# Patient Record
Sex: Female | Born: 1940 | Race: White | Hispanic: Yes | Marital: Married | Smoking: Never smoker
Health system: Southern US, Community
[De-identification: ages and names within clinical notes are randomized; demographics above are authoritative.]

## PROBLEM LIST (undated history)

## (undated) DIAGNOSIS — C8590 Non-Hodgkin lymphoma, unspecified, unspecified site: Secondary | ICD-10-CM

## (undated) DIAGNOSIS — I272 Pulmonary hypertension, unspecified: Secondary | ICD-10-CM

## (undated) DIAGNOSIS — N2581 Secondary hyperparathyroidism of renal origin: Secondary | ICD-10-CM

## (undated) DIAGNOSIS — N186 End stage renal disease: Secondary | ICD-10-CM

## (undated) DIAGNOSIS — I10 Essential (primary) hypertension: Secondary | ICD-10-CM

## (undated) DIAGNOSIS — C679 Malignant neoplasm of bladder, unspecified: Secondary | ICD-10-CM

## (undated) DIAGNOSIS — IMO0001 Reserved for inherently not codable concepts without codable children: Secondary | ICD-10-CM

## (undated) DIAGNOSIS — H547 Unspecified visual loss: Secondary | ICD-10-CM

## (undated) DIAGNOSIS — K769 Liver disease, unspecified: Secondary | ICD-10-CM

## (undated) DIAGNOSIS — IMO0002 Reserved for concepts with insufficient information to code with codable children: Secondary | ICD-10-CM

## (undated) DIAGNOSIS — Z9289 Personal history of other medical treatment: Secondary | ICD-10-CM

## (undated) DIAGNOSIS — Z992 Dependence on renal dialysis: Secondary | ICD-10-CM

## (undated) DIAGNOSIS — K5900 Constipation, unspecified: Secondary | ICD-10-CM

## (undated) DIAGNOSIS — I4891 Unspecified atrial fibrillation: Secondary | ICD-10-CM

## (undated) HISTORY — PX: COLONOSCOPY: SHX174

## (undated) HISTORY — DX: Unspecified atrial fibrillation: I48.91

## (undated) HISTORY — DX: Unspecified visual loss: H54.7

## (undated) HISTORY — DX: Non-Hodgkin lymphoma, unspecified, unspecified site: C85.90

## (undated) HISTORY — PX: AV FISTULA PLACEMENT: SHX1204

## (undated) HISTORY — PX: INSERTION OF DIALYSIS CATHETER: SHX1324

## (undated) HISTORY — PX: FINGER SURGERY: SHX640

## (undated) HISTORY — DX: Pulmonary hypertension, unspecified: I27.20

---

## 2003-02-12 DIAGNOSIS — H547 Unspecified visual loss: Secondary | ICD-10-CM

## 2003-02-12 HISTORY — DX: Unspecified visual loss: H54.7

## 2005-02-11 DIAGNOSIS — C8590 Non-Hodgkin lymphoma, unspecified, unspecified site: Secondary | ICD-10-CM

## 2005-02-11 HISTORY — DX: Non-Hodgkin lymphoma, unspecified, unspecified site: C85.90

## 2005-06-07 ENCOUNTER — Encounter: Payer: Self-pay | Admitting: Cardiology

## 2005-06-07 ENCOUNTER — Inpatient Hospital Stay (HOSPITAL_COMMUNITY): Admission: EM | Admit: 2005-06-07 | Discharge: 2005-06-12 | Payer: Self-pay | Admitting: Family Medicine

## 2005-06-07 ENCOUNTER — Ambulatory Visit: Payer: Self-pay | Admitting: Cardiology

## 2005-06-11 ENCOUNTER — Encounter (INDEPENDENT_AMBULATORY_CARE_PROVIDER_SITE_OTHER): Payer: Self-pay | Admitting: *Deleted

## 2005-07-11 ENCOUNTER — Inpatient Hospital Stay (HOSPITAL_COMMUNITY): Admission: EM | Admit: 2005-07-11 | Discharge: 2005-07-14 | Payer: Self-pay | Admitting: Emergency Medicine

## 2005-07-26 ENCOUNTER — Encounter: Payer: Self-pay | Admitting: Vascular Surgery

## 2005-07-26 ENCOUNTER — Inpatient Hospital Stay (HOSPITAL_COMMUNITY): Admission: EM | Admit: 2005-07-26 | Discharge: 2005-08-06 | Payer: Self-pay | Admitting: Internal Medicine

## 2005-07-30 ENCOUNTER — Encounter: Payer: Self-pay | Admitting: Vascular Surgery

## 2005-10-26 ENCOUNTER — Emergency Department (HOSPITAL_COMMUNITY): Admission: EM | Admit: 2005-10-26 | Discharge: 2005-10-26 | Payer: Self-pay | Admitting: Emergency Medicine

## 2005-10-29 ENCOUNTER — Emergency Department (HOSPITAL_COMMUNITY): Admission: EM | Admit: 2005-10-29 | Discharge: 2005-10-29 | Payer: Self-pay | Admitting: Emergency Medicine

## 2005-11-18 ENCOUNTER — Ambulatory Visit (HOSPITAL_COMMUNITY): Admission: RE | Admit: 2005-11-18 | Discharge: 2005-11-18 | Payer: Self-pay | Admitting: Vascular Surgery

## 2005-12-23 ENCOUNTER — Emergency Department (HOSPITAL_COMMUNITY): Admission: EM | Admit: 2005-12-23 | Discharge: 2005-12-23 | Payer: Self-pay | Admitting: Emergency Medicine

## 2006-01-10 ENCOUNTER — Ambulatory Visit (HOSPITAL_COMMUNITY): Admission: RE | Admit: 2006-01-10 | Discharge: 2006-01-10 | Payer: Self-pay | Admitting: Urology

## 2006-01-10 ENCOUNTER — Encounter (INDEPENDENT_AMBULATORY_CARE_PROVIDER_SITE_OTHER): Payer: Self-pay | Admitting: *Deleted

## 2006-01-10 DIAGNOSIS — C679 Malignant neoplasm of bladder, unspecified: Secondary | ICD-10-CM

## 2006-01-10 HISTORY — DX: Malignant neoplasm of bladder, unspecified: C67.9

## 2006-01-25 ENCOUNTER — Ambulatory Visit: Payer: Self-pay | Admitting: Oncology

## 2006-03-20 ENCOUNTER — Ambulatory Visit: Payer: Self-pay | Admitting: Oncology

## 2008-03-04 ENCOUNTER — Other Ambulatory Visit: Admission: RE | Admit: 2008-03-04 | Discharge: 2008-03-04 | Payer: Self-pay | Admitting: Gynecology

## 2008-03-04 ENCOUNTER — Ambulatory Visit: Payer: Self-pay | Admitting: Gynecology

## 2008-03-04 ENCOUNTER — Encounter: Payer: Self-pay | Admitting: Gynecology

## 2008-03-14 ENCOUNTER — Ambulatory Visit: Payer: Self-pay | Admitting: Gynecology

## 2008-04-12 ENCOUNTER — Ambulatory Visit: Payer: Self-pay | Admitting: Oncology

## 2008-04-18 LAB — CBC WITH DIFFERENTIAL/PLATELET
BASO%: 0.4 % (ref 0.0–2.0)
LYMPH%: 31.8 % (ref 14.0–49.7)
MCHC: 33.8 g/dL (ref 31.5–36.0)
MCV: 98.6 fL (ref 79.5–101.0)
MONO%: 9.5 % (ref 0.0–14.0)
Platelets: 259 10*3/uL (ref 145–400)
RBC: 3.9 10*6/uL (ref 3.70–5.45)
WBC: 5.2 10*3/uL (ref 3.9–10.3)

## 2008-04-19 ENCOUNTER — Ambulatory Visit: Admission: RE | Admit: 2008-04-19 | Discharge: 2008-06-23 | Payer: Self-pay | Admitting: Radiation Oncology

## 2008-05-24 LAB — CBC WITH DIFFERENTIAL/PLATELET
Basophils Absolute: 0 10*3/uL (ref 0.0–0.1)
Eosinophils Absolute: 0.1 10*3/uL (ref 0.0–0.5)
HCT: 28.8 % — ABNORMAL LOW (ref 34.8–46.6)
HGB: 9.9 g/dL — ABNORMAL LOW (ref 11.6–15.9)
MCV: 99.5 fL (ref 79.5–101.0)
MONO%: 10.4 % (ref 0.0–14.0)
NEUT#: 2.2 10*3/uL (ref 1.5–6.5)
NEUT%: 60.1 % (ref 38.4–76.8)
Platelets: 198 10*3/uL (ref 145–400)
RDW: 15.3 % — ABNORMAL HIGH (ref 11.2–14.5)

## 2008-06-01 LAB — CBC WITH DIFFERENTIAL/PLATELET
Basophils Absolute: 0 10*3/uL (ref 0.0–0.1)
Eosinophils Absolute: 0.1 10*3/uL (ref 0.0–0.5)
LYMPH%: 19.2 % (ref 14.0–49.7)
MCH: 33.7 pg (ref 25.1–34.0)
MCV: 99.2 fL (ref 79.5–101.0)
MONO%: 10.3 % (ref 0.0–14.0)
NEUT#: 2.1 10*3/uL (ref 1.5–6.5)
Platelets: 177 10*3/uL (ref 145–400)
RBC: 3.56 10*6/uL — ABNORMAL LOW (ref 3.70–5.45)

## 2008-06-08 ENCOUNTER — Ambulatory Visit: Payer: Self-pay | Admitting: Oncology

## 2008-06-29 LAB — CBC WITH DIFFERENTIAL/PLATELET
BASO%: 0.9 % (ref 0.0–2.0)
EOS%: 4.8 % (ref 0.0–7.0)
HCT: 38.3 % (ref 34.8–46.6)
MCH: 32.8 pg (ref 25.1–34.0)
MCHC: 33.7 g/dL (ref 31.5–36.0)
MONO#: 0.4 10*3/uL (ref 0.1–0.9)
NEUT%: 64.5 % (ref 38.4–76.8)
RBC: 3.93 10*6/uL (ref 3.70–5.45)
RDW: 15.3 % — ABNORMAL HIGH (ref 11.2–14.5)
WBC: 3.4 10*3/uL — ABNORMAL LOW (ref 3.9–10.3)
lymph#: 0.6 10*3/uL — ABNORMAL LOW (ref 0.9–3.3)
nRBC: 0 % (ref 0–0)

## 2008-06-29 LAB — COMPREHENSIVE METABOLIC PANEL
ALT: 12 U/L (ref 0–35)
AST: 19 U/L (ref 0–37)
Alkaline Phosphatase: 108 U/L (ref 39–117)
Creatinine, Ser: 5.57 mg/dL — ABNORMAL HIGH (ref 0.40–1.20)
Sodium: 140 mEq/L (ref 135–145)
Total Bilirubin: 0.5 mg/dL (ref 0.3–1.2)
Total Protein: 7.2 g/dL (ref 6.0–8.3)

## 2008-06-29 LAB — LACTATE DEHYDROGENASE: LDH: 238 U/L (ref 94–250)

## 2008-10-31 ENCOUNTER — Ambulatory Visit: Payer: Self-pay | Admitting: Oncology

## 2008-11-02 LAB — CBC WITH DIFFERENTIAL/PLATELET
BASO%: 0.3 % (ref 0.0–2.0)
EOS%: 3.3 % (ref 0.0–7.0)
LYMPH%: 17.5 % (ref 14.0–49.7)
MCH: 34.2 pg — ABNORMAL HIGH (ref 25.1–34.0)
MCHC: 35.1 g/dL (ref 31.5–36.0)
MONO#: 0.4 10*3/uL (ref 0.1–0.9)
Platelets: 204 10*3/uL (ref 145–400)
RBC: 3.59 10*6/uL — ABNORMAL LOW (ref 3.70–5.45)
WBC: 4.2 10*3/uL (ref 3.9–10.3)

## 2009-04-27 ENCOUNTER — Ambulatory Visit: Payer: Self-pay | Admitting: Oncology

## 2009-05-01 LAB — CBC WITH DIFFERENTIAL/PLATELET
Eosinophils Absolute: 0.2 10*3/uL (ref 0.0–0.5)
LYMPH%: 21.3 % (ref 14.0–49.7)
MONO#: 0.4 10*3/uL (ref 0.1–0.9)
NEUT#: 2.8 10*3/uL (ref 1.5–6.5)
Platelets: 220 10*3/uL (ref 145–400)
RBC: 3.18 10*6/uL — ABNORMAL LOW (ref 3.70–5.45)
WBC: 4.3 10*3/uL (ref 3.9–10.3)
lymph#: 0.9 10*3/uL (ref 0.9–3.3)

## 2009-05-01 LAB — VITAMIN D 25 HYDROXY (VIT D DEFICIENCY, FRACTURES): Vit D, 25-Hydroxy: 28 ng/mL — ABNORMAL LOW (ref 30–89)

## 2009-10-30 ENCOUNTER — Ambulatory Visit: Payer: Self-pay | Admitting: Oncology

## 2009-11-01 ENCOUNTER — Encounter: Admission: RE | Admit: 2009-11-01 | Discharge: 2009-11-01 | Payer: Self-pay | Admitting: Oncology

## 2009-11-01 LAB — CBC WITH DIFFERENTIAL/PLATELET
BASO%: 0.6 % (ref 0.0–2.0)
LYMPH%: 21.3 % (ref 14.0–49.7)
MCHC: 33.6 g/dL (ref 31.5–36.0)
MCV: 101.3 fL — ABNORMAL HIGH (ref 79.5–101.0)
MONO%: 8.9 % (ref 0.0–14.0)
Platelets: 158 10*3/uL (ref 145–400)
RBC: 3.55 10*6/uL — ABNORMAL LOW (ref 3.70–5.45)

## 2009-11-02 LAB — COMPREHENSIVE METABOLIC PANEL
ALT: 14 U/L (ref 0–35)
Alkaline Phosphatase: 77 U/L (ref 39–117)
Glucose, Bld: 122 mg/dL — ABNORMAL HIGH (ref 70–99)
Sodium: 140 mEq/L (ref 135–145)
Total Bilirubin: 0.8 mg/dL (ref 0.3–1.2)
Total Protein: 6.7 g/dL (ref 6.0–8.3)

## 2009-11-02 LAB — FOLATE RBC: RBC Folate: 858 ng/mL — ABNORMAL HIGH (ref 180–600)

## 2009-12-01 ENCOUNTER — Emergency Department (HOSPITAL_COMMUNITY): Admission: EM | Admit: 2009-12-01 | Discharge: 2009-12-02 | Payer: Self-pay | Admitting: Emergency Medicine

## 2009-12-01 ENCOUNTER — Emergency Department (HOSPITAL_COMMUNITY): Admission: EM | Admit: 2009-12-01 | Discharge: 2009-12-01 | Payer: Self-pay | Admitting: Emergency Medicine

## 2010-02-23 ENCOUNTER — Ambulatory Visit: Payer: Self-pay | Admitting: Oncology

## 2010-03-05 ENCOUNTER — Ambulatory Visit (HOSPITAL_COMMUNITY)
Admission: RE | Admit: 2010-03-05 | Discharge: 2010-03-05 | Payer: Self-pay | Source: Home / Self Care | Attending: Oncology | Admitting: Oncology

## 2010-03-05 LAB — CBC WITH DIFFERENTIAL/PLATELET
Basophils Absolute: 0.1 10*3/uL (ref 0.0–0.1)
Eosinophils Absolute: 0.4 10*3/uL (ref 0.0–0.5)
HGB: 11.3 g/dL — ABNORMAL LOW (ref 11.6–15.9)
LYMPH%: 20.4 % (ref 14.0–49.7)
MCV: 102.1 fL — ABNORMAL HIGH (ref 79.5–101.0)
MONO%: 8.8 % (ref 0.0–14.0)
NEUT#: 2.9 10*3/uL (ref 1.5–6.5)
Platelets: 161 10*3/uL (ref 145–400)
RBC: 3.19 10*6/uL — ABNORMAL LOW (ref 3.70–5.45)

## 2010-06-29 NOTE — Discharge Summary (Signed)
[name], [name] NO.:  [id number]   MEDICAL RECORD NO.:  [id number]          PATIENT TYPE:  INP   LOCATION:  4734                         FACILITY:  [facility]   PHYSICIAN:  Lonia Blood, M.D.      DATE OF BIRTH:  06/03/1940   DATE OF ADMISSION:  06/06/2005  DATE OF DISCHARGE:  06/12/2005                                 DISCHARGE SUMMARY   PRIMARY CARE PHYSICIAN:  Dr. Feliciana Rossetti on [facility].   DISCHARGE DIAGNOSES:  1.  Hypertensive urgency.  2.  Acute on chronic renal failure.  3.  Diabetes type 2.  4.  Anemia of possible chronic disease, status post packed red cell      transfusion.  5.  Left pleural effusion, status post thoracentesis.  6.  Intra-abdominal lymphadenopathy with mild hydronephrosis.  7.  Hypothyroidism.   DISCHARGE MEDICATIONS:  1.  Aspirin 81 mg daily.  2.  Lasix 40 mg daily.  3.  Labetalol 200 mg p.o. b.i.d.  4.  Norvasc 10 mg daily.  5.  Clonidine 0.2 mg b.i.d.  6.  Lantus insulin 10 units q.h.s.  7.  Dulcolax 10 mg twice a day.  8.  Glyburide 5 mg daily.  9.  Synthroid 25 mcg daily.   DISPOSITION:  The patient is eager to go home.  She still has workup  pending.  Done thoracentesis looking for cytology due to her intra-abdominal  lymphadenopathy that lymphoma is one of the differentials.  I have discussed  extensively of true intra-peritoneal with the patient and her family  members.  I will call Dr. Quintella Reichert, her primary care doctor, and make an  arrangement so that once her cytology results are back in the next couple of  days we will evaluate it.  If any malignant cells are seen, the patient will  be called from home and further workup will proceed. If not, chances are her  lymphadenopathy is probably reactive and she will need a follow up in three  to four months to get another CT scan and see.  She is also getting a renal  ultrasound today to follow up on previous studies that showed mild left  hydronephrosis.  If the  hydronephrosis has worsened, we will have the  patient see a urologist for further workup.   PROCEDURE:  CT abdomen and pelvis performed on June 07, 2005 that showed  the following:  Bilateral pleural effusions and small pericardial effusion,  retroperitoneal and portal caval adenopathy with a question of lymphoma,  malignancy or infection, mild left hydronephrosis and perinephric edema  without stone which could be secondary to pyelonephritis or partial  obstruction of the left ureter, small amount of free fluid and anasarca.  Water density cyst in the left groin, probably a lymphocele or fluid-filled  hernia sac.  No significant adenopathy or bowel obstruction.  Abdominal  ultrasound performed on June 07, 2005 showed no acute abdominal findings,  no evidence of gallbladder disease and the tail of pancreas is obscured by  overlying gas.  A 2-D echocardiogram on June 07, 2005  showed an EF of 60-  65% and no significant valvular abnormalities.  Chest x-ray on Jun 11, 2005,  after thoracentesis, showed no pneumothorax.  Ultrasound-guided  thoracentesis yielded 740 mL of clear, yellow fluid looking like transudate  with protein of less than 3 gm. LDH of 118, glucose 114 and no bacteria  seen.   CONSULTATIONS:  None.   BRIEF HISTORY:  Please refer to dictated history and physical by Dr. Hannah Beat.  It showed, however, Ms. Tredway is a 70 year old Hispanic female.  She goes in between [place] and [place].  She has a longstanding history of  hypertension and has been diabetic for about 17 years.  She came in to the  ED on the day of admission due to some vague abdominal pain and discomfort.  [facility] prior to being referred here.  The  patient has been taking some medications, especially Micardis from [place].  In the ED, she was found to have a creatinine of 3.2.  Her blood pressure  was elevated in to 200 systolic.  She received IV labetalol at that time but  with   no relief.  The patient was subsequently admitted there for further  management of hypertensive urgency.   HOSPITAL COURSE:  Problem 1:  HYPERTENSIVE URGENCY:  The patient was  initiated on labetalol in the beginning.  Her blood pressure remained  elevated.  Serial addition medications included Lasix, Norvasc and Clonidine  was performed before finally her blood pressure was brought under control.  She is currently on multiple medications as indicated above and her blood  pressure is controlled as of today.  She will need to have further  adjustments of her medicines as an outpatient.   Problem 2:  DIABETES:  The patient's blood sugars were also extremely  elevated in the 200s and 300s while here.  She was initiated on subcutaneous  Lantus which seemed to have helped so far.  Her sugar is now around 100 on  Lantus and the Glyburide.  Metformin was stopped which was something she was  taking prior to arrival.   Problem 3:  RENAL INSUFFICIENCY:  The patient's creatinine was 2.8 on  admission and rose to around 3 and stayed between 3 and 3.3.  This may well  be her baseline from chronic diabetic and hypertensive nephropathy.  I have  consulted nephrology, Dr. Terrial Rhodes. After doing an ultrasound, we  did see the mild left hydronephrosis but that may not be responsible for  this finding.  We will repeat an ultrasound prior to the patient's departure  to see if her hydronephrosis has worsened and then we may get urology  involved.  Otherwise, she will need outpatient follow-up.  Her urine output  is great but the patient may well require close follow-up from a renal  prospective.   Problem 4:  ANEMIA:  The patient's hemoglobin was low, as low as 7.4 on  admission.  She was guaiac negative.  It may have been renal related from  chronic kidney disease.  She received two units of packed red blood cells  and her hemoglobin has stayed close to 12.  She will need to have that followed up  also as an outpatient.   Problem 5:  HYPOTHYROIDISM:  Part of the patient's workup included checking  her TSH.  Her TSH was elevated at 9.166.  She also had workup that showed  iron TIBC of 195, saturations 19.  The ferritin,  however, was 617 showing  that it is not iron deficient.  Subsequent test including free T4 came back  as low at 0.87.  Her T3 was normal at 108.8.  The patient seemed to have had  some form of hypothyroidism which may have contributed to her anemia as  well.  I have started her on 25 mcg of Synthroid and she will need to have  outpatient follow up.   Problem 6:  INTRA-ABDOMINAL LYMPHADENOPATHY:  This is a big issue as  indicated.  Based on the patient's CT scan, the lymphadenopathy could be a  lymphoma or just reactive.  The nodes are not easily reachable for biopsy;  however, since she had pleural effusion, we have tapped her pleural fluids.  So far, everything looks like a transudate.  We are, however, waiting for  cytology.  Once we get the report, if there is any malignant cells, then the  patient will require further aggressive workup.  If not, the patient will  need follow-up CT scan in three to four months to see if the lymphadenopathy  is still.  The patient is asymptomatic and feeling great.   Problem 7:  HYPOKALEMIA:  The patient has had recurrent hypokalemia which we  have treated prior to discharge.   DISCHARGE LABORATORY DATA:  White count 5.5, hemoglobin 11.8, platelets  266,000.  Sodium 143, potassium 3.5, chloride 106, CO2 28, BUN 46,  creatinine 3.3, glucose 106, calcium 8.5.      Lonia Blood, M.D.  Electronically Signed     LG/MEDQ  D:  06/12/2005  T:  06/12/2005  Job:  [id number]

## 2010-06-29 NOTE — Discharge Summary (Signed)
[name], [name]                  ACCOUNT NO.:  [id number]   MEDICAL RECORD NO.:  [id number]          PATIENT TYPE:  INP   LOCATION:  3305                         FACILITY:  [facility]   PHYSICIAN:  Kela Millin, M.D.DATE OF BIRTH:  11/06/1940   DATE OF ADMISSION:  07/11/2005  DATE OF DISCHARGE:  07/14/2005                                 DISCHARGE SUMMARY   DISCHARGE DIAGNOSES:  1.  Malignant hypertension.  2.  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  3.  Anemia - secondary to number one.  4.  Diabetes mellitus.  5.  Retroperitoneal adenopathy - follow up CT scan of abdomen and pelvis in      three months recommended - upon followup with primary care physician -      Dr. Feliciana Jordan.  6.  Hypothyroidism.   STUDIES:  1.  CT scan of chest - edema in the upper abdomen and retroperitoneum, ?      pancreatitis.  Stable, enlarged, retroperitoneal lymph nodes.  A      slightly decreased, small to moderate, left effusion and resolved,      small, right effusion.  2.  CT scan of the neck - no evidence of acute abnormality.  3.  24-hour urine - a 24-hour protein 6.8 gm and creatinine clearance of 0      ml per minute.   CONSULTATIONS:  Nephrology, Dr. Arlean Hopping.   HISTORY:  The patient is a 70 year old [name] female, who was brought to  the ER after she saw a physician assistant at the general medical clinic on  [employer].  She had her blood pressure checked there and it was noted  to be 238/94 and was referred to the ER.  It was noted that patient has been  admitted to [facility] on the [facility] several weeks  ago for hypertensive emergency.  The patient is non-English speaking, and  information was obtained through a telephone interpreter.  The patient's  daughter-in-law was present and assisted with some of the history.  The  patient reported that she felt dizzy on the morning of admission  but  reported that she felt better in the ER.  It was stated that she might have  stopped her medicines a few days prior, but she usually took her medicines  regularly.  She denied headaches, shortness of breath, and no chest pain.   The physical exam on admission per Dr. Lendell Caprice revealed a blood pressure of  187/90 initially, 224/88, a pulse of 54, respiratory rate of 20, and the  pertinent findings on the neck exam was supple and there was 1.5 cm  submandibular lymph node bilaterally noted, and also on the extremities +2  to 3 pitting edema bilaterally.  The rest of her physical exam was reported  to be within normal limits.   On the laboratory data, her hemoglobin 10.3, hematocrit 29.2, and chest x-  ray showed a mild, stable, diffuse, peribronchial thickening, left basilar  atelectasis/scarring/infiltrate, blunting of the  left costophrenic angle, no  cardiomegaly and no edema.  Her urinalysis showed a specific gravity of  1.010, glucose of 100, total protein greater than 300, 0 to 2 WBCs, 7 to 10  RBCs, and hyaline casts noted.  Her sodium was 139, potassium 4.8, chloride  112, BUN 48, glucose 100, pH 7.0, bicarb 22, creatinine 4.3.  Her white cell  count was 6.6.   (   ASSESSMENT AND PLAN BY PROBLEM:  ) HOSPITAL COURSE  1.  Malignant hypertension - upon admission, the patient was placed on a      nitroglycerin drip and was started on her oral medications.  The doses      of her oral medications were adjusted and then she was subsequently      weaned off the nitroglycerin drip.  The patient's Clonidine was      increased to 0.3 mg p.o. b.i.d., and she was maintained on the Atenolol      100 mg daily, and she was also continued on the Lasix 40 mg a day.      Initially, Hydralazine was added to better control her blood pressure,      but upon further review of her old chart from previous hospitalization,      it was noted that patient had previously been discharged on Norvasc.       The patient was thus put back on Norvasc instead.  She remained      asymptomatic throughout her hospital stay and will continue Clonidine,      Lasix, Norvasc, and Atenolol upon discharge.  The patient had been on      Micardis when she was admitted, but because of her worsening renal      failure the Micardis was discontinued.  She has been instructed not to      take any more Micardis upon discharge.  2.  Stage IV chronic kidney disease - upon admission, Micardis was      discontinued as stated above, a 24-hour urine was also done and the      results as stated above - a total protein of 6.8 gm and a creatinine      clearance of 9 ml per minute.  Nephrology was consulted and saw the      patient.  It was noted that she had had an ultrasound done during her      last hospitalization, which showed no hydronephrosis.  Dr. Arlean Hopping      followed the patient while she was in the hospital and explained the      need to prepare her for dialysis - need for AV graft,  and the patient      stated that she did not want dialysis when she gets to the point that      she needs it.  She refused the surgery for AV graft, and she is to      follow up with Dr. Eliott Nine upon discharge.  The patient's BUN and      creatinine today prior to discharge are 63 and 4.5.  3.  Retroperitoneal adenopathy - this had been diagnosed during the      patient's previous hospitalization of May 2nd and the recommendation was      for her to have a followup CT of abdomen and pelvis in three months.      The patient had a CT of the chest this hospital stay and it also showed  the retroperitoneal adenopathy, which was stable, and the patient is to      follow up with Dr. Quintella Reichert, whom family states that she followed up with      the last time she was discharged from the hospital - the patient is to      have a followup CT scan of the abdomen and pelvis to reevaluate this     adenopathy in three months.  There was a question  of submandibular      adenopathy on Dr. Pincus Sanes admission physical exam, and she had a CT      scan of the neck done to further evaluate this, and the CT scan showed      no abnormalities.  4.  Diabetes mellitus - the patient's Accu-Cheks were monitored and she was      maintained on Glyburide during her hospital stay.  5.  Hypothyroidism - the patient had a thyroid function test rechecked upon      admission and this showed a TSH of 10.14.  Her T4 was 6.1 and the T3      uptake 38.8.  The patient is to continue her Synthroid upon discharge      and follow up with her primary care physician.  6.  Peripheral edema - was noted that patient had edema on exam upon      admission.  This resolved with her Lasix.  It also was noted that      patient had had a two-D echo done during her May 2nd hospitalization and      it showed a normal ejection fraction with no valvular abnormalities.   DISCHARGE MEDICATIONS:  1.  Clonidine 0.3 mg p.o. b.i.d.  2.  Lasix 40 mg p.o. daily.  3.  The patient is to continue Micardis.  4.  Norvasc 10 mg p.o. daily.  5.  Atenolol 100 mg p.o. daily.  6.  MiraLax 17 gm daily, hold for diarrhea.  7.  The patient is to continue her levothyroxine.   FOLLOWUP CARE:  1.  Dr. Eliott Nine at [facility], [phone].  2.  Dr. Feliciana Jordan in one week.   DISCHARGE DIET:  Two-gram sodium, modified carbohydrates.   DISCHARGE CONDITION:  Improved/stable.      Kela Millin, M.D.  Electronically Signed     ACV/MEDQ  D:  07/14/2005  T:  07/14/2005  Job:  [id number]   cc:   Lacretia Leigh. Quintella Reichert, M.D.  [address]  [place] [address]   [facility] Salvia. Eliott Nine, M.D.  Fax: [phone]

## 2010-06-29 NOTE — Discharge Summary (Signed)
[name], [name]               ACCOUNT NO.:  [id number]   MEDICAL RECORD NO.:  [id number]          PATIENT TYPE:  INP   LOCATION:  5525                         FACILITY:  [facility]   PHYSICIAN:  Wilber Bihari. Caryn Section, M.D.   DATE OF BIRTH:  06-25-1940   DATE OF ADMISSION:  07/26/2005  DATE OF DISCHARGE:  08/06/2005                                 DISCHARGE SUMMARY   DISCHARGE DIAGNOSES:  1.  Hypertension.  2.  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  The patient had low-      grade temperatures during her hospital stay.  Urine cultures and blood      cultures were obtained, all of which remained negative.  The patient was      placed on empiric antibiotics initially; however, they were later      discontinued secondary to the negative culture results.  3.  Diabetes mellitus.  4.  Anemia.  5.  Secondary hyperparathyroidism.  6.  Hypothyroidism.   PROCEDURES:  1.  July 26, 2005, right intrajugular dialysis catheter placement by Dr.      Waverly Ferrari.  2.  July 30, 2005, upper extremity vein mapping.  Impression:  Left cephalic      vein is easily compressible, without thickened walls.  3.  August 02, 2005, left upper extremity arteriovenous fistula placement by      Dr. Liliane Bade.   CONSULTATIONS:  Cardiovascular and thoracic surgery.   HISTORY OF PRESENT ILLNESS:  This is a 70 year old Hispanic female who is  near end-stage renal disease with a serum creatinine of 4.9 secondary to  hypertension.  She was recently hospitalized from May 31 to July 14, 2005,  due to an elevated blood pressure of 238/94.  We recommended hemodialysis at  that time; however she refused.  She presented to the [facility] office today for a hospital followup with a serum creatinine of  4.9 and potassium 6.6.  She refused hospitalization yesterday.  She stated,  with the help with an interpreter, that she wanted to speak with the rest of  her family.  She was given a  prescription for Kayexalate 50 g p.o. x 1 and  arranged for intrajugular catheter placement to occur on July 26, 2005.  She  presented to the office today with an interpreter to get further direction  from [place].  She has agreed to go to the hospital today  with an elevated blood pressure of 240/110.  Since her discharge on July 14, 2005, she has not been taking blood pressure medications secondary to  nausea.  She denies any shortness of breath or cough at this time but does  have fatigue and nausea without vomiting.   ADMISSION LABORATORY DATA:  Sodium 141, potassium 3.9, chloride 108, CO2 22,  glucose 120, BUN 52, creatinine 4.8, albumin 2.3, calcium 8.4, and  phosphorus 7.4.  WBC 6.9, hemoglobin 12.7, hematocrit 36.5, platelets 315.  PT 14.2, INR 1.1, PTT 27.  Hep B surface antigen negative.   DIAGNOSTIC RADIOLOGICAL EXAMINATIONS:  1.  July 26, 2005, one-view chest  x-ray.  Impression:  Good position of the      new right dialysis catheter with the tips in the right atrium and      superior vena cava, just above the right atrium.  There is no      pneumothorax.  There are noted bilateral pleural effusions which have      developed since the previous study.  There is cardiomegaly and mild      edema.  2.  August 01, 2005, two-view chest x-ray.  Impression:  Improved aeration.      Persistent bilateral effusions with left basilar opacity remaining.  3.  August 05, 2005, two-view chest x-ray.  Stable bilateral pleural      effusions, left side greater than right, stable left lower lobe      atelectasis versus consolidation.   HOSPITAL COURSE:  1.  Hypertension.  The patient was admitted with a significantly elevated      blood pressure.  Antihypertensive medications were immediately      administered, and ultrafiltration with hemodialysis on the day of      admission as well, both of which produced some mild improvement.  During      the patient's hospitalization,  antihypertensive medications were      increased, and ultrafiltration continued with hemodialysis treatment.      At the time of discharge, the patient's systolic blood pressure was      fair, ranging from 130 to 180.  The blood pressure will need to be      followed closely at the outpatient hemodialysis center, and medication      and/or ultrafiltration adjustments made as needed.  2.  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  At the time of      admission, a Diatek catheter was placed, with hemodialysis immediately      afterward.  Hemodialysis was performed throughout the patient's hospital      stay, and the patient seemed to tolerate the treatment fairly well, with      a gradual increase in blood flow rate, treatment time, and      ultrafiltration.  At the time of discharge, the patient's treatment time      had increased to 4 hours, blood flow rate of 400, and an ultrafiltration      average of 2-3 L.  A chest x-ray was performed prior to discharge, which      revealed bilateral pleural effusions.  The patient's estimated dry      weight will need to be challenged on an outpatient basis and followed      closely.  3.  Diabetes mellitus.  The patient's oral medication regimen was continued      per preadmission, and she was also placed on sliding-scale insulin with      Accu-Cheks a.c. and h.s.  During her hospital stay, blood glucoses were      fairly well controlled, ranging in the low to mid 100s.  4.  Anemia.  Aranesp therapy was initiated at the time of admission, as well      as iron therapy.  Patient remained without active signs of bleeding, and      at the time of discharge, the patient's hemoglobin was 9.0 and      hematocrit 26.5.  5.  Secondary hyperparathyroidism.  During the patient's hospital stay, her      phosphorus level was elevated.  Phosphate binder therapy was initiated,     which  the patient tolerated well and without difficulty.  The patient       required no vitamin D therapy at this time.  At the time of discharge,      the patient's calcium was 8.1 and phosphorus 4.6.  6.  Hypothyroidism.  The patient's levothyroxine therapy was continued per      her preadmission regimen, which she tolerated well and without      difficulty.   DISCHARGE MEDICATIONS:  1.  Levothyroxine 100 mcg, 1 p.o. daily.  2.  Glyburide 5 mg 1 p.o. daily.  3.  Nephro-Vite 1 p.o. daily.  4.  Tums Ultra 1 pill t.i.d. with meals.  5.  Amlodipine 10 mg, 1 p.o. nightly.  6.  Labetalol 400 mg p.o. b.i.d.; hold mornings of hemodialysis, on Mondays,      Wednesdays, and Fridays.   DISCHARGE INSTRUCTIONS:  Activity as tolerated.  Patient is to resume a  renal diabetic diet with a 1200-mL fluid restriction.  The patient is to  sponge bathe only, with no showers or swimming at this time.  She must keep  her catheter site dry.  The patient is to arrive at the [facility] tomorrow morning at 11:00 for hemodialysis.  Her outpatient  hemodialysis schedule will be Mondays, Wednesdays, and Fridays, second  shift.  She is instructed to bring a blanket, pillow, and someone who speaks  English with her to interpret.   HEMODIALYSIS MEDICATIONS:  Venofer 50 mg IV weekly with hemodialysis.  Epogen 29,500 units IV every treatment with hemodialysis.   HEMODIALYSIS INSTRUCTIONS:  Treatment time is 4 hours.  Patient will be  placed on an OPTI180, and a dialysate type of 2 K, 2.5 calcium.  Estimated  dry weight of 48.0 kg; however, the patient will need to be ultra filtrated  for 3-4 L and then rounding Asher Babilonia to evaluate and establish an estimated  dry weight.  Blood flow rate 400.  Dialysate flow rate 800.  Patient is to  be placed on standard heparin with 148 linear sodium.  Dialysis is to take  place via a right IJ dialysis catheter.  We will be unable to use the AV  fistula for approximately 8-12 weeks.  However, it does need to be assessed  with each  treatment for thrill and bruit.  Accu-Cheks are to be performed  with each treatment and a hemoglobin A1c with monthly lab.  Please follow up  on final blood culture results that were drawn on August 05, 2005.  Please  also note that the patient does not speak English and that these discharge  instructions have been reviewed with the patient with the help of an  interpreter.      Tracey P. Sherrod, NP    ______________________________  Wilber Bihari. Caryn Section, M.D.    TPS/MEDQ  D:  08/06/2005  T:  08/06/2005  Job:  [id number]   cc:   [facility]   [facility]   Di Kindle. Edilia Bo, M.D.  [address]  [place]  [place] [address]   P. Liliane Bade, M.D.  [address]  [place]  [place] [address]

## 2010-06-29 NOTE — Op Note (Signed)
[name], [name]               ACCOUNT NO.:  [id number]   MEDICAL RECORD NO.:  [id number]          PATIENT TYPE:  AMB   LOCATION:  DAY                          FACILITY:  [facility]   PHYSICIAN:  Excell Seltzer. Annabell Howells, M.D.    DATE OF BIRTH:  Dec 02, 1940   DATE OF PROCEDURE:  01/10/2006  DATE OF DISCHARGE:                               OPERATIVE REPORT   PROCEDURE:  Cystoscopy, bilateral retrograde pyelograms with  interpretation and TURBT of a large bladder neck mass.   PREOPERATIVE DIAGNOSIS:  Bladder neck mass with hematuria.   POSTOPERATIVE DIAGNOSIS:  Bladder neck mass with hematuria.   SURGEON:  Dr. Bjorn Pippin.   ANESTHESIA:  General.   SPECIMEN:  Tumor chips.   DRAINS:  22-French Foley catheter.   COMPLICATIONS:  None.   INDICATIONS:  Ms. Fritcher is a 70 year old Hispanic female who was sent  in consultation for gross hematuria.  She was found to have a donut-  shaped mass at the bladder neck suspicious for neoplasm and comes in  today for biopsy.  She has renal failure so contrast was not used at CT,  so retrograde pyelograms will be performed as well to complete her  evaluation.   FINDINGS/PROCEDURE:  The patient was given IV Cipro.  She was taken to  the operating room where she was fitted with PAS hose.  She was given a  general anesthetic and placed in lithotomy position. Her perineum and  genitalia were prepped with Betadine solution and she was draped in the  usual sterile fashion.  Bimanual examination demonstrated a mobile mass  in the area of the bladder neck consistent with findings on office cysto  and the exam.   Cysto was performed using a 22-French scope and 12 and 70 degrees  lenses. The examination revealed a normal mid and distal urethra but at  the proximal urethra there was a submucosal mass effect with abnormal  appearing overlying mucosa that extended from approximately the 7  o'clock position around anteriorly to the 5 o'clock position. The only  other area that appeared to be spared was at 6 o'clock. The ureteral  orifices were unremarkable and uninvolved. The bladder wall had minimal  trabeculation without other tumors noted.   Retrograde pyelography was performed bilaterally with a 5-French [place]  open-ended catheter and contrast.   The right retrograde pyelogram demonstrated a normal ureter and  intrarenal collecting system.   The left retrograde pyelogram demonstrated a normal ureter and  intrarenal collecting system.   The 28-French continuous flow resectoscope sheath was inserted and this  was fitted with an Wandra Scot handle and 12 degree loop and resection of  the mass was then performed. I resected the lesion around its full  extent which was greater than 5 cm x 2 cm and tried to remove the bulky  intrusion into the bladder. I was not able to resect down to normal  tissue. 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   After the resection had been completed, the chips were removed and  hemostasis was  achieved with fulguration.  Final inspection revealed  intact ureteral orifices, no retained chips or active bleeding.  The  scope was removed and a 22-French Foley catheter was inserted.  The  balloon was filled with 10 mL sterile fluid.  The catheter was irrigated  until clear and then placed to straight drainage. The patient was taken  down the lithotomy position, her anesthetic was reversed, she was moved  to the recovery room in stable condition and there were no  complications.      Excell Seltzer. Annabell Howells, M.D.  Electronically Signed     JJW/MEDQ  D:  01/10/2006  T:  01/11/2006  Job:  [id number]   cc:   Wilber Bihari. Caryn Section, M.D.  Fax: [phone]

## 2010-06-29 NOTE — Op Note (Signed)
[name], [name]               ACCOUNT NO.:  [id number]   MEDICAL RECORD NO.:  [id number]          PATIENT TYPE:  INP   LOCATION:  5525                         FACILITY:  [facility]   PHYSICIAN:  Balinda Quails, M.D.    DATE OF BIRTH:  Jul 11, 1940   DATE OF PROCEDURE:  08/02/2005  DATE OF DISCHARGE:                                 OPERATIVE REPORT   SURGEON:  Denman George, MD.   ASSISTANT:  Nurse.   ANESTHETIC:  Local with MAC.   PREOPERATIVE DIAGNOSIS:  Chronic renal insufficiency.   POSTOPERATIVE DIAGNOSIS:  Chronic renal insufficiency.   PROCEDURE:  Left brachiocephalic arteriovenous fistula.   OPERATIVE PROCEDURE:  The patient was brought to the operating room in  stable condition.  Placed in supine position.  The left arm prepped and  draped in a sterile fashion.  The skin and subcutaneous tissue instilled  with 1% Xylocaine with epinephrine.  A transverse skin incision made through  the left antecubital fossa.  Dissection was carried down through  subcutaneous tissue.  The left cephalic vein identified.  This was  mobilized. 4 mm in size.  The vein ligated with 2-0 silk distally and  divided.  Dilated with heparin saline solution and controlled with a bulldog  clamp.  The brachial artery then freed and encircled with a Vesseloop.  The  patient administered 2000 units of heparin intravenously.  The brachial  artery controlled proximally and distally with a bulldog clamp.  A  longitudinal arteriotomy made.  The vein divided at appropriate length,  anastomosed end-to-side to the brachial artery using running 7-0 Prolene  suture.  Clamps are then removed.  Excellent flow present.  Adequate  hemostasis obtained.  Sponge and instrument counts correct.   The subcutaneous tissue closed with running 3-0 Vicryl suture.  Skin closed  with 4-0 Monocryl.  Steri-Strips applied.  The patient tolerated the  procedure well.  Transferred to the recovery room in stable  condition.      Balinda Quails, M.D.  Electronically Signed     PGH/MEDQ  D:  08/02/2005  T:  08/02/2005  Job:  [id number]

## 2010-06-29 NOTE — Op Note (Signed)
[name], [name]                  ACCOUNT NO.:  [id number]   MEDICAL RECORD NO.:  [id number]          PATIENT TYPE:  INP   LOCATION:  1829                         FACILITY:  [facility]   PHYSICIAN:  Di Kindle. Edilia Bo, M.D.DATE OF BIRTH:  11/02/1940   DATE OF PROCEDURE:  07/26/2005  DATE OF DISCHARGE:                                 OPERATIVE REPORT   PREOPERATIVE DIAGNOSIS:  Chronic renal failure.   POSTOPERATIVE DIAGNOSIS:  Chronic renal failure.   PROCEDURE:  Ultrasound-guided placement of a right IJ Diatek catheter.   SURGEON:  Di Kindle. Edilia Bo, M.D.   ANESTHESIA:  Local with sedation technique.  The patient was taken to the  operating room and sedated by anesthesia.  The ultrasound scanner was used  to identify both internal jugular veins, both of which appeared to be patent  and were marked.  The neck and upper chest were then prepped and draped in  the usual sterile fashion.  After the skin was infiltrated with 1%  lidocaine, the right IJ was cannulated and a guidewire introduced into the  superior vena cava under fluoroscopic control.  The tract over the wire was  dilated and then a dilator and peel-away sheath were passed over the wire  and the wire and dilator removed.  The catheter was passed through the peel-  away sheath and positioned in the right atrium.  The exit site for the  catheter was selected and the skin anesthetized between the two areas.  The  catheter was then brought through the tunnel, cut to the appropriate length  and the distal ports were attached.  Both ports withdrew easily.  We then  flushed with heparinized saline and filled with concentrated heparin.  The  catheter was secured at its exit site with a 3-0 nylon suture.  The IJ  cannulation site was closed with 4-0 subcuticular stitch.  A sterile  dressing was applied.  The patient tolerated the procedure well and was  transferred to the recovery room in satisfactory condition.  All needle  and  sponge counts were correct.      Di Kindle. Edilia Bo, M.D.  Electronically Signed     CSD/MEDQ  D:  07/26/2005  T:  07/26/2005  Job:  [id number]

## 2010-06-29 NOTE — H&P (Signed)
[name], [name]                  ACCOUNT NO.:  [id number]   MEDICAL RECORD NO.:  [id number]          PATIENT TYPE:  INP   LOCATION:  3305                         FACILITY:  [facility]   PHYSICIAN:  Corinna L. Lendell Caprice, MDDATE OF BIRTH:  06/20/1940   DATE OF ADMISSION:  07/11/2005  DATE OF DISCHARGE:                                HISTORY & PHYSICAL   CHIEF COMPLAINT:  High blood pressure, dizzy.   HISTORY OF PRESENT ILLNESS:  [name] is a 70 year old [place] female who  was brought to the emergency room after having seen a P.A. at the [facility] on [address].  She had had a blood pressure there of  238/94 and was referred to the emergency room.  She was admitted to the  [facility] hospitalists several weeks ago for hypertensive urgency.  She  speaks no [place], and I have obtained this information via the interpreter  telephone.  She is here with her daughter-in-law who assists with some of  the history.  She reports that she felt dizzy this morning.  She feels  better now.  She reports that she may have stepped her medications a few  days ago, but reports that she takes her medications usually quite  regularly.  She has no headache, no shortness of breath, no chest pain.   PAST MEDICAL HISTORY:  1.  Hypertension.  2.  Chronic renal failure during her last hospitalization her.  Her      creatinine was 3.3.  3.  Type 2 diabetes.  4.  Elevated TSH during her last hospitalization, for which Synthroid was      started.  5.  Chronic constipation.  6.  Anemia of chronic disease with transfusion requirement during the last      hospitalization.  7.  History of left pleural effusion and thoracentesis during her last      hospitalization.  Cytology was negative for malignancy.  It was a      transudate.  8.  History of abnormal CAT scan showing lymphadenopathy   MEDICATIONS:  1.  Lasix 40 mg a day.  2.  Clonidine 0.2 mg p.o. b.i.d.  3.  Micardis 40 mg a day.  4.   Atenolol 100 mg a day.  5.  Glyburide 5 mg a day.  6.  Synthroid has been increased to 100 mcg a day.   SOCIAL HISTORY:  Apparently she spends part of her time in [place], part of  the time in [place] with family members.  There is no drinking, smoking  or drug history.  She does not work.   FAMILY HISTORY:  Her father had heart problems.   PAST SURGICAL HISTORY:  None.   REVIEW OF SYSTEMS:  As above; otherwise, negative.   PHYSICAL EXAMINATION:  VITAL SIGNS:  Her temperature is 96.5, blood pressure  224/88, pulse 54, respiratory rate 18, oxygen saturation 100% on room air.  GENERAL:  The patient appears older than her stated age.  She is in no acute  distress.  HEENT:  Normocephalic, atraumatic.  Pupils equal, round, reactive  to light.  Her fundi are difficult to visualize, but no obvious pathology.  Sclerae  nonicteric.  She has moist mucous membranes.  Oropharynx is without erythema  or exudate.  NECK:  Supple.  She has about a 1.5 to 2 cm submandibular lymph node  bilaterally.  LUNGS:  Clear to auscultation bilaterally without wheezes, rhonchi or rales.  CARDIOVASCULAR:  Regular rate and rhythm without murmurs, gallops or rubs.  ABDOMEN:  Normal bowel sounds, soft, nontender, nondistended.  GU/RECTAL:  Deferred.  EXTREMITIES:  She has 2 to 3+ pitting edema bilaterally.  Pulses are intact.  SKIN:  No rash.  PSYCHIATRIC:  The patient is calm and cooperative.  NEUROLOGIC:  Alert and oriented.  Cranial nerves and sensory motor exam are  grossly intact.  LYMPHATICS:  As above.  She had no palpable axillary or groin lymph nodes.  BREAST EXAM:  No lumps or dimpling of the skin.  RECTAL:  She has heme negative stool during her last hospitalization and  reportedly unremarkable rectal exam.   LABORATORY DATA:  CBC is significant for hemoglobin of 10.3, hematocrit of  29.2.  On Jun 12, 2005, her hemoglobin was 11.8.  Sodium 139, potassium 4.8,  chloride 112, bicarbonate 22, glucose  of 100, BUN 48, creatinine 4.3.  On  Jun 12, 2005, her hemoglobin A1c was 4.7.  Liver function tests significant  for an albumin of 1.8 and a total protein of 4.3.  BNP on June 07, 2005 was  338.  Her TSH was 9.116.  Her free T4 was minimally diminished at 0.87.  T3  total was normal at 108.  Iron was 48.  TIBC was 195.  Percent saturation  was 19.  Ferritin 617.  UA shows specific gravity 1.010.  Ketones negative,  bilirubin negative, large blood, greater than 300 protein, negative nitrite,  negative leukocyte esterase, 0-2 white cells, 7-10 red cells.  EKG shows  sinus bradycardia with a rate of about 45, age indeterminate septal  infarction.  Chest x-ray shows mild stable diffuse peribronchial thickening,  left basilar atelectasis/scarring/infiltrate, blunting at the left  costophrenic angle, no cardiomegaly or edema.   ASSESSMENT/PLAN:  1.  Hypertensive urgency:  The patient initially was started on a Nipride      drip by the ED physician.  I will switch this to a nitroglycerin drip      due to her risk of cyanide toxicity, particularly with her renal      failure.  I will also increase her clonidine.  I will start hydralazine,      continue atenolol.  She will be admitted to the step-down unit.  2.  Acute-on-chronic renal insufficiency:  I suspect this may be related to      her high blood pressure and also Micardis.  She had an ultrasound of the      abdomen on Jun 12, 2005 which showed no hydronephrosis.  She had      apparently had a CT of the abdomen and pelvis without contrast prior      that showed mild hydro on the left, echogenic kidneys.  She, according      to the discharge summary, had been seen by Dr. Arrie Aran.  I do not see      a consult note in the computer.  I will ask for the old chart.  There is      no urgent indication for hemodialysis.  I will hold the Micardis and  monitor.  I will also check a 24-hour urine for creatinine. 3.  Lymphadenopathy on abdominal  CAT scan last month:  Her CAT scan showed      retroperitoneal and portacaval adenopathy, question lymphoma, malignancy      or infection.  I do palpate some submandibular lymph nodes, and I will      check a CT of the neck and chest without contrast.  She may need biopsy      of any superficial pathologic lymph nodes.  If there is no concern, the      plan previously was to repeat the CAT scan of the abdomen and pelvis in      3 months, which would be a good plan.  4.  Diabetes.  Continue outpatient medications and monitor blood glucose.  5.  Peripheral edema:  She had an echocardiogram during her last      hospitalization which showed a normal ejection fraction and no      significant valvular abnormalities.  She does have greater than 300      protein on her UA, and I will check a 24-hour urine for protein to      further evaluate.  I will continue her Lasix.  She did have a low      albumin during her last hospitalization, but no cholesterol drawn.      Certainly, nephrotic syndrome would be in the differential.  6.  Anemia of chronic disease:  This will be monitored.  7.  Abnormal TSH during her last hospitalization.  She had a fairly      unremarkable T4 and free T3 which makes me concerned that this may have      been euthyroid sick syndrome.  I will decrease her Synthroid to 25 mcg.      Apparently, somebody had increased it to 100 as an outpatient.  I will      repeat the TSH and free T4, free T3.   Total time on the day of admission is 90 minutes.      Corinna L. Lendell Caprice, MD  Electronically Signed     CLS/MEDQ  D:  07/11/2005  T:  07/11/2005  Job:  [id number]   cc:   Lacretia Leigh. Quintella Reichert, M.D.  [address]  [place]  [place] [address]

## 2010-06-29 NOTE — Consult Note (Signed)
NAMECHARLYE, [name]:  [id number]   MEDICAL RECORD NO.:  [id number]          PATIENT TYPE:  INP   LOCATION:  3305                         FACILITY:  [facility]   PHYSICIAN:  Maree Krabbe, M.D.DATE OF BIRTH:  [birth year]   DATE OF CONSULTATION:  07/12/2005  DATE OF DISCHARGE:                                   CONSULTATION   IMPRESSION:  1.  Mild acute on chronic renal failure, likely due to blood pressure      control with empiric autoregulation versus progression of underlying      disease versus, and less likely, overdiuresis.  2.  Chronic kidney disease.  Creatinine 3.3 in April 2007, stage III-IV due      to hypertension and/or diabetes.  3.  Proteinuria, greater than 300 on urinalysis.  Has not been quantitated.  4.  Questionable hydronephrosis by computerized tomograph in April 2007,      which was not present on the subsequent ultrasound on May 2.  5.  Hypertension of 15 years' duration, with hypertensive urgency.  Blood      pressure stable on current regimen, which includes hydralazine,      clonidine, beta blocker, IV nitroglycerin and Lasix.  The ARB has been      held (Micardis).  6.  History of intra-abdominal lymphadenopathy.  Workup in progress.  7.  Diabetes mellitus of over 15 years' duration.  8.  Anemia, due to chronic kidney disease most likely.  9.  Hypothyroidism.  10. Hypoalbuminemia, probably related to nephrotic syndrome.   RECOMMENDATIONS:  1.  Agree with quantitating proteinuria.  2.  Agree with current blood pressure management.  If compliance is an      issue, however, clonidine may be problematic.  3.  Would hold ACE inhibitor and the ARB therapy for now.  Will consider      after proteinuria is quantitated, and creatinine is stabilized.  4.  Blood pressure goal is to be systolic 125-130 or less and diastolic 70-      75 or less.  5.  Check iron stores and start Aranesp weekly subcu.  6.  Would not pursue hydronephrosis  further at this time.  Last ultrasound      showed no evidence of hydronephrosis.  7.  Will need followup with nephrologist after discharge.  8.  Will check hepatitis B and C serologies and ANA.   HISTORY:  The patient is a 70 year old Hispanic female admitted here for the  first time in April with renal insufficiency, creatinine 3.3, and  uncontrolled hypertension.  She has stated that she has had diabetes and  hypertension for 17 years.  She does not speak [place].  She was discharged  on May 2 with good blood pressure control, labetalol 200 mg b.i.d., Lasix 40  mg daily, Norvasc 10 mg daily, clonidine 0.2 mg b.i.d. and some other  medications.  She was admitted yesterday on May 31 for uncontrolled  hypertensive urgency, systolic over 200 and diastolic around 90, and her  current medication list includes Lasix, Micardis, atenolol and clonidine in  addition to other medications.  During her April admission, she had some  problems with anasarca and pleural effusions and intra-abdominal  lymphadenopathy, which is being evaluated.  She also had mild left  hydronephrosis on a CT scan, but the follow-up ultrasound showed no  hydronephrosis.   Renal workup has included a renal ultrasound which showed remarkably  echogenic kidneys bilaterally, with renal size of 10.6 on the right and 10.8  on the left.  Other studies include a urinalysis with greater than 300  protein and 7-10 red cells per high-powered field.  The patient was admitted  with elevated BUN and creatinine from her baseline, and we were asked to see  her in this regard.  There is a question of whether she had been seen by Dr.  Arrie Aran on the last admission, but it appears that she was not.  There  was no consult in the paper chart or in the computer.  Currently, the  patient has no complaints.  Blood pressure has been controlled with IV  nitroglycerin and is currently 140/60.  The patient is alert and oriented  and denies any  recent problems with shortness of breath, swelling,  difficulty voiding or hematuria.   PAST MEDICAL HISTORY:  Hypertension and diabetes of 17 years' duration.  Chronic renal insufficiency as above.  She has not been on insulin therapy  for her diabetes that I can tell.  She denies any history of previous  surgeries.   MEDICATIONS ON ADMISSION:  1.  Glyburide.  2.  Lasix 40 mg a day.  3.  Levothyroxine.  4.  Micardis 40 mg daily.  5.  Atenolol 100 mg daily.  6.  Clonidine 0.2 mg b.i.d.   CURRENT MEDICATIONS:  1.  Tenormin 100 mg daily.  2.  Catapres 0.3 mg b.i.d.  3.  Lasix 40 mg daily by mouth.  4.  Glyburide.  5.  Hydralazine 10 mg q.i.d.  6.  Synthroid.  7.  P.r.n. hydralazine IV.  8.  IV nitroglycerin.   SOCIAL HISTORY:  The patient does not smoke or drink.  She moves back and  forth between here and [place].  She spends about half her time in the [place].  She has a daughter and a son who are in [place].  Her husband  lives in [place].   FAMILY HISTORY:  Mother alive at age 21 and healthy.  Father died at age 89  of unknown cause.   REVIEW OF SYSTEMS:  Unremarkable.   PHYSICAL EXAMINATION:  VITAL SIGNS:  Blood pressure 140/80.  Vital signs  otherwise stable.  GENERAL APPEARANCE:  The patient is a healthy-appearing older female in no  distress.  SKIN:  Without rash.  HEENT:  PERRLA.  EOMI.  Throat is clear.  NECK:  Supple without flat neck veins.  CHEST:  Clear throughout.  CARDIAC:  Regular rate and rhythm without murmur, rub or gallop.  ABDOMEN:  Soft and nontender without masses or ascites.  EXTREMITIES:  No peripheral edema.  Good pulses in the feet.  NEUROLOGIC:  Grossly nonfocal motor exam.  No asterixis.   LABORATORY:  Hemoglobin 8.6.  Sodium 138, potassium 4.7, chloride 109, CO2  22, BUN 56, creatinine 4.2.      Maree Krabbe, M.D.  Electronically Signed    RDS/MEDQ  D:  07/12/2005  T:  07/13/2005  Job:  [id number]

## 2010-06-29 NOTE — H&P (Signed)
NAME:  REA, MA                      ACCOUNT NO.:  [id number]   MEDICAL RECORD NO.:  [id number]          PATIENT TYPE:  INP   LOCATION:  1823                         FACILITY:  [facility]   PHYSICIAN:  Hettie Holstein, D.O.    DATE OF BIRTH:  05-13-1940   DATE OF ADMISSION:  06/07/2005  DATE OF DISCHARGE:                                HISTORY & PHYSICAL   HISTORY OF PRESENTING ILLNESS:  [name] is a pleasant 70 year old Hispanic  female who resides between [place] and the [place], who has a history  of hypertension and she states diabetes x17 years, for which she states she  has been taking pills for this for quite some time, though this is not  included in her bag of medications.  In any event, she has had recent lab  work at the offices of Dr. Feliciana Jordan, at which time her hemoglobin A1c  was 4.9.  In any event, in the emergency department she had complaints of  vague abdominal pain and discomfort.  She was referred here from Urgent Care  because of abnormal labs revealing a creatinine of 3.2.  She brings a bag of  predominantly medications which she had prescribed in [place].  In any event,  she was evaluated by Physician Assistant [name] and discovered to have  elevated blood pressure and hypertensive urgency; she was given labetalol  and clonidine without response on the first dose and she is going to be  admitted for further control.  A rectal examination was performed in the  emergency department by the physician's assistant because of low hemoglobin  of 8.8 and this was Hemoccult-negative.   PAST MEDICAL HISTORY:  [name] is a limited historian.  Her son is here to  provide with assistance.  She denies any previous history of surgeries.  She  does report having hypertension and diabetes for 17 years and states that  she takes a pill for this; they do not have this with her at this time.  Last hemoglobin A1c was 4.9 on recent lab work done by Dr. Quintella Jordan.   MEDICATIONS:  1.  Micardis 40 mg daily while she was in [place], though recently was given      samples for Micardis HCT 80/12.5 mg that she is to take daily.  2.  Dicyclomine 20 mg p.o. q.6 h. p.r.n. recently prescribed.  3.  Aciphex half a tablet every day.  4.  Furosemide on a p.r.n. basis.   ALLERGIES:  She has no known drug allergies.   SOCIAL HISTORY:  She lives back and forth between here and [place].  Her son  is reachable at [phone].  Her husband resides in [place].  She does not  drink or smoke.   FAMILY HISTORY:  Mother is alive at age 46 and healthy.  Father died at age  78 of unknown causes, as per family.   REVIEW OF SYSTEMS:  She denies any coffee-grounds emesis, melena or dysuria.  She does have intermittent swelling of her lower extremities.  Otherwise,  further review of  systems is unremarkable.   PHYSICAL EXAMINATION:  VITAL SIGNS:  On physical examination in the  department, her systolic was noticed to be quite elevated over 200 despite  IV labetalol x1 dose; we anticipate repeating the dose here shortly.  She  was afebrile.  GENERAL:  The patient appears in no acute distress.  CARDIOVASCULAR:  Exam reveals a normal S1 and S2 and there is a mid-systolic  murmur along the right sternal border without radiation to the carotids.  LUNGS:  Clear bilaterally.  ABDOMEN:  Minimally tender.  EXTREMITIES:  Bipedal edema.   LABORATORY DATA:  Her hemoglobin is 8.8, BUN 37, creatinine 2.8, glucose  106.  Urinalysis did reveal greater than 300 mg/dl of protein.  Lipase was  21.  Albumin was 1.8.   ASSESSMENT:  1.  Renal failure.  2.  Hypertensive urgency.  3.  History of diabetes.  4.  Anemia, suspect of chronic disease.  5.  Left pleural effusion on x-ray.  6.  Low albumin.   PLAN:  At this time, we are going to admit [name] to a telemetry floor and  administer IV labetalol to manage her hypertensive urgency and initiate q.12  h. dosing and hold her nephrotoxic agents including  Micardis HCT.  We will  follow her clinical course and initiate diuretics.  We will check spot  urines for the possibility of prerenal status, place a Foley and follow her  I&O's closely.      Hettie Holstein, D.O.  Electronically Signed     ESS/MEDQ  D:  06/07/2005  T:  06/07/2005  Job:  [id number]   cc:   [name], M.D.  [address]  [place]  [place] [address]

## 2010-06-29 NOTE — H&P (Signed)
NAMERAMONICA, GRIGG NO.:  [id number]   MEDICAL RECORD NO.:  [id number]          PATIENT TYPE:  EMS   LOCATION:  MAJO                         FACILITY:  [facility]   PHYSICIAN:  Garnetta Buddy, M.D.   DATE OF BIRTH:  12/21/1940   DATE OF ADMISSION:  07/26/2005  DATE OF DISCHARGE:                                HISTORY & PHYSICAL   HISTORY OF PRESENT ILLNESS:  Mrs. Toenjes is a 70 year old [place] woman who  is near end-stage renal disease with a serum creatinine of 4.9 due to  hypertension.  She was recently hospitalized from Jul 11, 2005, to July 14, 2005, due to a blood pressure of 238/94.  We recommended hemodialysis at  that time but she refused.   She presented yesterday to our office for a hospital follow-up with a serum  creatinine of 4.9 and potassium of 6.6.  She refused hospitalization  yesterday.  She stated with the help of an interpreter that she wanted to  speak with the rest of her family.  I gave her a prescription of Kayexalate  50 g p.o. x1 and arranged for IJ catheter placement to occur on July 26, 2005.  She presents today with the interpreter to get further direction from  [place].  She presents today with the interpreter to get further direction from  [place].  She has finally agreed to go to the hospital today given her blood  pressure of 240/110.  Since discharged on July 14, 2005, taking blood  pressure medications have been difficult due to nausea.  Today she denies  shortness of breath or cough but still has fatigue and nausea without  vomiting.   ALLERGIES:  NO KNOWN DRUG ALLERGIES.   MEDICATIONS:  1.  Levothyroxine 100 mcg p.o. daily.  2.  Hydralazine 10 mg p.o. q.i.d.  3.  Furosemide 80 mg p.o. b.i.d.  4.  Clonidine 0.3 mg p.o. b.i.d.  5.  Atenolol 100 mg p.o. daily.  6.  Glyburide 5 mg p.o. q.a.m.   PAST MEDICAL HISTORY:  1.  Hypertension.  2.  Chronic kidney disease, creatinine 3.3 in April of 2007, stage III to IV      due to  hypertension or diabetes per Dr. Arlean Hopping.  3.  Questionable hydronephrosis on CT in April 2007, but no hydronephrosis      seen on ultrasound on Jun 12, 2005.  4.  Hypertension x15 years.  5.  History of intra-abdominal lymphadenopathy on CT scan during May 2007,      hospitalization. Recommended follow-up in three months by primary care      physician Lacretia Leigh. Quintella Reichert, M.D.  6.  Hypothyroidism.  7.  A 24-hour urine revealed 24-hour protein of 6.8 g and creatinine      clearance of 0 mL per minute during Jul 11, 2005, July 14, 2005,      hospitalization.   FAMILY HISTORY:  Positive for heart disease and diabetes, negative for  kidney disease.   SOCIAL HISTORY:  [place] woman who speaks no [place].  Lives with family  here  in [place], [place].  Continues to visit family in [place] on  a regular basis.  She is married and her husband is here with her.  She  denies tobacco use or alcohol or illicit drug use.   REVIEW OF SYSTEMS:  See HPI.  Positive for anorexia, fatigue, vision changes  and peripheral edema.  Negative for headache, chest pain, palpitations,  claudication, vomiting, diarrhea, hematochezia, dysuria, hematuria, joint  pain, rash, pruritus or seizure.   LABORATORY DATA TODAY:  None.   PHYSICAL EXAMINATION:  GENERAL APPEARANCE:  Elderly [place] frail woman in  no acute distress.  VITAL SIGNS:  Blood pressure 240/110, heart rate 64, respirations 18, weight  133 pounds.  HEENT:  Eyes:  Blurring of optic margins.  Pupils are equal, round, reactive  to light and accommodation.  Sclerae are anicteric.  NECK:  No lymphadenopathy, bruits or JVD.  LUNGS:  Crackles but no wheezes or rales.  CARDIOVASCULAR:  Regular rate and rhythm without clicks, gallops or rubs.  ABDOMEN:  Positive bowel sounds, soft, slightly distended.  EXTREMITIES:  There is +3 pitting bilateral lower extremities.  BACK:  There is +1 pitting edema at sacrum.  NEUROLOGIC:  Negative for asterixis.   Cranial nerves are intact without  focal deficit.   ASSESSMENT/PLAN:  1.  Hypertensive urgency.  Has taken Norvasc 10 mg with sips of water in our      office today but will also give Labetalol 200 mg in the emergency      department, then b.i.d.  2.  Uremia.  CVTS to place Perma-Cath this afternoon.  Will check a renal      now in the emergency department.  If potassium is still too high, will      need temporary groin catheter by Dr. Caryn Section.  He is aware.  Patient states      she took Kayexalate 50 g p.o. x1 last night and has several bowel      movements since.  3.  Diabetes mellitus type 2.  Will give her glyburide 5 mg when eating.      Her hemoglobin A1c in June 08, 2005, was 4.7.  4.  Hypothyroidism.  5.  Continue levothyroxine 100 mcg p.o. daily.  6.  Anemia.  Check CBC when she gets to the emergency department.  Give      Aranesp 100 mcg subcu each week.  Iron source stable since checked on      July 13, 2005.  7.  Renal osteodystrophy.  Phosphorus high.  Will give Tums with each meal.      Intact PTH normal on July 13, 2005.  No need for vitamin D at this point.  8.  Disposition:  [facility] office is aware of her need for outpatient      hemodialysis placement.  __________ called today with slot.  Home with      family after discharge.      Hillery Hunter, PA      Garnetta Buddy, M.D.  Electronically Signed    MJG/MEDQ  D:  07/26/2005  T:  07/26/2005  Job:  [id number]   cc:   Lacretia Leigh. Quintella Reichert, M.D.  [address]  [place]  [place] [address]   [facility]

## 2011-05-01 ENCOUNTER — Emergency Department (INDEPENDENT_AMBULATORY_CARE_PROVIDER_SITE_OTHER)
Admission: EM | Admit: 2011-05-01 | Discharge: 2011-05-01 | Disposition: A | Discharge status: Home / Self Care | Payer: Medicare Other | Source: Home / Self Care | Attending: Family Medicine | Admitting: Family Medicine

## 2011-05-01 ENCOUNTER — Encounter (HOSPITAL_COMMUNITY): Payer: Self-pay

## 2011-05-01 DIAGNOSIS — R42 Dizziness and giddiness: Secondary | ICD-10-CM

## 2011-05-01 DIAGNOSIS — R739 Hyperglycemia, unspecified: Secondary | ICD-10-CM

## 2011-05-01 DIAGNOSIS — R7309 Other abnormal glucose: Secondary | ICD-10-CM

## 2011-05-01 LAB — POCT I-STAT, CHEM 8
Creatinine, Ser: 4.7 mg/dL — ABNORMAL HIGH (ref 0.50–1.10)
Glucose, Bld: 302 mg/dL — ABNORMAL HIGH (ref 70–99)
Hemoglobin: 11.9 g/dL — ABNORMAL LOW (ref 12.0–15.0)
Potassium: 4.6 mEq/L (ref 3.5–5.1)

## 2011-05-01 LAB — CBC
HCT: 32.1 % — ABNORMAL LOW (ref 36.0–46.0)
Hemoglobin: 11.1 g/dL — ABNORMAL LOW (ref 12.0–15.0)
MCH: 33.7 pg (ref 26.0–34.0)
MCV: 97.6 fL (ref 78.0–100.0)
RBC: 3.29 MIL/uL — ABNORMAL LOW (ref 3.87–5.11)

## 2011-05-01 LAB — DIFFERENTIAL
Eosinophils Absolute: 0.2 10*3/uL (ref 0.0–0.7)
Lymphs Abs: 1.2 10*3/uL (ref 0.7–4.0)
Monocytes Relative: 8 % (ref 3–12)
Neutrophils Relative %: 56 % (ref 43–77)

## 2011-05-01 NOTE — Discharge Instructions (Signed)
Su azucar esta alta. Evite comer alimentos dulces.  Debe tomar su dosis regular de glyburide esta noche y repetir Neomia Dear dosis extra en la manana despues de desayunar. Debe decir en su centro de hemodyalisis que le chequeen la azucar y que esta teniendo [place]. Debe ir con su medico primario para seguimiento de la diabetes. Debe ir al departamento de emergencia si continua con mareos o si empeoran sus sintomas.

## 2011-05-01 NOTE — ED Notes (Addendum)
Per pt, spouse; has been having HA, dizzy for couple of days ; dialysis pt, denies n/v/d; denies palpitations, chills, fever

## 2011-05-03 NOTE — ED Provider Notes (Signed)
History     CSN: [id number]  Arrival date & time 05/01/11  7829   First MD Initiated Contact with Patient 05/01/11 1835      Chief Complaint  Patient presents with  . Dizziness    (Consider location/radiation/quality/duration/timing/severity/associated sxs/prior treatment) HPI Comments: 71 y/o female with multiple comorbidities including DM, ESRD on hemodialysis. Here with family c/o dizziness. States her dizziness is worse after HD treatments. Has been feeling like this for about 1 week but worse in last 2 days. She has been partially blind for long time and denies worsening or changes in her vision. Last HD yesterday. Takes 5mg  glyburide for diabetes once a day reports her doctor started her on insuline about 3 months ago but she is not currently using insulin. Denies headache, chest pain, shortness or breath, dysuria, fever or chills. No nausea, vomiting or diarrhea. No abdominal pain.    Past Medical History  Diagnosis Date  . Cancer   . Renal disorder     Past Surgical History  Procedure Date  . Bladder cancer   . Renal failure     History reviewed. No pertinent family history.  History  Substance Use Topics  . Smoking status: Not on file  . Smokeless tobacco: Not on file  . Alcohol Use:     OB History    Grav Para Term Preterm Abortions TAB SAB Ect Mult Living                  Review of Systems  Constitutional: Negative for fever, chills and diaphoresis.  HENT: Negative for congestion, rhinorrhea and mouth sores.   Respiratory: Negative for cough and shortness of breath.   Cardiovascular: Negative for chest pain, palpitations and leg swelling.  Gastrointestinal: Negative for nausea, vomiting, abdominal pain and diarrhea.  Genitourinary: Negative for dysuria and flank pain.  Musculoskeletal: Negative for joint swelling, arthralgias and gait problem.  Skin: Negative for rash.  Neurological: Positive for dizziness. Negative for tremors, seizures, syncope,  speech difficulty, weakness, numbness and headaches.    Allergies  Review of patient's allergies indicates no known allergies.  Home Medications   Current Outpatient Rx  Name Route Sig Dispense Refill  . AMLODIPINE BESYLATE 10 MG PO TABS Oral Take 10 mg by mouth daily.    Marland Kitchen LABETALOL HCL 100 MG PO TABS Oral Take 100 mg by mouth 2 (two) times daily.    Marland Kitchen SEVELAMER CARBONATE 800 MG PO TABS Oral Take 800 mg by mouth 3 (three) times daily with meals.      BP 206/41  Pulse 56  Temp(Src) 98.5 F (36.9 C) (Oral)  Resp 18  SpO2 96%  Physical Exam  Nursing note and vitals reviewed. Constitutional: She is oriented to person, place, and time. She appears well-developed and well-nourished. No distress.  HENT:  Head: Normocephalic and atraumatic.  Right Ear: External ear normal.  Left Ear: External ear normal.  Mouth/Throat: Oropharynx is clear and moist.       Upper and lower dentures. Gums are clear and moist.  Eyes: Conjunctivae and EOM are normal. Pupils are equal, round, and reactive to light. No scleral icterus.  Neck: Normal range of motion. Neck supple.  Cardiovascular: Normal rate, regular rhythm and normal heart sounds.   Pulmonary/Chest: Effort normal and breath sounds normal. No respiratory distress. She has no wheezes. She has no rales. She exhibits no tenderness.  Abdominal: Soft. She exhibits no distension. There is no tenderness.  Musculoskeletal: She exhibits no edema.  Left arm AV shunt patent with normal thrill. No erythema or the skin around.   Lymphadenopathy:    She has no cervical adenopathy.  Neurological: She is alert and oriented to person, place, and time. She has normal strength and normal reflexes. No cranial nerve deficit or sensory deficit. She displays a negative Romberg sign.  Skin: No rash noted.    ED Course  Procedures (including critical care time)  Labs Reviewed  CBC - Abnormal; Notable for the following:    WBC 3.9 (*)    RBC 3.29 (*)      Hemoglobin 11.1 (*)    HCT 32.1 (*)    Platelets 139 (*)    All other components within normal limits  POCT I-STAT, CHEM 8 - Abnormal; Notable for the following:    BUN 48 (*)    Creatinine, Ser 4.70 (*)    Glucose, Bld 302 (*)    Hemoglobin 11.9 (*)    HCT 35.0 (*)    All other components within normal limits  DIFFERENTIAL  LAB REPORT - SCANNED   No results found.   1. Dizziness   2. Hyperglycemia       MDM  71 y/o diabetic female with ESRD on HD complaining of dizziness. Impress non toxic, no signs of infection. Hyperglycemic here 300s. Likely dizziness related to uncontrolled diabetes worsened by volume depletion during HD. Unsure about who is following her diabetes. Decided to transfer patient to the emergency department. Patient family declined transfer. I reccommended to take her glyburide tonight as usual and take an extra pill in am and asked to have her sugar rechecked tomorrow during HD and make a follow up apointment with her kidney doctor to talk about her symptoms and diabetes treatment.         Sharin Grave, MD 05/03/11 1415

## 2011-05-05 ENCOUNTER — Encounter (HOSPITAL_COMMUNITY): Payer: Self-pay | Admitting: *Deleted

## 2011-05-05 ENCOUNTER — Emergency Department (HOSPITAL_COMMUNITY): Payer: Medicare Other

## 2011-05-05 ENCOUNTER — Inpatient Hospital Stay (HOSPITAL_COMMUNITY)
Admission: EM | Admit: 2011-05-05 | Discharge: 2011-05-06 | DRG: 149 | Disposition: A | Discharge status: Home / Self Care | Payer: Medicare Other | Source: Ambulatory Visit | Attending: Internal Medicine | Admitting: Internal Medicine

## 2011-05-05 DIAGNOSIS — M949 Disorder of cartilage, unspecified: Secondary | ICD-10-CM | POA: Diagnosis present

## 2011-05-05 DIAGNOSIS — I10 Essential (primary) hypertension: Secondary | ICD-10-CM

## 2011-05-05 DIAGNOSIS — Z8551 Personal history of malignant neoplasm of bladder: Secondary | ICD-10-CM

## 2011-05-05 DIAGNOSIS — R42 Dizziness and giddiness: Principal | ICD-10-CM | POA: Diagnosis present

## 2011-05-05 DIAGNOSIS — E119 Type 2 diabetes mellitus without complications: Secondary | ICD-10-CM | POA: Diagnosis present

## 2011-05-05 DIAGNOSIS — M899 Disorder of bone, unspecified: Secondary | ICD-10-CM | POA: Diagnosis present

## 2011-05-05 DIAGNOSIS — Z992 Dependence on renal dialysis: Secondary | ICD-10-CM

## 2011-05-05 DIAGNOSIS — J189 Pneumonia, unspecified organism: Secondary | ICD-10-CM

## 2011-05-05 DIAGNOSIS — D649 Anemia, unspecified: Secondary | ICD-10-CM | POA: Diagnosis present

## 2011-05-05 DIAGNOSIS — I120 Hypertensive chronic kidney disease with stage 5 chronic kidney disease or end stage renal disease: Secondary | ICD-10-CM | POA: Diagnosis present

## 2011-05-05 DIAGNOSIS — R531 Weakness: Secondary | ICD-10-CM

## 2011-05-05 DIAGNOSIS — K5900 Constipation, unspecified: Secondary | ICD-10-CM | POA: Diagnosis present

## 2011-05-05 DIAGNOSIS — N186 End stage renal disease: Secondary | ICD-10-CM | POA: Diagnosis present

## 2011-05-05 DIAGNOSIS — Z23 Encounter for immunization: Secondary | ICD-10-CM

## 2011-05-05 DIAGNOSIS — J9819 Other pulmonary collapse: Secondary | ICD-10-CM | POA: Diagnosis present

## 2011-05-05 HISTORY — DX: Essential (primary) hypertension: I10

## 2011-05-05 HISTORY — DX: Dependence on renal dialysis: N18.6

## 2011-05-05 HISTORY — DX: Malignant neoplasm of bladder, unspecified: C67.9

## 2011-05-05 HISTORY — DX: Dependence on renal dialysis: Z99.2

## 2011-05-05 LAB — CBC
Hemoglobin: 11.7 g/dL — ABNORMAL LOW (ref 12.0–15.0)
MCH: 34.2 pg — ABNORMAL HIGH (ref 26.0–34.0)
MCV: 99.7 fL (ref 78.0–100.0)
RBC: 3.42 MIL/uL — ABNORMAL LOW (ref 3.87–5.11)

## 2011-05-05 LAB — CARDIAC PANEL(CRET KIN+CKTOT+MB+TROPI)
Relative Index: INVALID (ref 0.0–2.5)
Total CK: 27 U/L (ref 7–177)
Troponin I: <0.3 ng/mL (ref <0.30)

## 2011-05-05 LAB — BASIC METABOLIC PANEL
BUN: 37 mg/dL — ABNORMAL HIGH (ref 6–23)
CO2: 26 mEq/L (ref 19–32)
Calcium: 10.9 mg/dL — ABNORMAL HIGH (ref 8.4–10.5)
Creatinine, Ser: 4.01 mg/dL — ABNORMAL HIGH (ref 0.50–1.10)
Glucose, Bld: 302 mg/dL — ABNORMAL HIGH (ref 70–99)

## 2011-05-05 LAB — GLUCOSE, CAPILLARY

## 2011-05-05 LAB — HEMOGLOBIN A1C: Hgb A1c MFr Bld: 8.5 % — ABNORMAL HIGH (ref <5.7)

## 2011-05-05 MED ORDER — INSULIN ASPART 100 UNIT/ML ~~LOC~~ SOLN
0.0000 [IU] | Freq: Three times a day (TID) | SUBCUTANEOUS | Status: DC
  Administered 2011-05-05: 5 [IU] via SUBCUTANEOUS
  Administered 2011-05-06 (×2): 2 [IU] via SUBCUTANEOUS
  Administered 2011-05-06: 3 [IU] via SUBCUTANEOUS

## 2011-05-05 MED ORDER — LABETALOL HCL 200 MG PO TABS
400.0000 mg | ORAL_TABLET | Freq: Two times a day (BID) | ORAL | Status: DC
  Administered 2011-05-05 – 2011-05-06 (×2): 400 mg via ORAL
  Filled 2011-05-05 (×3): qty 2

## 2011-05-05 MED ORDER — SORBITOL 70 % PO SOLN
60.0000 mL | Freq: Two times a day (BID) | ORAL | Status: DC | PRN
  Filled 2011-05-05: qty 60

## 2011-05-05 MED ORDER — VANCOMYCIN HCL IN DEXTROSE 1-5 GM/200ML-% IV SOLN
1000.0000 mg | Freq: Once | INTRAVENOUS | Status: AC
  Administered 2011-05-05: 1000 mg via INTRAVENOUS
  Filled 2011-05-05: qty 200

## 2011-05-05 MED ORDER — SODIUM CHLORIDE 0.9 % IJ SOLN
3.0000 mL | Freq: Two times a day (BID) | INTRAMUSCULAR | Status: DC
  Administered 2011-05-05 – 2011-05-06 (×2): 3 mL via INTRAVENOUS

## 2011-05-05 MED ORDER — ACETAMINOPHEN 325 MG PO TABS: 650.0000 mg | ORAL_TABLET | Freq: Four times a day (QID) | ORAL | Status: DC | PRN

## 2011-05-05 MED ORDER — PIPERACILLIN-TAZOBACTAM 3.375 G IVPB
3.3750 g | Freq: Once | INTRAVENOUS | Status: AC
  Administered 2011-05-05: 3.375 g via INTRAVENOUS
  Filled 2011-05-05: qty 50

## 2011-05-05 MED ORDER — SODIUM CHLORIDE 0.9 % IJ SOLN: 3.0000 mL | INTRAMUSCULAR | Status: DC | PRN

## 2011-05-05 MED ORDER — ALUM & MAG HYDROXIDE-SIMETH 200-200-20 MG/5ML PO SUSP
30.0000 mL | Freq: Four times a day (QID) | ORAL | Status: DC | PRN
  Filled 2011-05-05: qty 30

## 2011-05-05 MED ORDER — ASPIRIN EC 81 MG PO TBEC
81.0000 mg | DELAYED_RELEASE_TABLET | Freq: Every day | ORAL | Status: DC
  Administered 2011-05-05 – 2011-05-06 (×2): 81 mg via ORAL
  Filled 2011-05-05 (×2): qty 1

## 2011-05-05 MED ORDER — MORPHINE SULFATE 4 MG/ML IJ SOLN: 4.0000 mg | INTRAMUSCULAR | Status: DC | PRN

## 2011-05-05 MED ORDER — ONDANSETRON HCL 4 MG/2ML IJ SOLN: 4.0000 mg | Freq: Four times a day (QID) | INTRAMUSCULAR | Status: DC | PRN

## 2011-05-05 MED ORDER — ONDANSETRON HCL 4 MG PO TABS: 4.0000 mg | ORAL_TABLET | Freq: Four times a day (QID) | ORAL | Status: DC | PRN

## 2011-05-05 MED ORDER — SODIUM CHLORIDE 0.9 % IV SOLN: 250.0000 mL | INTRAVENOUS | Status: DC | PRN

## 2011-05-05 MED ORDER — HEPARIN SODIUM (PORCINE) 5000 UNIT/ML IJ SOLN
5000.0000 [IU] | Freq: Three times a day (TID) | INTRAMUSCULAR | Status: DC
  Administered 2011-05-05 – 2011-05-06 (×4): 5000 [IU] via SUBCUTANEOUS
  Filled 2011-05-05 (×6): qty 1

## 2011-05-05 MED ORDER — AMLODIPINE BESYLATE 10 MG PO TABS
10.0000 mg | ORAL_TABLET | Freq: Every day | ORAL | Status: DC
  Administered 2011-05-06: 10 mg via ORAL
  Filled 2011-05-05: qty 1

## 2011-05-05 MED ORDER — MECLIZINE HCL 12.5 MG PO TABS
12.5000 mg | ORAL_TABLET | Freq: Three times a day (TID) | ORAL | Status: DC | PRN
  Filled 2011-05-05: qty 1

## 2011-05-05 MED ORDER — ACETAMINOPHEN 650 MG RE SUPP: 650.0000 mg | Freq: Four times a day (QID) | RECTAL | Status: DC | PRN

## 2011-05-05 MED ORDER — LABETALOL HCL 5 MG/ML IV SOLN
20.0000 mg | Freq: Once | INTRAVENOUS | Status: AC
  Administered 2011-05-05: 20 mg via INTRAVENOUS
  Filled 2011-05-05: qty 4

## 2011-05-05 NOTE — ED Notes (Signed)
EMS-EMS called for CBG of 300, son states that pt has been complaining of not feeling well for the last couple days, pt was at urgent care on the 20th.

## 2011-05-05 NOTE — ED Notes (Signed)
Son at bedside for translation, pt is spanish speaking only. Reports pt has not been feeling well for the last couple days, went to urgent care and was given insulin, pt does not usually take insulin. Son reports pt has had general fatigue and not feeling well for last couple days. Dialysis graft to left arm, tues thurs sat with full dose of dialysis yesterday. Reports pt has been constipated and took stool softner this am, pt had large bowel movement this am. Son checked sugar this am and was 300. EMS reports CBG 300.

## 2011-05-05 NOTE — ED Notes (Signed)
[phone] 

## 2011-05-05 NOTE — H&P (Signed)
Hospital Admission Note Date: 05/05/2011  Patient name: [name] Medical record number: [id number] Date of birth: Jul 22, 1940 Age: 71 y.o. Gender: female PCP: No primary provider on file.  Medical Service: Internal Medicine Teaching Service- Maurice March  Attending physician: Dr. Rogelia Boga    1st Contact: Dr. Milbert Coulter   Pager: [phone] 2nd Contact: Dr. Dorthula Rue   Pager: [phone] After 5 pm or weekends: 1st Contact:      Pager: [phone] 2nd Contact:      Pager: [phone]  Chief Complaint: Dizziness  History of Present Illness: Patient is a 71 yo spanish speaking woman with PMH significant for HTN, DM and ESRD that presents with acute onset dizziness x 1 week that is worse with positional changes and better when staying still.  She was seen in urgent care for this problem on 05/01/11.  At that time she noted that dizziness is worse after HD.  At urgent care, it was thought that dizziness was related to uncontrolled DM worsened by vol depletion during HD.  She was advised to transfer to ED, but family declined transfer. She returned today (with grandson at bedside) after having an episode of near syncope this morning.  She denies LOC, but notes that she was alone when she felt faint.  She reports feeling diaphoretic without palpitations, and thought symptoms were due to low blood sugars, so she tried to eat candy.  She reports significant polyuria this morning as well.  She denies N/V/fever/chest pain/tinnitus.  She notes that she felt the room was spinning prior to feeling faint this morning. She has never felt similar symptoms in the past.   Of note, she attends HD on a TTS schedule (last HD was yesterday) in [place], but she does not know the location.    Meds: Amlodipine Besylate 10mg  PO daily Glyburide 5mg  PO daily Labetalol HCL 400mg  PO BID Sorbitol PO BID prn constipation  Allergies: Review of patient's allergies indicates no known allergies.  Past Medical History  Diagnosis Date    . Cancer   . ESRD on dialysis     dialysis  . Hypertension   . Diabetes mellitus    Past Surgical History  Procedure Date  . Bladder cancer   . Renal failure    Family History  Problem Relation Age of Onset  . Thyroid disease Mother    History   Social History  . Marital Status: Married    Spouse Name: N/A    Number of Children: N/A  . Years of Education: N/A   Occupational History  . Not on file.   Social History Main Topics  . Smoking status: Never Smoker   . Smokeless tobacco: Not on file  . Alcohol Use: No  . Drug Use:   . Sexually Active:    Other Topics Concern  . Not on file   Social History Narrative  . 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 rate 18, SpO2 93.00%. Vital signs reviewed. Wide pulse pressure noted. Supine: 216/50 HR 61   Standing: 229/52 HR 63 General: resting  in bed, no acute distress, cooperative with exam HEENT: no scleral icterus, no conjunctival pallor, no nystagmus with dix hall pike, but difficult to perform since patient felt dizzy with mvmt, normal TM Cardiac: RRR, systolic murmur best heard at right 2nd intercostal space Pulm: clear to auscultation bilaterally, moving normal volumes of air Abd: soft, nontender, nondistended, BS normoactive Ext: warm and well perfused, no pedal edema, venous stasis skin changes in b/l LE, dialysis access with palpable thrill in LUE Neuro: alert and oriented X3, no focal deficits, visual  deficiency in b/l eyes   Lab results: Basic Metabolic Panel:  Basename 05/05/11 1156  NA 137  K 4.2  CL 94*  CO2 26  GLUCOSE 302*  BUN 37*  CREATININE 4.01*  CALCIUM 10.9*  MG --  PHOS --   CBC:  Basename 05/05/11 1156  WBC 4.1  NEUTROABS --  HGB 11.7*  HCT 34.1*  MCV 99.7  PLT 158   CBG:  Basename 05/05/11 1116  GLUCAP 289*   Imaging results:  Dg Chest 2 View  05/05/2011  *RADIOLOGY REPORT*  Clinical Data: Hypoglycemia  CHEST - 2 VIEW  Comparison: 03/05/2010; 01/10/2006; 08/01/2005  Findings: Grossly unchanged and enlarged cardiac silhouette and mediastinal contours.  There is persistent cephalization of flow with indistinct pulmonary vasculature.  Grossly unchanged small left-sided effusion and left basilar opacities.  No new focal airspace opacity.  No definite pleural effusion.  Grossly unchanged bones.  IMPRESSION: 1.  Grossly unchanged findings of cardiomegaly and pulmonary edema.  2. Grossly unchanged left-sided effusion and left basilar opacities, atelectasis versus infiltrate.  A follow-up chest radiograph in 4 to 6 weeks after treatment is recommended to ensure resolution.  Original Report Authenticated By: Waynard Reeds, M.D.   Ct Head Wo Contrast  05/05/2011  *RADIOLOGY REPORT*  Clinical Data: Hyperglycemia.  Dizziness.  Hypertension.  CT HEAD WITHOUT CONTRAST  Technique:  Contiguous axial images were obtained from the base of the skull through the vertex without contrast.  Comparison: Next CT of 07/11/2005  Findings: Bone windows demonstrate hyperostosis frontalis interna. Clear paranasal sinuses and mastoid air cells.  Soft tissue windows demonstrate cerebral and cerebellar atrophy. Mild ventriculomegaly, felt to be secondary.  Remote left basil ganglia lacunar infarct.  Mild low density in the periventricular white matter likely related to small vessel disease.Scattered tiny calcifications are identified within the  cerebral hemispheres bilaterally.  No  significant mass effect or surrounding edema.  Otherwise, no  mass lesion, hemorrhage, hydrocephalus, acute infarct, intra-axial, or extra-axial fluid collection.  IMPRESSION:  1. No acute intracranial abnormality. 2.  Cerebral atrophy and small vessel ischemic change 3.  Scattered cerebral hemisphere tiny calcifications.  Although nonspecific, chronic cysticercosis cannot be excluded.  Original Report Authenticated By: Consuello Bossier, M.D.    Other results: EKG: NSR with 1st degree AVB, TWI in V6  Assessment & Plan by Problem:  #Dizziness: Description of dizziness (room spinning), suggests etiology such as BPPV.  Given wide pulse pressure, cardiomegaly and murmur heard on exam critical aortic stenosis is on the differential, but patient had normal aortic valve on echo in 2007 (with EF 60-65%).  Though unlikely, symptoms may be due to acute MI without CP since patient is diabetic, likely uncontrolled.  Cardiac arrhythmias may cause symptoms as well.   Hb is not significantly low, so unlikely 2/2 anemia. No recent cough/fever and history does not suggest straining with BM, so unlikely that patient became vagal.  UTI may present this way in the  elderly patient.  CT head does not suggest acute process such as stroke. Orthostatic vital signs are negative. -Admit to tele -CE x 1 -TTE -meclizine trial -Vestibular rehab (PT) -UA with reflex micro  #Atelectasis versus infiltrate on CXR: Initially thought to be 2/2 PNA so was treated with 1 dose of vanc/zosyn.  After discussing history with patient, less suspicious of PNA (afebrile, no cough, no leukocytosis), and more likely due to chronic effusion 2/2 ESRD with superimposed atelectasis.  CXR reviewed with radiologist.  Continue to monitor.  If clinical status deteriorates, will consider restarting Abx.   #Diabetes Mellitus: No HbA1c on file.  Patient takes glyburide at home.  CBG>300 at admission, suggesting that patient is not well controlled.  -hold  glyburide during hospitalization -CBGs qACHS -SSI -order HbA1c  #ESRD: Likely 2/2 HTN & DM. 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  Patient to be restarted on home meds (labetalol, amlodipine) -continue to monitor BP  #Normocytic Anemia: seems to be at baseline, likely related to ESRD, patient may need further outpatient work up with colonoscopy, monitor with AM CBC  #VTE ppx: Heparin TID  Signed: Vernice Jefferson 05/05/2011, 3:54 PM

## 2011-05-05 NOTE — ED Notes (Signed)
Pt complaining of headache, explained to pt and family this is most likely due to her increased blood pressure, pt and family verbalized poc. Pt remains on monitor with family at bedside.

## 2011-05-05 NOTE — ED Provider Notes (Signed)
History     CSN: [id number]  Arrival date & time 05/05/11  1038   First MD Initiated Contact with Patient 05/05/11 1044      Chief Complaint  Patient presents with  . Hyperglycemia    (Consider location/radiation/quality/duration/timing/severity/associated sxs/prior treatment) HPI Patient presents with complaint of generalized weakness and lightheadedness. She and her family report that she has felt these symptoms especially after dialysis sessions over the past several weeks but worse over the last 2-3 days. She also reports her blood sugar has been running approximately 300. She was seen in urgent care 2 days ago and prescribed insulin. She receives dialysis Tuesday Thursday and Saturday and had her full dialysis treatment yesterday but felt much more tired and weak after discussion with complete. This morning she stood up and felt lightheaded with a near syncopal event causing her to fall. She and family deny fevers or chills no difficulty breathing or cough. She denies abdominal pain or vomiting. She does take sorbitol for constipation and notes that this causes her to have loose bowel movements but this is normal for her. She has not seen any blood in her stool and denies melena. There no other associated systemic symptoms. There are no alleviating or modifying factors.  Past Medical History  Diagnosis Date  . Cancer   . Renal disorder     dialysis    Past Surgical History  Procedure Date  . Bladder cancer   . Renal failure     History reviewed. No pertinent family history.  History  Substance Use Topics  . Smoking status: Never Smoker   . Smokeless tobacco: Not on file  . Alcohol Use: No    OB History    Grav Para Term Preterm Abortions TAB SAB Ect Mult Living                  Review of Systems ROS reviewed and otherwise negative except for mentioned in HPI  Allergies  Review of patient's allergies indicates no known allergies.  Home Medications   Current  Outpatient Rx  Name Route Sig Dispense Refill  . AMLODIPINE BESYLATE 10 MG PO TABS Oral Take 10 mg by mouth daily.    . GLYBURIDE 5 MG PO TABS Oral Take 5 mg by mouth daily with breakfast.    . LABETALOL HCL 200 MG PO TABS Oral Take 400 mg by mouth 2 (two) times daily.    . SORBITOL 70 % PO SOLN Oral Take 60 mLs by mouth 2 (two) times daily as needed. For bowel movement      BP 217/53  Pulse 64  Temp(Src) 98.9 F (37.2 C) (Oral)  Resp 18  SpO2 97% Vitals reviewed Physical Exam Physical Examination: General appearance - alert, tired and chronically ill appearing, and in no distress Mental status - alert, oriented to person, place, and time Eyes - pupils equal and reactive, no scleral icterus Mouth - MM somewhat dry, OP without lesions or erythema Chest - clear to auscultation, no wheezes, rales or rhonchi, symmetric air entry Heart - normal rate, regular rhythm, normal S1, S2, no murmurs, rubs, clicks or gallops Abdomen - soft, nontender, nondistended, no masses or organomegaly, nabs Neurological - alert, oriented, normal speech, no focal findings, strength 5/5 in extremities x 4, sensation intact Extremities - peripheral pulses normal, no pedal edema, no clubbing or cyanosis, dialysis graft with palpable thrill in left upper extremity Skin - skin dry, somewhat pale, no rashes  ED Course  Procedures (including critical  care time)  Date: 05/05/2011  Rate: 62  Rhythm: sinus rhythm with first degree AV block  QRS Axis: normal  Intervals: PR prolonged  ST/T Wave abnormalities: LVH with repol abnormality as well as isolated t wave inversion in V6  Conduction Disutrbances:first-degree A-V block   Narrative Interpretation:   Old EKG Reviewed: changes noted compared to prior ekg of 08/01/05    Labs Reviewed  GLUCOSE, CAPILLARY - Abnormal; Notable for the following:    Glucose-Capillary 289 (*)    All other components within normal limits  CBC - Abnormal; Notable for the following:     RBC 3.42 (*)    Hemoglobin 11.7 (*)    HCT 34.1 (*)    MCH 34.2 (*)    All other components within normal limits  BASIC METABOLIC PANEL - Abnormal; Notable for the following:    Chloride 94 (*)    Glucose, Bld 302 (*)    BUN 37 (*)    Creatinine, Ser 4.01 (*)    Calcium 10.9 (*)    GFR calc non Af Amer 10 (*)    GFR calc Af Amer 12 (*)    All other components within normal limits  POCT I-STAT TROPONIN I   Dg Chest 2 View  05/05/2011  *RADIOLOGY REPORT*  Clinical Data: Hypoglycemia  CHEST - 2 VIEW  Comparison: 03/05/2010; 01/10/2006; 08/01/2005  Findings: Grossly unchanged and enlarged cardiac silhouette and mediastinal contours.  There is persistent cephalization of flow with indistinct pulmonary vasculature.  Grossly unchanged small left-sided effusion and left basilar opacities.  No new focal airspace opacity.  No definite pleural effusion.  Grossly unchanged bones.  IMPRESSION: 1.  Grossly unchanged findings of cardiomegaly and pulmonary edema.  2. Grossly unchanged left-sided effusion and left basilar opacities, atelectasis versus infiltrate.  A follow-up chest radiograph in 4 to 6 weeks after treatment is recommended to ensure resolution.  Original Report Authenticated By: Waynard Reeds, M.D.   Ct Head Wo Contrast  05/05/2011  *RADIOLOGY REPORT*  Clinical Data: Hyperglycemia.  Dizziness.  Hypertension.  CT HEAD WITHOUT CONTRAST  Technique:  Contiguous axial images were obtained from the base of the skull through the vertex without contrast.  Comparison: Next CT of 07/11/2005  Findings: Bone windows demonstrate hyperostosis frontalis interna. Clear paranasal sinuses and mastoid air cells.  Soft tissue windows demonstrate cerebral and cerebellar atrophy. Mild ventriculomegaly, felt to be secondary.  Remote left basil ganglia lacunar infarct.  Mild low density in the periventricular white matter likely related to small vessel disease.Scattered tiny calcifications are identified within the   cerebral hemispheres bilaterally.  No significant mass effect or surrounding edema.  Otherwise, no  mass lesion, hemorrhage, hydrocephalus, acute infarct, intra-axial, or extra-axial fluid collection.  IMPRESSION:  1. No acute intracranial abnormality. 2.  Cerebral atrophy and small vessel ischemic change 3.  Scattered cerebral hemisphere tiny calcifications.  Although nonspecific, chronic cysticercosis cannot be excluded.  Original Report Authenticated By: Consuello Bossier, M.D.     1. Weakness   2. Healthcare-associated pneumonia   3. Hypertension   4. End stage renal disease on dialysis       MDM  Patient presents with generalized weakness and fatigue as well as lightheadedness and near syncope today. Her workup in the ED reveals a left-sided infiltrate versus atelectasis. She's also noted to be hypertensive.  Pt started on vanc/zosyn for possible HCAP, given labetalol for hypertension- d/w [facility] resident for admission.  They will see the patient in the ED.  Ethelda Chick, MD 05/05/11 [phone]

## 2011-05-05 NOTE — ED Notes (Signed)
Admitting at bedside 

## 2011-05-05 NOTE — Progress Notes (Signed)
Patient received labetalol at 1815 per Dr. Dorthula Rue due to blood pressure.

## 2011-05-06 ENCOUNTER — Encounter (HOSPITAL_COMMUNITY): Payer: Self-pay | Admitting: Internal Medicine

## 2011-05-06 LAB — COMPREHENSIVE METABOLIC PANEL
ALT: 12 U/L (ref 0–35)
AST: 18 U/L (ref 0–37)
Albumin: 3.3 g/dL — ABNORMAL LOW (ref 3.5–5.2)
BUN: 51 mg/dL — ABNORMAL HIGH (ref 6–23)
CO2: 25 mEq/L (ref 19–32)
Calcium: 11 mg/dL — ABNORMAL HIGH (ref 8.4–10.5)
Chloride: 95 mEq/L — ABNORMAL LOW (ref 96–112)
Creatinine, Ser: 5.54 mg/dL — ABNORMAL HIGH (ref 0.50–1.10)
GFR calc Af Amer: 8 mL/min — ABNORMAL LOW (ref >90)
GFR calc non Af Amer: 7 mL/min — ABNORMAL LOW (ref >90)
Glucose, Bld: 177 mg/dL — ABNORMAL HIGH (ref 70–99)
Sodium: 137 mEq/L (ref 135–145)
Total Bilirubin: 0.5 mg/dL (ref 0.3–1.2)

## 2011-05-06 LAB — CBC
Hemoglobin: 11.3 g/dL — ABNORMAL LOW (ref 12.0–15.0)
MCH: 33.7 pg (ref 26.0–34.0)
MCHC: 33.8 g/dL (ref 30.0–36.0)
MCV: 99.7 fL (ref 78.0–100.0)
Platelets: 153 10*3/uL (ref 150–400)
RBC: 3.35 MIL/uL — ABNORMAL LOW (ref 3.87–5.11)
RDW: 15.2 % (ref 11.5–15.5)
WBC: 4.2 10*3/uL (ref 4.0–10.5)

## 2011-05-06 LAB — MRSA PCR SCREENING: MRSA by PCR: NEGATIVE

## 2011-05-06 LAB — GLUCOSE, CAPILLARY: Glucose-Capillary: 171 mg/dL — ABNORMAL HIGH (ref 70–99)

## 2011-05-06 MED ORDER — MECLIZINE HCL 12.5 MG PO TABS: 12.5000 mg | ORAL_TABLET | Freq: Three times a day (TID) | ORAL | Status: AC | PRN

## 2011-05-06 MED ORDER — SEVELAMER CARBONATE 800 MG PO TABS
1600.0000 mg | ORAL_TABLET | Freq: Three times a day (TID) | ORAL | Status: DC
  Filled 2011-05-06: qty 2

## 2011-05-06 MED ORDER — MECLIZINE HCL 12.5 MG PO TABS: 12.5000 mg | ORAL_TABLET | Freq: Three times a day (TID) | ORAL | Status: DC | PRN

## 2011-05-06 MED ORDER — DARBEPOETIN ALFA-POLYSORBATE 25 MCG/0.42ML IJ SOLN: 25.0000 ug | INTRAMUSCULAR | Status: DC

## 2011-05-06 MED ORDER — LISINOPRIL 10 MG PO TABS
10.0000 mg | ORAL_TABLET | Freq: Every day | ORAL | Status: DC
  Administered 2011-05-06: 10 mg via ORAL
  Filled 2011-05-06: qty 1

## 2011-05-06 MED ORDER — SORBITOL 70 % PO SOLN: 15.0000 mL | Freq: Two times a day (BID) | ORAL | Status: DC | PRN

## 2011-05-06 MED ORDER — RENA-VITE PO TABS
1.0000 | ORAL_TABLET | Freq: Every day | ORAL | Status: DC
  Filled 2011-05-06: qty 1

## 2011-05-06 MED ORDER — HYDRALAZINE HCL 25 MG PO TABS
25.0000 mg | ORAL_TABLET | Freq: Four times a day (QID) | ORAL | Status: DC | PRN
  Administered 2011-05-06: 25 mg via ORAL
  Filled 2011-05-06: qty 1

## 2011-05-06 MED ORDER — LISINOPRIL 10 MG PO TABS: 10.0000 mg | ORAL_TABLET | Freq: Every day | ORAL | Status: DC

## 2011-05-06 NOTE — Consult Note (Signed)
[facility] Renal Consultation Note  Indication for Consultation:  Management of ESRD/hemodialysis; anemia, hypertension/volume and secondary hyperparathyroidism  HPI: [name] is a 71 y.o.  Non- English speaking Hispanic female admitted with near Syncopal episode. Family in her room now and her symptoms have resolved. This occurred about a week ago also with her dizziness worse after HD. She has a history of severe HTN with sbp noted in 200 or 190 s pre hd  With questionable history of bp med compliance. Also note she has chronic constipation and uses sorbitol.and recently,"apparently had a large BM with large volume loss" and this could also trigger her  dizzy symptoms d   Dialysis Orders: Center: [facility]  on tthsat . EDW 57kg HD Bath 2.0 k, 2.25 ca  Time 3.0Heparin tight. Access l upper ar avf BFR 400  DFR 800    Zemplar 0 mcg IV/HD Epogen 3000   Units IV/HD  Venofer  0  Other 0    Past Medical History  Diagnosis Date  . Cancer   . ESRD on dialysis     dialysis  . Hypertension   . Diabetes mellitus   . Bladder cancer Jan 10, 2006    BLADDER NECK, BIOPSY: DIFFUSE LARGE B-CELL LYMPHOMA. CT showed worrisome pelvic LN. No treatment records in EPIC.    Past Surgical History  Procedure Date  . Bladder cancer   . Renal failure       Family History  Problem Relation Age of Onset  . Thyroid disease Mother       reports that she has never smoked. She does not have any smokeless tobacco history on file. She reports that she does not drink alcohol. Her drug history not on file.  No Known Allergies  Prior to Admission medications   Medication Sig Start Date End Date Taking? Authorizing Provider  amLODipine (NORVASC) 10 MG tablet Take 10 mg by mouth daily.   Yes Historical Provider, MD  glyBURIDE (DIABETA) 5 MG tablet Take 5 mg by mouth daily with breakfast.   Yes Historical Provider, MD  labetalol (NORMODYNE) 200 MG tablet Take 400 mg by mouth 2 (two) times  daily.   Yes Historical Provider, MD  lisinopril (PRINIVIL,ZESTRIL) 10 MG tablet Take 1 tablet (10 mg total) by mouth daily. 05/06/11 05/05/12  Alanson Puls, MD  meclizine (ANTIVERT) 12.5 MG tablet Take 1 tablet (12.5 mg total) by mouth 3 (three) times daily as needed for dizziness. 05/06/11 05/16/11  Alanson Puls, MD  sorbitol 70 % solution Take 15 mLs by mouth 2 (two) times daily as needed. For bowel movement 05/06/11   Alanson Puls, MD     Anti-infectives     Start     Dose/Rate Route Frequency Ordered Stop   05/05/11 1315   vancomycin (VANCOCIN) IVPB 1000 mg/200 mL premix        1,000 mg 200 mL/hr over 60 Minutes Intravenous  Once 05/05/11 1301 05/05/11 1612   05/05/11 1315   piperacillin-tazobactam (ZOSYN) IVPB 3.375 g        3.375 g 12.5 mL/hr over 240 Minutes Intravenous  Once 05/05/11 1301 05/05/11 1415          Results for orders placed during the hospital encounter of 05/05/11 (from the past 48 hour(s))  GLUCOSE, CAPILLARY     Status: Abnormal   Collection Time   05/05/11 11:16 AM      Component Value Range Comment   Glucose-Capillary 289 (*) 70 - 99 (  mg/dL)   CBC     Status: Abnormal   Collection Time   05/05/11 11:56 AM      Component Value Range Comment   WBC 4.1  4.0 - 10.5 (K/uL)    RBC 3.42 (*) 3.87 - 5.11 (MIL/uL)    Hemoglobin 11.7 (*) 12.0 - 15.0 (g/dL)    HCT 84.1 (*) 32.4 - 46.0 (%)    MCV 99.7  78.0 - 100.0 (fL)    MCH 34.2 (*) 26.0 - 34.0 (pg)    MCHC 34.3  30.0 - 36.0 (g/dL)    RDW 40.1  02.7 - 25.3 (%)    Platelets 158  150 - 400 (K/uL)   BASIC METABOLIC PANEL     Status: Abnormal   Collection Time   05/05/11 11:56 AM      Component Value Range Comment   Sodium 137  135 - 145 (mEq/L)    Potassium 4.2  3.5 - 5.1 (mEq/L)    Chloride 94 (*) 96 - 112 (mEq/L)    CO2 26  19 - 32 (mEq/L)    Glucose, Bld 302 (*) 70 - 99 (mg/dL)    BUN 37 (*) 6 - 23 (mg/dL)    Creatinine, Ser 6.64 (*) 0.50 - 1.10 (mg/dL)    Calcium 40.3 (*) 8.4 - 10.5 (mg/dL)    GFR  calc non Af Amer 10 (*) >90 (mL/min)    GFR calc Af Amer 12 (*) >90 (mL/min)   POCT I-STAT TROPONIN I     Status: Normal   Collection Time   05/05/11  1:41 PM      Component Value Range Comment   Troponin i, poc 0.04  0.00 - 0.08 (ng/mL)    Comment 3            GLUCOSE, CAPILLARY     Status: Abnormal   Collection Time   05/05/11  4:58 PM      Component Value Range Comment   Glucose-Capillary 297 (*) 70 - 99 (mg/dL)   HEMOGLOBIN K7Q     Status: Abnormal   Collection Time   05/05/11  5:07 PM      Component Value Range Comment   Hemoglobin A1C 8.5 (*) <5.7 (%)    Mean Plasma Glucose 197 (*) <117 (mg/dL)   CARDIAC PANEL(CRET KIN+CKTOT+MB+TROPI)     Status: Normal   Collection Time   05/05/11  5:39 PM      Component Value Range Comment   Total CK 27  7 - 177 (U/L)    CK, MB 2.2  0.3 - 4.0 (ng/mL)    Troponin I <0.30  <0.30 (ng/mL)    Relative Index RELATIVE INDEX IS INVALID  0.0 - 2.5    GLUCOSE, CAPILLARY     Status: Abnormal   Collection Time   05/05/11  9:40 PM      Component Value Range Comment   Glucose-Capillary 184 (*) 70 - 99 (mg/dL)    Comment 1 Notify RN     MRSA PCR SCREENING     Status: Normal   Collection Time   05/06/11  6:33 AM      Component Value Range Comment   MRSA by PCR NEGATIVE  NEGATIVE    CBC     Status: Abnormal   Collection Time   05/06/11  7:00 AM      Component Value Range Comment   WBC 4.2  4.0 - 10.5 (K/uL)    RBC 3.35 (*) 3.87 - 5.11 (MIL/uL)  Hemoglobin 11.3 (*) 12.0 - 15.0 (g/dL)    HCT 16.1 (*) 09.6 - 46.0 (%)    MCV 99.7  78.0 - 100.0 (fL)    MCH 33.7  26.0 - 34.0 (pg)    MCHC 33.8  30.0 - 36.0 (g/dL)    RDW 04.5  40.9 - 81.1 (%)    Platelets 153  150 - 400 (K/uL)   COMPREHENSIVE METABOLIC PANEL     Status: Abnormal   Collection Time   05/06/11  7:00 AM      Component Value Range Comment   Sodium 137  135 - 145 (mEq/L)    Potassium 4.3  3.5 - 5.1 (mEq/L)    Chloride 95 (*) 96 - 112 (mEq/L)    CO2 25  19 - 32 (mEq/L)    Glucose, Bld  177 (*) 70 - 99 (mg/dL)    BUN 51 (*) 6 - 23 (mg/dL)    Creatinine, Ser 9.14 (*) 0.50 - 1.10 (mg/dL)    Calcium 78.2 (*) 8.4 - 10.5 (mg/dL)    Total Protein 6.5  6.0 - 8.3 (g/dL)    Albumin 3.3 (*) 3.5 - 5.2 (g/dL)    AST 18  0 - 37 (U/L)    ALT 12  0 - 35 (U/L)    Alkaline Phosphatase 130 (*) 39 - 117 (U/L)    Total Bilirubin 0.5  0.3 - 1.2 (mg/dL)    GFR calc non Af Amer 7 (*) >90 (mL/min)    GFR calc Af Amer 8 (*) >90 (mL/min)   GLUCOSE, CAPILLARY     Status: Abnormal   Collection Time   05/06/11  8:06 AM      Component Value Range Comment   Glucose-Capillary 170 (*) 70 - 99 (mg/dL)   GLUCOSE, CAPILLARY     Status: Abnormal   Collection Time   05/06/11 11:52 AM      Component Value Range Comment   Glucose-Capillary 203 (*) 70 - 99 (mg/dL)    EKG: normal EKG, normal sinus rhythm, unchanged from previous tracings, atrial fibrillation, .  ROS: hard to obtain sec. To language but no fevers, sob, chest pain, only dizziness as in hpi. 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   Physical Exam: Filed Vitals:   05/06/11 1539  BP: 176/46  Pulse: 55  Temp: 98.7 F (37.1 C)  Resp: 18     General:Alert, NAD, pleasant thin elderly female HEENT:[place], MMM Eyes:nonicteric Neck: supple, no jvd and no bruits heard Heart: RRR, 3/6 hsm lsb, vs bruit from avf Lungs:CTA bilat.  Abdomen:soft nt. Extremities: trace pedal edema Skin:no overt ulcer or rash seen Neuro:alert, moving all ext Dialysis Access:pos. Bruit left upper arm avf  Assessment/Plan: 1.Vertigo= work up per admit team 2. ESRD -  [facility]  [facility] . HD in am to edw 3 Hypertension/volume  - hd and bp meds, NEED TO MONITOR BP CLOSELY AS I DO NOT THINK SHE IS COMPLIANT WITH HOME MEDS, may have low bps in hosp. Noted Left pl . Effusion on cxr fu op   4. Anemia  - 11.3 hgb aranesp on hd 5. Metabolic bone disease -  Binders , no zemplar 6. Nutrition -  Supplement as  needed  Lenny Pastel, PA-C [facility] Beeper [phone] 05/06/2011, 4:39 PM   Patient seen and examined and agree with assessment and plan as above. 71 yo Hispanic female with vertigo and presyncopal symptoms. Pt is  on HD TTS, has hx poorly controlled HTN also. No volume excess or electrolyte disturbance to suggest etiology of presenting symptoms. Will plan HD for tomorrow, continue usual BP meds, binders, etc. Will follow.  Vinson Moselle  MD [employer] [phone] pgr    [phone] cell 05/06/2011, 6:22 PM

## 2011-05-06 NOTE — Progress Notes (Signed)
Pt. Got d/c order,IV has been d/c,tele d/c.Discharger instruction and prescriptions were given pt pt's daughter.

## 2011-05-06 NOTE — Progress Notes (Addendum)
Noted CM referral for [facility], spoke with Dr Milbert Coulter, who states that plan is to follow pt in clinic, therefore pt will have a PCP once she is established. Call placed to [name], [facility] rep re this referral.  Johny Shock RN MPH     CARE MANAGEMENT NOTE 05/06/2011  Patient:  [name], [name]   Account Number:  [id number]  Date Initiated:  05/06/2011  Documentation initiated by:  Johny Shock  Subjective/Objective Assessment:   Noted order for [facility] referral and home health.     Action/Plan:   [facility] referral made, spoke with MD re this pt PCP, and learned that pt will be followed at clinic. [facility] notifed and will see pt , if appropriate will follow after pt  established with PCP.   Anticipated DC Date:  05/06/2011   Anticipated DC Plan:  HOME/SELF CARE         PAC Choice  HOME HEALTH   Choice offered to / List presented to:             Status of service:  Completed, signed off Medicare Important Message given?   (If response is "NO", the following Medicare IM given date fields will be blank) Date Medicare IM given:   Date Additional Medicare IM given:    Discharge Disposition:  HOME/SELF CARE  Per UR Regulation:  Reviewed for med. necessity/level of care/duration of stay  If discussed at Long Length of Stay Meetings, dates discussed:    Comments:  05/06/2011 Referral made to [facility], based on pt becoming pt of clinic with PCP in clinic. No other HH needs identified. Johny Shock RN MPH Case Manager [phone]

## 2011-05-06 NOTE — Progress Notes (Signed)
Patient's BP 210/47 HR 57 Temp 97.3 R 16 O2 sat 93% on RA.Patient denies any c/o.Dr. Berlinda Last notified,order received for hydralazine.Will continue to monitor. Quention Mcneill Joselita,RN

## 2011-05-06 NOTE — H&P (Signed)
Please see Dr Tammy Sours H&P for full details.  As pt is non-English speaking, Drs Rondel Baton, MSIII Girguis and I saw [name] together. Her son translated quite adequately. [name] is a 71 yo female with ESRD, DM, HTN, who had acute onset 1 week ago of dizziness that was worse with movement. She initially noticed that it was worse after HD. On day of admission, she had a near syncopal episode and came to ER. Pt has been treated with meclizine and her dizziness has fully resolved. She has been up and OOB and no dizziness with ambulation.  Of note, she has chronic constipation and uses sorbitol. Apparently had a large BM with large volume loss which definitely could trigger presyncopy.  PMHx : ESRD DM II non insulin dependent HTN Chronic constipation h/o bladder cancer 2007. B lymphoma. CT indicated pelvic LN. Op report seemed to indicate no clean margins.  Labs and studies were reviewed.   1. Vertigo - resolved. Either from volume shifts with sorbitol or BPPV. When she F/U in outpt clinic, might encourage dietary changes (increased fiber, bulking agents) and stool softer to try to decrease need for sorbitol. She will go home with meclizine. CT head was negative although sxs most likely posterior circulation. However, sxs spontaneously resolved and more likely other dx so no need for further imaging.  2. Isolated systolic HTN - Dr Milbert Coulter spoke with renal and an ACEI will be started.  3. Chronic constipation - dietary changes, increased fiber, colace.  4. DM - outpt mgmt.  5. L pleural effusion - present one year which argues against serious etiology. Original CXR obtained for clinically apparent PNA. Pt has no sxs of PNA currently. However, pt has this h/o cancer. Will obtain records and F/U outpt.   6. Dispo - pt is medically stable for D/C today. Her son was asking about assistance at home as his father is also aged. 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 Will get PT / OT eval to see if needs acute  skilled need at home which would get her a short term aide. Also referral to [facility]. Pt will be following up in [facility].

## 2011-05-06 NOTE — Progress Notes (Signed)
Subjective: Feels much better. Reports dizziness has improved after medication given yesterday.  Clarified that she does not produce urine anymore, but copious diarrhea yesterday 2/2 sorbitol.  No diarrhea since hospitalization.  No SOB/CP.  Feels back to baseline.  Son at bedside.  Objective: Vital signs in last 24 hours: Filed Vitals:   05/06/11 0224 05/06/11 0531 05/06/11 0623 05/06/11 0850  BP: 210/47 213/48 203/44 209/38  Pulse: 57 56 55 57  Temp: 97.3 F (36.3 C) 98.8 F (37.1 C)  98.7 F (37.1 C)  TempSrc: Oral Oral  Oral  Resp: 16 16  17   Height:      Weight:      SpO2: 93% 95%  95%   Weight change:   Intake/Output Summary (Last 24 hours) at 05/06/11 1110 Last data filed at 05/06/11 0700  Gross per 24 hour  Intake    240 ml  Output      0 ml  Net    240 ml   Physical Exam: General: resting in bed, no acute distress, cooperative to exam HEENT: PERRL, EOMI, no scleral icterus, no conjunctival pallor Cardiac: RRR, +systolic murmur (may be radiating from dialysis access) Pulm: clear to auscultation bilaterally, moving normal volumes of air Abd: soft, nontender, nondistended, BS normoactive Ext: warm and well perfused, no pedal edema, left UE dialysis access Neuro: alert and oriented X3  Lab Results: Basic Metabolic Panel:  Lab 05/06/11 8295 05/05/11 1156  NA 137 137  K 4.3 4.2  CL 95* 94*  CO2 25 26  GLUCOSE 177* 302*  BUN 51* 37*  CREATININE 5.54* 4.01*  CALCIUM 11.0* 10.9*  MG -- --  PHOS -- --   Liver Function Tests:  Lab 05/06/11 0700  AST 18  ALT 12  ALKPHOS 130*  BILITOT 0.5  PROT 6.5  ALBUMIN 3.3*   CBC:  Lab 05/06/11 0700 05/05/11 1156 05/01/11 1919  WBC 4.2 4.1 --  NEUTROABS -- -- 2.2  HGB 11.3* 11.7* --  HCT 33.4* 34.1* --  MCV 99.7 99.7 --  PLT 153 158 --   Cardiac Enzymes:  Lab 05/05/11 1739  CKTOTAL 27  CKMB 2.2  CKMBINDEX --  TROPONINI <0.30   CBG:  Lab 05/06/11 0806 05/05/11 2140 05/05/11 1658 05/05/11 1116  GLUCAP  170* 184* 297* 289*   Hemoglobin A1C:  Lab 05/05/11 1707  HGBA1C 8.5*   Studies/Results: Dg Chest 2 View  05/05/2011  *RADIOLOGY REPORT*  Clinical Data: Hypoglycemia  CHEST - 2 VIEW  Comparison: 03/05/2010; 01/10/2006; 08/01/2005  Findings: Grossly unchanged and enlarged cardiac silhouette and mediastinal contours.  There is persistent cephalization of flow with indistinct pulmonary vasculature.  Grossly unchanged small left-sided effusion and left basilar opacities.  No new focal airspace opacity.  No definite pleural effusion.  Grossly unchanged bones.  IMPRESSION: 1.  Grossly unchanged findings of cardiomegaly and pulmonary edema.  2. Grossly unchanged left-sided effusion and left basilar opacities, atelectasis versus infiltrate.  A follow-up chest radiograph in 4 to 6 weeks after treatment is recommended to ensure resolution.  Original Report Authenticated By: Waynard Reeds, M.D.   Ct Head Wo Contrast  05/05/2011  *RADIOLOGY REPORT*  Clinical Data: Hyperglycemia.  Dizziness.  Hypertension.  CT HEAD WITHOUT CONTRAST  Technique:  Contiguous axial images were obtained from the base of the skull through the vertex without contrast.  Comparison: Next CT of 07/11/2005  Findings: Bone windows demonstrate hyperostosis frontalis interna. Clear paranasal sinuses and mastoid air cells.  Soft tissue windows demonstrate  cerebral and cerebellar atrophy. Mild ventriculomegaly, felt to be secondary.  Remote left basil ganglia lacunar infarct.  Mild low density in the periventricular white matter likely related to small vessel disease.Scattered tiny calcifications are identified within the  cerebral hemispheres bilaterally.  No significant mass effect or surrounding edema.  Otherwise, no  mass lesion, hemorrhage, hydrocephalus, acute infarct, intra-axial, or extra-axial fluid collection.  IMPRESSION:  1. No acute intracranial abnormality. 2.  Cerebral atrophy and small vessel ischemic change 3.  Scattered cerebral  hemisphere tiny calcifications.  Although nonspecific, chronic cysticercosis cannot be excluded.  Original Report Authenticated By: Consuello Bossier, M.D.   Medications: I have reviewed the patient's current medications. Scheduled Meds:   . amLODipine  10 mg Oral Daily  . aspirin EC  81 mg Oral Daily  . heparin  5,000 Units Subcutaneous Q8H  . insulin aspart  0-9 Units Subcutaneous TID WC  . labetalol  400 mg Oral BID  . labetalol  20 mg Intravenous Once  . piperacillin-tazobactam (ZOSYN)  IV  3.375 g Intravenous Once  . sodium chloride  3 mL Intravenous Q12H  . vancomycin  1,000 mg Intravenous Once   Continuous Infusions:  PRN Meds:.sodium chloride, acetaminophen, acetaminophen, alum & mag hydroxide-simeth, hydrALAZINE, meclizine, morphine, ondansetron (ZOFRAN) IV, ondansetron, sodium chloride, sorbitol  Assessment/Plan:  #Dizziness: Likely 2/2 BPPV given resolution of symptoms today;  Given wide pulse pressure, cardiomegaly and murmur heard on exam critical aortic stenosis is on the differential, echo pending for today. CE (-) x 1. Nothing significant on tele.  -PT/OT for vesitibular rehab pending, meclizine prn -echo pending  #Atelectasis versus infiltrate on CXR: No active issues, asymptomatic and no significant findings on PE.  Initially thought to be 2/2 PNA so was treated with 1 dose of vanc/zosyn. After discussing history with patient, less suspicious of PNA (afebrile, no cough, no leukocytosis), and more likely due to chronic effusion 2/2 ESRD with superimposed atelectasis. CXR reviewed with radiologist.   #Diabetes Mellitus: HbA1c=8.5. Patient takes glyburide at home. Acceptable control for age and other comorbidities. Will continue glyburide at home.  Continue SSI during hospitalization with CBGs qACHS -hold glyburide during hospitalization    #ESRD: Likely 2/2 HTN & DM. Patient is on TTS HD schedule, she denies skipping any sessions, last session was day prior to arrival, will  likely not need HD during hospitalization if d/c home today  #HTN: BP significantly elevated with wide pulse pressure at admission, suggesting aortic stenosis. Patient to continue home meds (labetalol, amlodipine).  Add lisinopril. -continue to monitor BP   #Normocytic Anemia: seems to be at baseline, likely related to ESRD, patient may need further outpatient work up with colonoscopy  #VTE ppx: Heparin TID  #Dispo: likely d/c home today after PT/OT & Echo   LOS: 1 day   KAPADIA, Jenefer Woerner 05/06/2011, 11:10 AM

## 2011-05-06 NOTE — Evaluation (Addendum)
Occupational Therapy Evaluation Patient Details Name: [name] MRN: [id number] DOB: March 19, 1940 Today's Date: 05/06/2011 14:38-15:32  evII,  Problem List:  Patient Active Problem List  Diagnoses  . ESRD (end stage renal disease) on dialysis  . Dizziness  . Diabetes mellitus  . Hypertension    Past Medical History:  Past Medical History  Diagnosis Date  . Cancer   . ESRD on dialysis     dialysis  . Hypertension   . Diabetes mellitus   . Bladder cancer Jan 10, 2006    BLADDER NECK, BIOPSY: DIFFUSE LARGE B-CELL LYMPHOMA. CT showed worrisome pelvic LN. No treatment records in EPIC.   Past Surgical History:  Past Surgical History  Procedure Date  . Bladder cancer   . Renal failure     OT Assessment/Plan/Recommendation OT Assessment Clinical Impression Statement: 71 yr old female admitted with increased dizziness and decreased balance.  Currently needs min guard assist to close supervision for simulated selfcare tasks. Has 24 hour supervision from her spouse and already has a shower seat in their handicapped bathroom.  Pt with occassional LOB without use of assistive device, but with use of RW is at a supervision level.  Feel she is at her functional baseline.  No detection of nystagmus or dizziness with positional changes.  No further OT needs at this time.   OT Recommendation/Assessment: Patient does not need any further OT services OT Recommendation Follow Up Recommendations: No OT follow up Equipment Recommended: Rolling walker with 5" wheels (pt needs a juvenile RW for approx. 5 ' height)  OT Evaluation Precautions/Restrictions  Precautions Precautions: Fall Required Braces or Orthoses: No Restrictions Weight Bearing Restrictions: No Prior Functioning Home Living Lives With: Spouse Receives Help From: Family Type of Home: Apartment Home Layout: One level Home Access: Level entry Bathroom Shower/Tub: Engineer, manufacturing systems: Standard Home  Adaptive Equipment: Shower chair with back;Grab bars in shower;Grab bars around toilet Prior Function Level of Independence: Needs assistance with homemaking;Needs assistance with ADLs Driving: No Vocation: Retired ADL ADL Eating/Feeding: Simulated;Set up Where Assessed - Eating/Feeding: Edge of bed Grooming: Simulated;Set up Where Assessed - Grooming: Sitting, chair Upper Body Bathing: Simulated;Set up Where Assessed - Upper Body Bathing: Sitting, chair Lower Body Bathing: Simulated;Minimal assistance Lower Body Bathing Details (indicate cue type and reason): Min guard assist Where Assessed - Lower Body Bathing: Sit to stand from bed Upper Body Dressing: Simulated;Set up Where Assessed - Upper Body Dressing: Sitting, bed;Unsupported Lower Body Dressing: Performed;Set up;Other (comment) Lower Body Dressing Details (indicate cue type and reason): Gripper socks only. Where Assessed - Lower Body Dressing: Sitting, bed Toilet Transfer: Performed;Minimal assistance Toilet Transfer Details (indicate cue type and reason): Without use of assistive device. Toilet Transfer Method: Proofreader: Comfort height toilet Toileting - Clothing Manipulation: Simulated;Minimal assistance Toileting - Clothing Manipulation Details (indicate cue type and reason): Min guard assist. Where Assessed - Toileting Clothing Manipulation: Sit to stand from 3-in-1 or toilet Toileting - Hygiene: Simulated;Minimal assistance Toileting - Hygiene Details (indicate cue type and reason): Min guard assist. Where Assessed - Toileting Hygiene: Sit to stand from 3-in-1 or toilet Tub/Shower Transfer: Not assessed Tub/Shower Transfer Method: Not assessed Equipment Used: Rolling walker Ambulation Related to ADLs: Pt overall min assist for mobility without use of assistive device.  Noted one LOB to the right when attempting to ambulate to the bathroom.  With use of RW pt was close supervision, but needed  mod instructional cues for education on positioning of walker and hand placement  with sit to stand.l ADL Comments: Pt overall min guard to min assist for simulated selfcare tasks.  [place] has grab bars at the toilet and shower.  Also has a small seat for the shower as well.  Per son, pt's spouse provides 24 hour supervision and assists with selfcare tasks as needed.  Vision/Perception  Vision - History Baseline Vision: Legally blind Patient Visual Report: No change from baseline Vision - Assessment Vision Assessment: Vision not tested Perception Perception: Within Functional Limits Praxis Praxis: Intact Cognition Cognition Arousal/Alertness: Awake/alert Overall Cognitive Status: Appears within functional limits for tasks assessed Orientation Level: Oriented to place;Oriented to situation Sensation/Coordination Sensation Light Touch: Appears Intact Stereognosis: Not tested Hot/Cold: Not tested Proprioception: Not tested Coordination Gross Motor Movements are Fluid and Coordinated: Yes Fine Motor Movements are Fluid and Coordinated: Yes Extremity Assessment RUE Assessment RUE Assessment: Within Functional Limits LUE Assessment LUE Assessment: Within Functional Limits Mobility  Bed Mobility Bed Mobility: Yes Supine to Sit: 5: Supervision;HOB flat Supine to Sit Details (indicate cue type and reason): safety only Sitting - Scoot to Edge of Bed: 7: Independent Transfers Sit to Stand: 5: Supervision;From toilet;Without upper extremity assist Sit to Stand Details (indicate cue type and reason): vc's for safe hand placement, safe transfer technique with RW Stand to Sit: 5: Supervision  End of Session OT - End of Session Activity Tolerance: Patient tolerated treatment well Patient left: in bed;with call bell in reach;with family/visitor present General Behavior During Session: [facility] for tasks performed Cognition: [facility] for tasks performed   Melissaann Dizdarevic OTR/L 05/06/2011, 4:18 PM   Pager number [phone]

## 2011-05-06 NOTE — Progress Notes (Signed)
Patient's BP 203/44 HR 55.Patient asymptomatic,denies any c/o.Dr. Berlinda Last notified.No new orders.Will continue to monitor. Zalia Hautala Joselita,RN

## 2011-05-06 NOTE — Progress Notes (Signed)
  Echocardiogram 2D Echocardiogram has been performed.  [name] A 05/06/2011, 12:06 PM

## 2011-05-06 NOTE — Discharge Summary (Signed)
Internal [facility] Discharge Note  Name: [name] MRN: [id number] DOB: Jun 15, 1940 71 y.o.  Date of Admission: 05/05/2011 10:39 AM Date of Discharge: 05/06/2011 Attending Physician: Burns Spain, MD  Discharge Diagnosis: Active Problems:  ESRD (end stage renal disease) on dialysis  Dizziness  Diabetes mellitus  Hypertension  Normocytic Anemia  Chronic atelectasis vs infiltrate on CXR   Discharge Medications: Medication List  As of 05/06/2011  5:03 PM   TAKE these medications         amLODipine 10 MG tablet   Commonly known as: NORVASC   Take 10 mg by mouth daily.      glyBURIDE 5 MG tablet   Commonly known as: DIABETA   Take 5 mg by mouth daily with breakfast.      labetalol 200 MG tablet   Commonly known as: NORMODYNE   Take 400 mg by mouth 2 (two) times daily.      lisinopril 10 MG tablet   Commonly known as: PRINIVIL,ZESTRIL   Take 1 tablet (10 mg total) by mouth daily.      meclizine 12.5 MG tablet   Commonly known as: ANTIVERT   Take 1 tablet (12.5 mg total) by mouth 3 (three) times daily as needed for dizziness.      sorbitol 70 % solution   Take 15 mLs by mouth 2 (two) times daily as needed. For bowel movement            Disposition and follow-up:   Ms.[name] was discharged from [facility] in Good condition.  At hospital follow up, please  1. Assess HTN and check BMET (Lisinopril added during hospitalization) 2. Confirm that she has gotten set up with [facility]  Follow-up Appointments: Follow-up Information    Follow up with [name], MD on 05/15/2011. (2:15pm)    Contact information:   [address] [place] [address] [phone]         Discharge Orders    Future Appointments: Provider: Department: Dept Phone: Center:   05/15/2011 2:15 PM Denna Haggard, MD [facility] [phone] [facility]     Future Orders Please Complete By Expires   Diet - low sodium  heart healthy      Increase activity slowly      Discharge instructions      Comments:   Please go to follow up appointment. Please start taking lisinopril every day.   If you have any new or worsening symptoms, please call the Internal Medicine Outpatient clinic at [phone] or go to your nearest emergency department.   Call MD for:  temperature >100.4      Call MD for:  persistant nausea and vomiting      Call MD for:  severe uncontrolled pain      Call MD for:  difficulty breathing, headache or visual disturbances      Call MD for:  persistant dizziness or light-headedness      Call MD for:  extreme fatigue         Procedures Performed:  Dg Chest 2 View 05/05/2011   IMPRESSION: 1.  Grossly unchanged findings of cardiomegaly and pulmonary edema.  2. Grossly unchanged left-sided effusion and left basilar opacities, atelectasis versus infiltrate.  A follow-up chest radiograph in 4 to 6 weeks after treatment is recommended to ensure resolution.    Ct Head Wo Contrast 05/05/2011    IMPRESSION:  1. No acute intracranial abnormality. 2.  Cerebral atrophy and small  vessel ischemic change 3.  Scattered cerebral hemisphere tiny calcifications.  Although nonspecific, chronic cysticercosis cannot be excluded.    2D Echo:  - Left ventricle: The cavity size was normal. There was mild concentric hypertrophy. Systolic function was normal. The estimated ejection fraction was in the range of 55% to 60%. Wall motion was normal; there were no regional wall motion abnormalities. Features are consistent with a pseudonormal left ventricular filling pattern, with concomitant abnormal relaxation and increased filling pressure (grade 2 diastolic dysfunction). Doppler parameters are consistent with high ventricular filling pressure. - Mitral valve: Calcified annulus. Mild regurgitation. - Left atrium: The atrium was mildly dilated. - Right ventricle: The cavity size was mildly dilated. Wall thickness was  mildly increased. - Right atrium: The atrium was mildly dilated. - Tricuspid valve: Moderate regurgitation. - Pulmonary arteries: Systolic pressure was moderately to severely increased. - Pericardium, extracardiac: A trivial pericardial effusion was identified.  Admission HPI:  Patient is a 71 yo spanish speaking woman with PMH significant for HTN, DM and ESRD that presents with acute onset dizziness x 1 week that is worse with positional changes and better when staying still. She was seen in urgent care for this problem on 05/01/11. At that time she noted that dizziness is worse after HD. At urgent care, it was thought that dizziness was related to uncontrolled DM worsened by vol depletion during HD. She was advised to transfer to ED, but family declined transfer. She returned today (with grandson at bedside) after having an episode of near syncope this morning. She denies LOC, but notes that she was alone when she felt faint. She reports feeling diaphoretic without palpitations, and thought symptoms were due to low blood sugars, so she tried to eat candy. She reports significant polyuria this morning as well. She denies N/V/fever/chest pain/tinnitus. She notes that she felt the room was spinning prior to feeling faint this morning. She has never felt similar symptoms in the past.  Of note, she attends HD on a TTS schedule (last HD was yesterday) in [place], but she does not know the location.   Admission Physical Exam:  Blood pressure 206/51, pulse 64, temperature 98.7 F (37.1 C), temperature source Oral, resp. rate 18, SpO2 93.00%.  Vital signs reviewed. Wide pulse pressure noted.  Supine: 216/50 HR 61 Standing: 229/52 HR 63  General: resting in bed, no acute distress, cooperative with exam  HEENT: no scleral icterus, no conjunctival pallor, no nystagmus with dix hall pike, but difficult to perform since patient felt dizzy with mvmt, normal TM  Cardiac: RRR, systolic murmur best heard at  right 2nd intercostal space  Pulm: clear to auscultation bilaterally, moving normal volumes of air  Abd: soft, nontender, nondistended, BS normoactive  Ext: warm and well perfused, no pedal edema, venous stasis skin changes in b/l LE, dialysis access with palpable thrill in LUE  Neuro: alert and oriented X3, no focal deficits, visual deficiency in b/l eyes   Admission Lab results:  Basic Metabolic Panel:   Basename  05/05/11 1156   NA  137   K  4.2   CL  94*   CO2  26   GLUCOSE  302*   BUN  37*   CREATININE  4.01*   CALCIUM  10.9*   MG  --   PHOS  --    CBC:   Basename  05/05/11 1156   WBC  4.1   NEUTROABS  --   HGB  11.7*   HCT  34.1*  MCV  99.7   PLT  158    CBG:   Basename  05/05/11 1116   GLUCAP  [facility] Course by problem list: #Dizziness: Likely 2/2 volume shifts after excess sorbitol causing diarrhea vs BPPV.  Resolved by day of discharge.  No diarrhea during hospitalization, but it was clarified that patient meant she was having copious diarrhea prior to admission, not polyuria. Given wide pulse pressure, cardiomegaly and murmur heard on exam critical aortic stenosis was on the differential, but echo not suggestive of AS.  Murmur likely 2/2 radiation from dialysis access. Negative head CT.  CE (-) x 1. Nothing significant on tele. During hospitalization, patient was seen by PT/OT for vestibular rehab.  Discharged with meclizine prn and instructed to decrease amount of sorbitol intake.  She should be counseled to make dietary changes & use stool softener.  #Atelectasis versus infiltrate on CXR: No active issues, asymptomatic and no significant findings on PE. Initially thought to be 2/2 PNA so was treated with 1 dose of vanc/zosyn. After discussing history with patient, less suspicious of PNA (afebrile, no cough, no leukocytosis), and more likely due to chronic effusion 2/2 ESRD with superimposed atelectasis. CXR reviewed with radiologist. Of concern is  malignant effusion 2/2 history of bladder cancer s/p resection without clear borders.  Records from the cancer center have been requested.   #Diabetes Mellitus: HbA1c=8.5. Patient takes glyburide at home. Acceptable control for age and other comorbidities. Will continue glyburide at home. Treated with SSI during hospitalization with CBGs qACHS and glyburide was held.  #ESRD: Likely 2/2 HTN & DM. Patient is on TTS HD schedule, she denies skipping any sessions, last session was day prior to arrival.  Did not need HD during hospitalization.   #HTN: BP significantly elevated with wide pulse pressure at admission. Patient to continue home meds (labetalol, amlodipine). Lisinopril added during hospitalization and at discharge.   #Normocytic Anemia: seems to be at baseline, likely related to ESRD, patient may need further outpatient work up with colonoscopy.   Discharge Vitals:  BP 186/53  Pulse 55  Temp(Src) 98.4 F (36.9 C) (Oral)  Resp 17  Ht 5' (1.524 m)  Wt 125 lb 10.6 oz (57 kg)  BMI 24.54 kg/m2  SpO2 95%  Discharge Labs:  Results for orders placed during the hospital encounter of 05/05/11 (from the past 24 hour(s))  HEMOGLOBIN A1C     Status: Abnormal   Collection Time   05/05/11  5:07 PM      Component Value Range   Hemoglobin A1C 8.5 (*) <5.7 (%)   Mean Plasma Glucose 197 (*) <117 (mg/dL)  CARDIAC PANEL(CRET KIN+CKTOT+MB+TROPI)     Status: Normal   Collection Time   05/05/11  5:39 PM      Component Value Range   Total CK 27  7 - 177 (U/L)   CK, MB 2.2  0.3 - 4.0 (ng/mL)   Troponin I <0.30  <0.30 (ng/mL)   Relative Index RELATIVE INDEX IS INVALID  0.0 - 2.5   GLUCOSE, CAPILLARY     Status: Abnormal   Collection Time   05/05/11  9:40 PM      Component Value Range   Glucose-Capillary 184 (*) 70 - 99 (mg/dL)   Comment 1 Notify RN    MRSA PCR SCREENING     Status: Normal   Collection Time   05/06/11  6:33 AM      Component Value Range   MRSA by PCR NEGATIVE  NEGATIVE   CBC      Status: Abnormal   Collection Time   05/06/11  7:00 AM      Component Value Range   WBC 4.2  4.0 - 10.5 (K/uL)   RBC 3.35 (*) 3.87 - 5.11 (MIL/uL)   Hemoglobin 11.3 (*) 12.0 - 15.0 (g/dL)   HCT 16.1 (*) 09.6 - 46.0 (%)   MCV 99.7  78.0 - 100.0 (fL)   MCH 33.7  26.0 - 34.0 (pg)   MCHC 33.8  30.0 - 36.0 (g/dL)   RDW 04.5  40.9 - 81.1 (%)   Platelets 153  150 - 400 (K/uL)  COMPREHENSIVE METABOLIC PANEL     Status: Abnormal   Collection Time   05/06/11  7:00 AM      Component Value Range   Sodium 137  135 - 145 (mEq/L)   Potassium 4.3  3.5 - 5.1 (mEq/L)   Chloride 95 (*) 96 - 112 (mEq/L)   CO2 25  19 - 32 (mEq/L)   Glucose, Bld 177 (*) 70 - 99 (mg/dL)   BUN 51 (*) 6 - 23 (mg/dL)   Creatinine, Ser 9.14 (*) 0.50 - 1.10 (mg/dL)   Calcium 78.2 (*) 8.4 - 10.5 (mg/dL)   Total Protein 6.5  6.0 - 8.3 (g/dL)   Albumin 3.3 (*) 3.5 - 5.2 (g/dL)   AST 18  0 - 37 (U/L)   ALT 12  0 - 35 (U/L)   Alkaline Phosphatase 130 (*) 39 - 117 (U/L)   Total Bilirubin 0.5  0.3 - 1.2 (mg/dL)   GFR calc non Af Amer 7 (*) >90 (mL/min)   GFR calc Af Amer 8 (*) >90 (mL/min)  GLUCOSE, CAPILLARY     Status: Abnormal   Collection Time   05/06/11  8:06 AM      Component Value Range   Glucose-Capillary 170 (*) 70 - 99 (mg/dL)  GLUCOSE, CAPILLARY     Status: Abnormal   Collection Time   05/06/11 11:52 AM      Component Value Range   Glucose-Capillary 203 (*) 70 - 99 (mg/dL)    Signed: Milbert Coulter, Florencia Zaccaro 05/06/2011, 5:03 PM  I spent >30 minutes on this discharge.

## 2011-05-06 NOTE — Evaluation (Signed)
Physical Therapy Evaluation Patient Details Name: [name] MRN: [id number] DOB: 1940-10-27 Today's Date: 05/06/2011  Problem List:  Patient Active Problem List  Diagnoses  . ESRD (end stage renal disease) on dialysis  . Dizziness  . Diabetes mellitus  . Hypertension    Past Medical History:  Past Medical History  Diagnosis Date  . Cancer   . ESRD on dialysis     dialysis  . Hypertension   . Diabetes mellitus   . Bladder cancer Jan 10, 2006    BLADDER NECK, BIOPSY: DIFFUSE LARGE B-CELL LYMPHOMA. CT showed worrisome pelvic LN. No treatment records in EPIC.   Past Surgical History:  Past Surgical History  Procedure Date  . Bladder cancer   . Renal failure     PT Assessment/Plan/Recommendation PT Assessment Clinical Impression Statement: Subjective questioning did not lead me to suspect BPPV, but tested for Posterior canal and Horizontal canal BPPV.  No s/s of vertigo or nystagmus note.  Very mild lightheadedness noted upon sitting back up from putting on her R sock. Mobility is mildly unsteady, but safe for home ambulation, esp with the recommended RW.  No follow up needs. PT Recommendation/Assessment: Patent does not need any further PT services PT Problem List: Decreased balance No Skilled PT: Patient will have necessary level of assist by caregiver at discharge PT Recommendation Follow Up Recommendations: No PT follow up Equipment Recommended: Rolling walker with 5" wheels (pt needs a juvenile RW for approx. 5 ' height) PT Goals     PT Evaluation Precautions/Restrictions  Precautions Precautions: Fall Restrictions Weight Bearing Restrictions: No Prior Functioning  Home Living Lives With: Spouse Receives Help From: Family Type of Home: Apartment Home Layout: One level Home Access: Level entry Bathroom Shower/Tub: Engineer, manufacturing systems: Standard Home Adaptive Equipment: Shower chair with back;Grab bars in shower;Grab bars around toilet Prior  Function Level of Independence: Needs assistance with homemaking;Needs assistance with ADLs Driving: No Vocation: Retired Financial risk analyst Arousal/Alertness: Awake/alert Overall Cognitive Status: Appears within functional limits for tasks assessed Orientation Level: Oriented to person;Oriented to place;Oriented to situation Sensation/Coordination Coordination Gross Motor Movements are Fluid and Coordinated: Yes Extremity Assessment RLE Assessment RLE Assessment: Within Functional Limits LLE Assessment LLE Assessment: Within Functional Limits (bil general weakness in hip flexors, generally symetrical ) Mobility (including Balance) Bed Mobility Bed Mobility: Yes Supine to Sit: 5: Supervision;HOB flat Supine to Sit Details (indicate cue type and reason): safety only Sitting - Scoot to Edge of Bed: 7: Independent Transfers Transfers: Yes Sit to Stand: 5: Supervision;With upper extremity assist;From bed Sit to Stand Details (indicate cue type and reason): vc's for safe hand placement, safe transfer technique with RW Stand to Sit: 5: Supervision Ambulation/Gait Ambulation/Gait: Yes Ambulation/Gait Assistance: Other (comment) (min guard A) Ambulation Distance (Feet): 190 Feet (then 40 feet to practice with the RW) Assistive device: 1 person hand held assist;None;Rolling walker Gait Pattern: Step-through pattern;Decreased step length - right;Decreased step length - left;Decreased stride length Stairs: No Wheelchair Mobility Wheelchair Mobility: No  Posture/Postural Control Posture/Postural Control: No significant limitations Balance Balance Assessed: Yes Static Standing Balance Rhomberg - Eyes Opened: 25  Rhomberg - Eyes Closed: 10  (easily held both) Exercise    End of Session PT - End of Session Activity Tolerance: Patient tolerated treatment well Patient left: Other (comment);with call bell in reach (sitting EOB) General Behavior During Session: [facility] for tasks  performed Cognition: [facility] for tasks performed  Paysen Goza, Eliseo Gum 05/06/2011, 3:57 PM  05/06/2011  [place] Bing, PT [phone] (  pager)

## 2011-05-08 ENCOUNTER — Other Ambulatory Visit: Payer: Self-pay | Admitting: Internal Medicine

## 2011-05-15 ENCOUNTER — Encounter: Payer: Medicare Other | Admitting: Internal Medicine

## 2011-05-15 ENCOUNTER — Other Ambulatory Visit: Payer: Self-pay | Admitting: Oncology

## 2011-05-23 ENCOUNTER — Encounter: Payer: Medicare Other | Admitting: Internal Medicine

## 2011-10-16 ENCOUNTER — Other Ambulatory Visit (HOSPITAL_BASED_OUTPATIENT_CLINIC_OR_DEPARTMENT_OTHER): Payer: Medicare Other | Admitting: Lab

## 2011-10-16 ENCOUNTER — Telehealth: Payer: Self-pay | Admitting: *Deleted

## 2011-10-16 ENCOUNTER — Ambulatory Visit (HOSPITAL_BASED_OUTPATIENT_CLINIC_OR_DEPARTMENT_OTHER): Payer: Medicare Other | Admitting: Oncology

## 2011-10-16 VITALS — BP 176/58 | HR 50 | Temp 97.8°F | Resp 20 | Ht 60.0 in | Wt 123.1 lb

## 2011-10-16 DIAGNOSIS — C8590 Non-Hodgkin lymphoma, unspecified, unspecified site: Secondary | ICD-10-CM

## 2011-10-16 DIAGNOSIS — R109 Unspecified abdominal pain: Secondary | ICD-10-CM

## 2011-10-16 DIAGNOSIS — C8589 Other specified types of non-Hodgkin lymphoma, extranodal and solid organ sites: Secondary | ICD-10-CM

## 2011-10-16 DIAGNOSIS — C8599 Non-Hodgkin lymphoma, unspecified, extranodal and solid organ sites: Secondary | ICD-10-CM | POA: Insufficient documentation

## 2011-10-16 LAB — CBC WITH DIFFERENTIAL/PLATELET
Basophils Absolute: 0 10*3/uL (ref 0.0–0.1)
EOS%: 5.8 % (ref 0.0–7.0)
HCT: 30.9 % — ABNORMAL LOW (ref 34.8–46.6)
HGB: 10.5 g/dL — ABNORMAL LOW (ref 11.6–15.9)
MCH: 33.8 pg (ref 25.1–34.0)
NEUT%: 54.2 % (ref 38.4–76.8)
lymph#: 1.4 10*3/uL (ref 0.9–3.3)

## 2011-10-16 LAB — COMPREHENSIVE METABOLIC PANEL (CC13)
Albumin: 3.6 g/dL (ref 3.5–5.0)
BUN: 38 mg/dL — ABNORMAL HIGH (ref 7.0–26.0)
CO2: 27 mEq/L (ref 22–29)
Calcium: 11.3 mg/dL — ABNORMAL HIGH (ref 8.4–10.4)
Chloride: 97 mEq/L — ABNORMAL LOW (ref 98–107)
Glucose: 296 mg/dl — ABNORMAL HIGH (ref 70–99)
Potassium: 4.9 mEq/L (ref 3.5–5.1)

## 2011-10-16 LAB — LACTATE DEHYDROGENASE (CC13): LDH: 162 U/L (ref 125–220)

## 2011-10-16 NOTE — Telephone Encounter (Signed)
Made patient appointment for ultrasound of the pelvis complete patient was given the instructions to have an full bladder for the procedure interpreter was present  Made patient appointment for 11-13-2011 at [facility] interpreter was present at the time the appointments were given to the patient

## 2011-10-16 NOTE — Progress Notes (Signed)
ID: [name]   DOB: April 12, 1940  MR#: [id number]  WUJ#:[id number]  PCP: No primary provider on file. GYN:  SU:  OTHER MD:   HISTORY OF PRESENT ILLNESS: The patient was originally referred in 2007, when she presented with gross hematuria.  Dr. Annabell Howells obtained CT urogram, which showed small kidneys bilaterally (the patient has end-stage renal disease) with no obvious stones or renal lesions.  In the bladder, there was a donut-shaped mass at the bladder neck and cystoscopy showed a smooth bladder without papillary lesions but on retroflexion there was a donut-like lesion relatively circumferential at the bladder neck with some bleeding on the posterior aspect.  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  The patient was referred for further evaluation but failed to show (I actually wrote her a letter in Spanish with a 2nd appointment but she failed to show for that appointment as well.).   More recently, the patient again developed gross hematuria.  She again went to see Dr. Annabell Howells and he again obtained a CT of the abdomen and pelvis without contrast.  Interestingly, some of the abdominal lymph nodes previously noted are now a little bit smaller.  The left ureter, which was questionably or intermittently dilated previously, is now clearly dilated to the level of S3, at which point it is difficult to follow.  There is bladder wall thickening, particularly on the right, and there is an enlarged left groin lymph node measuring 2.5 cm.  Otherwise, the left groin adenopathy is decreased compared to 2007. Her subsequent history is as detailed below.  INTERVAL HISTORY: [name] returns today with one of her daughters in lawl and a sister for followup of her non-Hodgkin's lymphoma. In general, the interval history is surprisingly stable. She continues to receive hemodialysis on Tuesdays, Thursdays, and Saturdays.  REVIEW OF SYSTEMS: The only new  symptoms she reports is a pain in the lower pelvis, which is relieved by urination. She only makes "a couple of drops", but that seems to take care of the problem. The pain is not intense and it is not constant. Some days it is not there are call. She denies fevers, drenching sweats, or unexplained fatigue. She has lost a total of 10 pounds in the last 6 years. She has been a little bit dizzy, and had a head CT performed March of this year, which showed no acute abnormality. She is chronically constipated. Otherwise a detailed review of systems today was stable.  PAST MEDICAL HISTORY: Past Medical History  Diagnosis Date  . Cancer   . ESRD on dialysis     dialysis  . Hypertension   . Diabetes mellitus   . Bladder cancer Jan 10, 2006    BLADDER NECK, BIOPSY: DIFFUSE LARGE B-CELL LYMPHOMA. CT showed worrisome pelvic LN. No treatment records in EPIC.  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  She has a fistula in the left upper extremity placed June of 2007.  Her diabetes, she says, is well controlled.  She is essentially blind secondary to that.  There is also a history of hypothyroidism.  PAST SURGICAL HISTORY: Past Surgical History  Procedure Date  . Bladder cancer   . Renal failure     FAMILY HISTORY Family History  Problem Relation Age of Onset  . Thyroid disease Mother   The patient's father  died at the age of 82 from "lung problems".  The patient's mother died at the age of 73 from pneumonia.  The patient has 3 sisters and 4 brothers.  She is not aware of a history of cancer in the family.    GYNECOLOGIC HISTORY: She had 7 children.  She does not recall when she went through the change of life.  She did not take hormones.  SOCIAL HISTORY: She was born in [place] in [place].  She has  lived in the [place] about 8 years.  Her husband, [name], is retired from Holiday representative and is present today, as are 2 children, Ship broker (who works in a factory) and Corporate investment banker (who works in [employer]).  The patient has more grandchildren than she can count, and innumerable great-grandchildren as well.     ADVANCED DIRECTIVES:  HEALTH MAINTENANCE: History  Substance Use Topics  . Smoking status: Never Smoker   . Smokeless tobacco: Not on file  . Alcohol Use: No     Colonoscopy:  PAP:  Bone density:  Lipid panel:  No Known Allergies  Current Outpatient Prescriptions  Medication Sig Dispense Refill  . amLODipine (NORVASC) 10 MG tablet Take 10 mg by mouth daily.      Marland Kitchen glyBURIDE (DIABETA) 5 MG tablet Take 5 mg by mouth daily with breakfast.      . labetalol (NORMODYNE) 200 MG tablet Take 400 mg by mouth 2 (two) times daily.      Marland Kitchen lisinopril (PRINIVIL,ZESTRIL) 10 MG tablet TAKE 1 TABLET BY MOUTH EVERY DAY  90 tablet  0  . sorbitol 70 % solution Take 15 mLs by mouth 2 (two) times daily as needed. For bowel movement  473 mL  0    OBJECTIVE: Elderly Hispanic woman in no acute distress Filed Vitals:   10/16/11 1438  BP: 176/58  Pulse: 50  Temp: 97.8 F (36.6 C)  Resp: 20     Body mass index is 24.04 kg/(m^2).    ECOG FS: 2 Filed Weights   10/16/11 1438  Weight: 123 lb 1.6 oz (55.838 kg)    Sclerae unicteric; she is essentially blind except to light and shapes Oropharynx clear No axillary or supraclavicular adenopathy Lungs no rales or rhonchi Heart regular rate and rhythm, harsh systolic murmur, 3/6 Abd slightly distended, positive bowel sounds, nontender, except in the left lower quadrant where there was mild tenderness to palpation. There was some left inguinal adenopathy which appeared unimpressive MSK no focal spinal tenderness, no peripheral edema Neuro: nonfocal Breasts: Deferred  LAB RESULTS: Lab Results  Component Value Date   WBC 4.2 05/06/2011   NEUTROABS  2.2 05/01/2011   HGB 11.3* 05/06/2011   HCT 33.4* 05/06/2011   MCV 99.7 05/06/2011   PLT 153 05/06/2011      Chemistry      Component Value Date/Time   NA 137 05/06/2011 0700   K 4.3 05/06/2011 0700   CL 95* 05/06/2011 0700   CO2 25 05/06/2011 0700   BUN 51* 05/06/2011 0700   CREATININE 5.54* 05/06/2011 0700      Component Value Date/Time   CALCIUM 11.0* 05/06/2011 0700   ALKPHOS 130* 05/06/2011 0700   AST 18 05/06/2011 0700   ALT 12 05/06/2011 0700   BILITOT 0.5 05/06/2011 0700       No results found for this basename: LABCA2    No components found with this basename: [id number]    No results found for this basename: INR:1;PROTIME:1 in the last 168  hours  Urinalysis No results found for this basename: colorurine, appearanceur, labspec, phurine, glucoseu, hgbur, bilirubinur, ketonesur, proteinur, urobilinogen, nitrite, leukocytesur    STUDIES: No results found.  ASSESSMENT: 71 y.o. Spanish speaker with a history of   (1) non-Hodgkin's lymphoma, diffuse large B-cell subtype, diagnosed through bladder neck biopsy in November of 2007, status post bladder radiation for hematuria control completed in April of 2010, but never treated otherwise.  (2) on hemodialysis on Tuesdays, Thursdays and Saturdays.    PLAN: The family wanted to know how the non-Hodgkin's lymphoma was doing. We went over the fact that the patient has refused treatment except for symptom control. Accordingly we are not closely following the lymphoma, and the most important thing to notices that it has been 6 years and she still alive and in pretty good shape considering all the medical problems that she has.  She is having a new symptom, which is likely the reason she came for this visit (after missing several other appointments). I suspect there is recurrence in the pelvis, and we are going to do a pelvic ultrasound to try to document that. She will see [place] again in approximately one month. Depending on what we find, we  can offer her some treatment, or continued observation. I gave her daughter-in-law my card and they know to call for any other problems that may develop before the next visit here.   MAGRINAT,GUSTAV C    10/16/2011

## 2011-10-16 NOTE — Telephone Encounter (Signed)
Gave patient appointment for 11-13-2011 at 1:45pm ultrasound pelvis 2:00pm

## 2011-10-20 ENCOUNTER — Other Ambulatory Visit: Payer: Self-pay | Admitting: Oncology

## 2011-10-30 ENCOUNTER — Other Ambulatory Visit: Payer: Self-pay | Admitting: Oncology

## 2011-10-30 ENCOUNTER — Ambulatory Visit (HOSPITAL_COMMUNITY)
Admission: RE | Admit: 2011-10-30 | Discharge: 2011-10-30 | Disposition: A | Discharge status: Home / Self Care | Payer: Medicare Other | Source: Ambulatory Visit | Attending: Oncology | Admitting: Oncology

## 2011-10-30 DIAGNOSIS — Z87898 Personal history of other specified conditions: Secondary | ICD-10-CM | POA: Insufficient documentation

## 2011-10-30 DIAGNOSIS — C8590 Non-Hodgkin lymphoma, unspecified, unspecified site: Secondary | ICD-10-CM

## 2011-10-30 DIAGNOSIS — R188 Other ascites: Secondary | ICD-10-CM | POA: Insufficient documentation

## 2011-10-30 DIAGNOSIS — N949 Unspecified condition associated with female genital organs and menstrual cycle: Secondary | ICD-10-CM | POA: Insufficient documentation

## 2011-10-30 DIAGNOSIS — Z8551 Personal history of malignant neoplasm of bladder: Secondary | ICD-10-CM | POA: Insufficient documentation

## 2011-11-13 ENCOUNTER — Ambulatory Visit (HOSPITAL_BASED_OUTPATIENT_CLINIC_OR_DEPARTMENT_OTHER): Payer: Medicare Other | Admitting: Physician Assistant

## 2011-11-13 ENCOUNTER — Telehealth: Payer: Self-pay | Admitting: Oncology

## 2011-11-13 ENCOUNTER — Encounter: Payer: Self-pay | Admitting: Physician Assistant

## 2011-11-13 VITALS — BP 223/61 | HR 52 | Temp 98.1°F | Resp 20 | Ht 60.0 in | Wt 128.8 lb

## 2011-11-13 DIAGNOSIS — Z992 Dependence on renal dialysis: Secondary | ICD-10-CM

## 2011-11-13 DIAGNOSIS — B029 Zoster without complications: Secondary | ICD-10-CM

## 2011-11-13 DIAGNOSIS — C8589 Other specified types of non-Hodgkin lymphoma, extranodal and solid organ sites: Secondary | ICD-10-CM

## 2011-11-13 DIAGNOSIS — N186 End stage renal disease: Secondary | ICD-10-CM

## 2011-11-13 DIAGNOSIS — C8590 Non-Hodgkin lymphoma, unspecified, unspecified site: Secondary | ICD-10-CM

## 2011-11-13 MED ORDER — VALACYCLOVIR HCL 500 MG PO TABS: 500.0000 mg | ORAL_TABLET | Freq: Every day | ORAL | Status: DC

## 2011-11-13 NOTE — Progress Notes (Signed)
ID: [name]   DOB: [birth year]  MR#: [id number]  NWG#:[id number]  PCP: No primary provider on file. GYN:  SU:  OTHER MD:   HISTORY OF PRESENT ILLNESS: The patient was originally referred in 2007, when she presented with gross hematuria.  Dr. Annabell Jordan obtained CT urogram, which showed small kidneys bilaterally (the patient has end-stage renal disease) with no obvious stones or renal lesions.  In the bladder, there was a donut-shaped mass at the bladder neck and cystoscopy showed a smooth bladder without papillary lesions but on retroflexion there was a donut-like lesion relatively circumferential at the bladder neck with some bleeding on the posterior aspect.  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  The patient was referred for further evaluation but failed to show (I actually wrote her a letter in Spanish with a 2nd appointment but she failed to show for that appointment as well.).   More recently, the patient again developed gross hematuria.  She again went to see Dr. Annabell Jordan and he again obtained a CT of the abdomen and pelvis without contrast.  Interestingly, some of the abdominal lymph nodes previously noted are now a little bit smaller.  The left ureter, which was questionably or intermittently dilated previously, is now clearly dilated to the level of S3, at which point it is difficult to follow.  There is bladder wall thickening, particularly on the right, and there is an enlarged left groin lymph node measuring 2.5 cm.  Otherwise, the left groin adenopathy is decreased compared to 2007. Her subsequent history is as detailed below.  INTERVAL HISTORY: [name] returns today with one of her daughters in law and a sister, as well as a Engineer, structural, for followup of her non-Hodgkin's lymphoma. In general, the interval history is unremarkable. She's had no additional hematuria or dysuria, and no increased pelvic pain since her last  visit here. Her only new complaint is a rash on the right side of her chest. She continues to receive hemodialysis on Tuesdays, Thursdays, and Saturdays.  REVIEW OF SYSTEMS: She denies fevers, drenching sweats, or unexplained fatigue. She's had no nausea, emesis, or change in bowel habits. She has chronic constipation. No cough or increased shortness of breath. No chest pain. No abnormal headaches, although she does continue to feel little dizzy at times. This is not new and has not worsened. She has no unusual myalgias, arthralgias, or bony pain.  In fact a detailed review of systems is otherwise stable and noncontributory today.  PAST MEDICAL HISTORY: Past Medical History  Diagnosis Date  . Cancer   . ESRD on dialysis     dialysis  . Hypertension   . Diabetes mellitus   . Bladder cancer Jan 10, 2006    BLADDER NECK, BIOPSY: DIFFUSE LARGE B-CELL LYMPHOMA. CT showed worrisome pelvic LN. No treatment records in EPIC.  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  She has a fistula in the left upper extremity placed June of 2007.  Her diabetes, she says, is well controlled.  She is essentially blind secondary to that.  There is also a history of hypothyroidism.  PAST SURGICAL HISTORY: Past Surgical History  Procedure Date  . Bladder cancer   . Renal failure     FAMILY HISTORY Family History  Problem Relation Age of Onset  . Thyroid disease Mother  The patient's father died at the age of 20 from "lung problems".  The patient's mother died at the age of 86 from pneumonia.  The patient has 3 sisters and 4 brothers.  She is not aware of a history of cancer in the family.    GYNECOLOGIC HISTORY: She had 7 children.  She does not recall when she went through the change of life.  She did not take  hormones.  SOCIAL HISTORY: She was born in [place] in [place].  She has lived in the [place] about 8 years.  Her husband, [name], is retired from Holiday [employer] and is present today, as are 2 children, Ship broker (who works in a factory) and Corporate investment banker (who works in [employer]).  The patient has more grandchildren than she can count, and innumerable great-grandchildren as well.     ADVANCED DIRECTIVES:  HEALTH MAINTENANCE: History  Substance Use Topics  . Smoking status: Never Smoker   . Smokeless tobacco: Not on file  . Alcohol Use: No     Colonoscopy:  PAP:  Bone density:  Lipid panel:  No Known Allergies  Current Outpatient Prescriptions  Medication Sig Dispense Refill  . amLODipine (NORVASC) 10 MG tablet Take 10 mg by mouth daily.      Marland Kitchen glyBURIDE (DIABETA) 5 MG tablet Take 5 mg by mouth daily with breakfast.      . labetalol (NORMODYNE) 200 MG tablet Take 400 mg by mouth 2 (two) times daily.      Marland Kitchen lisinopril (PRINIVIL,ZESTRIL) 10 MG tablet TAKE 1 TABLET BY MOUTH EVERY DAY  90 tablet  0  . sorbitol 70 % solution Take 15 mLs by mouth 2 (two) times daily as needed. For bowel movement  473 mL  0  . valACYclovir (VALTREX) 500 MG tablet Take 1 tablet (500 mg total) by mouth daily.  7 tablet  0    OBJECTIVE: Elderly Hispanic woman in no acute distress Filed Vitals:   11/13/11 1408  BP: 223/61  Pulse: 52  Temp: 98.1 F (36.7 C)  Resp: 20     Body mass index is 25.15 kg/(m^2).    ECOG FS: 1 Filed Weights   11/13/11 1408  Weight: 128 lb 12.8 oz (58.423 kg)   Sclerae unicteric; she is essentially blind except to light and shapes Oropharynx clear No axillary or supraclavicular adenopathy Lungs no rales or rhonchi Heart regular rate and rhythm, harsh systolic murmur Abd slightly distended, positive bowel sounds, nontender to palpation.  MSK no focal spinal tenderness, no peripheral edema Neuro: nonfocal Breasts: Deferred Skin:  Erythematous rash present on the  right upper chest wall with several vesicles present  LAB RESULTS: Lab Results  Component Value Date   WBC 4.5 10/16/2011   NEUTROABS 2.4 10/16/2011   HGB 10.5* 10/16/2011   HCT 30.9* 10/16/2011   MCV 99.4 10/16/2011   PLT 139* 10/16/2011      Chemistry      Component Value Date/Time   NA 139 10/16/2011 1425   NA 137 05/06/2011 0700   K 4.9 10/16/2011 1425   K 4.3 05/06/2011 0700   CL 97* 10/16/2011 1425   CL 95* 05/06/2011 0700   CO2 27 10/16/2011 1425   CO2 25 05/06/2011 0700   BUN 38.0* 10/16/2011 1425   BUN 51* 05/06/2011 0700   CREATININE 6.0 Repeated and Verified* 10/16/2011 1425   CREATININE 5.54* 05/06/2011 0700      Component Value Date/Time   CALCIUM 11.3* 10/16/2011 1425   CALCIUM 11.0*  05/06/2011 0700   ALKPHOS 145 10/16/2011 1425   ALKPHOS 130* 05/06/2011 0700   AST 12 10/16/2011 1425   AST 18 05/06/2011 0700   ALT 11 10/16/2011 1425   ALT 12 05/06/2011 0700   BILITOT 0.70 10/16/2011 1425   BILITOT 0.5 05/06/2011 0700     LDH: 162 10/16/2011   STUDIES:  US Pelvis Limited  10/30/2011  *RADIOLOGY REPORT*  Clinical Data: Pelvic pain.  History of lymphoma and bladder cancer.  US PELVIS LIMITED OR FOLLOW UP  Technique:  Transabdominal ultrasound examination of the pelvis was performed including evaluation of the uterus, ovaries, adnexal regions, and pelvic cul-de-sac.  Comparison: No recent exams.  Findings: Small amount of ascites which may be loculated.  Evaluation is otherwise limited. If further delineation is clinically desired, follow-up CT may be considered.  IMPRESSION: Small amount of ascites which may be loculated.  Evaluation is otherwise limited.   Original Report Authenticated By: Fuller Canada, M.D.     ASSESSMENT: 71 y.o. Spanish speaker with a history of   (1) non-Hodgkin's lymphoma, diffuse large B-cell subtype, diagnosed through bladder neck biopsy in November of 2007, status post bladder radiation for hematuria control completed in April of 2010, but never treated  otherwise.  (2) on hemodialysis on Tuesdays, Thursdays and Saturdays.    PLAN:  This case was reviewed with Dr. Darnelle Catalan, and the recent ultrasound shows no significant reason to pursue therapy.  Our plan has been to treat [name] for symptoms only per her request as she has declined treatment for the lymphoma in the past.   At this time, she is having no specific complaints other than the rash which appears to be shingles. We will treat the shingles with a reduced dose of Valtrex. With a creatinine clearance of 8.2, her dose will be 500 mg daily for 7 days, taken after hemodialysis. She was given a prescription for the Valtrex along with instructions on how to take this appropriately. She and her family voiced understanding.  We will see [name] in approximately 6 months for routine followup, but she and her family know to call prior to that time with any changes or problems.   Gerardo Caiazzo    11/13/2011

## 2011-11-13 NOTE — Telephone Encounter (Signed)
gv pt appt schedule for April 2014.  °

## 2012-04-08 ENCOUNTER — Observation Stay (HOSPITAL_COMMUNITY)
Admission: EM | Admit: 2012-04-08 | Discharge: 2012-04-09 | Disposition: A | Discharge status: Home / Self Care | Payer: Medicare Other | Attending: Internal Medicine | Admitting: Internal Medicine

## 2012-04-08 ENCOUNTER — Encounter (HOSPITAL_COMMUNITY): Payer: Self-pay | Admitting: *Deleted

## 2012-04-08 ENCOUNTER — Emergency Department (HOSPITAL_COMMUNITY): Payer: Medicare Other

## 2012-04-08 DIAGNOSIS — D649 Anemia, unspecified: Secondary | ICD-10-CM | POA: Insufficient documentation

## 2012-04-08 DIAGNOSIS — M545 Low back pain, unspecified: Principal | ICD-10-CM | POA: Insufficient documentation

## 2012-04-08 DIAGNOSIS — N186 End stage renal disease: Secondary | ICD-10-CM

## 2012-04-08 DIAGNOSIS — E119 Type 2 diabetes mellitus without complications: Secondary | ICD-10-CM | POA: Insufficient documentation

## 2012-04-08 DIAGNOSIS — I120 Hypertensive chronic kidney disease with stage 5 chronic kidney disease or end stage renal disease: Secondary | ICD-10-CM | POA: Insufficient documentation

## 2012-04-08 DIAGNOSIS — J90 Pleural effusion, not elsewhere classified: Secondary | ICD-10-CM

## 2012-04-08 DIAGNOSIS — R188 Other ascites: Secondary | ICD-10-CM

## 2012-04-08 DIAGNOSIS — I10 Essential (primary) hypertension: Secondary | ICD-10-CM | POA: Diagnosis present

## 2012-04-08 DIAGNOSIS — R0789 Other chest pain: Secondary | ICD-10-CM | POA: Insufficient documentation

## 2012-04-08 DIAGNOSIS — R9431 Abnormal electrocardiogram [ECG] [EKG]: Secondary | ICD-10-CM | POA: Diagnosis present

## 2012-04-08 DIAGNOSIS — N2581 Secondary hyperparathyroidism of renal origin: Secondary | ICD-10-CM | POA: Insufficient documentation

## 2012-04-08 DIAGNOSIS — Z8551 Personal history of malignant neoplasm of bladder: Secondary | ICD-10-CM | POA: Insufficient documentation

## 2012-04-08 DIAGNOSIS — Z992 Dependence on renal dialysis: Secondary | ICD-10-CM | POA: Diagnosis present

## 2012-04-08 DIAGNOSIS — M546 Pain in thoracic spine: Secondary | ICD-10-CM | POA: Insufficient documentation

## 2012-04-08 DIAGNOSIS — C8589 Other specified types of non-Hodgkin lymphoma, extranodal and solid organ sites: Secondary | ICD-10-CM | POA: Insufficient documentation

## 2012-04-08 HISTORY — DX: Secondary hyperparathyroidism of renal origin: N25.81

## 2012-04-08 LAB — COMPREHENSIVE METABOLIC PANEL
ALT: 10 U/L (ref 0–35)
AST: 20 U/L (ref 0–37)
Albumin: 3.4 g/dL — ABNORMAL LOW (ref 3.5–5.2)
Alkaline Phosphatase: 149 U/L — ABNORMAL HIGH (ref 39–117)
BUN: 30 mg/dL — ABNORMAL HIGH (ref 6–23)
Chloride: 97 mEq/L (ref 96–112)
Potassium: 3.6 mEq/L (ref 3.5–5.1)
Sodium: 137 mEq/L (ref 135–145)
Total Protein: 6.7 g/dL (ref 6.0–8.3)

## 2012-04-08 LAB — CBC WITH DIFFERENTIAL/PLATELET
Lymphocytes Relative: 29 % (ref 12–46)
Lymphs Abs: 1.2 10*3/uL (ref 0.7–4.0)
Neutrophils Relative %: 57 % (ref 43–77)
Platelets: 151 10*3/uL (ref 150–400)
RBC: 3.32 MIL/uL — ABNORMAL LOW (ref 3.87–5.11)
WBC: 4.2 10*3/uL (ref 4.0–10.5)

## 2012-04-08 LAB — POCT I-STAT TROPONIN I: Troponin i, poc: 0.04 ng/mL (ref 0.00–0.08)

## 2012-04-08 MED ORDER — TECHNETIUM TC 99M DIETHYLENETRIAME-PENTAACETIC ACID: 40.0000 | Freq: Once | INTRAVENOUS | Status: DC | PRN

## 2012-04-08 MED ORDER — LABETALOL HCL 200 MG PO TABS
200.0000 mg | ORAL_TABLET | Freq: Once | ORAL | Status: AC
  Administered 2012-04-08: 200 mg via ORAL
  Filled 2012-04-08: qty 1

## 2012-04-08 MED ORDER — MORPHINE SULFATE 4 MG/ML IJ SOLN
4.0000 mg | INTRAMUSCULAR | Status: DC | PRN
  Filled 2012-04-08: qty 1

## 2012-04-08 MED ORDER — TECHNETIUM TO 99M ALBUMIN AGGREGATED
6.0000 | Freq: Once | INTRAVENOUS | Status: AC | PRN
  Administered 2012-04-08: 6 via INTRAVENOUS

## 2012-04-08 MED ORDER — ASPIRIN 81 MG PO CHEW
324.0000 mg | CHEWABLE_TABLET | Freq: Once | ORAL | Status: AC
  Administered 2012-04-08: 324 mg via ORAL
  Filled 2012-04-08: qty 3
  Filled 2012-04-08: qty 1

## 2012-04-08 MED ORDER — LABETALOL HCL 200 MG PO TABS
200.0000 mg | ORAL_TABLET | Freq: Two times a day (BID) | ORAL | Status: DC
  Filled 2012-04-08 (×3): qty 1

## 2012-04-08 MED ORDER — NITROGLYCERIN 0.4 MG SL SUBL
0.4000 mg | SUBLINGUAL_TABLET | SUBLINGUAL | Status: AC | PRN
  Administered 2012-04-08 (×3): 0.4 mg via SUBLINGUAL
  Filled 2012-04-08: qty 25

## 2012-04-08 NOTE — ED Notes (Addendum)
Reports given to [facility]. Concern about blood pressure 221/56 and she will ask floor charge nurse if pt could still come to the floor.

## 2012-04-08 NOTE — ED Provider Notes (Signed)
History     CSN: [id number]  Arrival date & time 04/08/12  1150   First MD Initiated Contact with Patient 04/08/12 1322      Chief Complaint  Patient presents with  . Left scapula and back pain     HPI Pt was seen at 1340.   Per pt and her family, c/o gradual onset and persistence of constant left sided thoracic back "pain" for the past 1 week.  Pt states the pain worsens with deep breath.  Has been associated with cough and occasional radiation to her left chest and down to her low back.  States she has been going to her usual HD treatments on Tu, Th, Sat (has been on HD approx 7 years).  Denies CP/palpitations, no fevers, no abd pain, no N/V/D, no rash, no injury.   Past Medical History  Diagnosis Date  . Cancer   . ESRD on dialysis     dialysis  . Hypertension   . Diabetes mellitus   . Bladder cancer Jan 10, 2006    BLADDER NECK, BIOPSY: DIFFUSE LARGE B-CELL LYMPHOMA. CT showed worrisome pelvic LN. No treatment records in EPIC.    Past Surgical History  Procedure Laterality Date  . Bladder cancer    . Renal failure      Family History  Problem Relation Age of Onset  . Thyroid disease Mother     History  Substance Use Topics  . Smoking status: Never Smoker   . Smokeless tobacco: Not on file  . Alcohol Use: No     Review of Systems ROS: Statement: All systems negative except as marked or noted in the HPI; Constitutional: Negative for fever and chills. ; ; Eyes: Negative for eye pain, redness and discharge. ; ; ENMT: Negative for ear pain, hoarseness, nasal congestion, sinus pressure and sore throat. ; ; Cardiovascular: Negative for chest pain, palpitations, diaphoresis, dyspnea and peripheral edema. ; ; Respiratory: +cough. Negative for wheezing and stridor. ; ; Gastrointestinal: Negative for nausea, vomiting, diarrhea, abdominal pain, blood in stool, hematemesis, jaundice and rectal bleeding. . ; ; Genitourinary: Negative for dysuria, flank pain and hematuria. ; ;  Musculoskeletal: +thoracic back pain. Negative for neck pain. Negative for swelling and trauma.; ; Skin: Negative for pruritus, rash, abrasions, blisters, bruising and skin lesion.; ; Neuro: Negative for headache, lightheadedness and neck stiffness. Negative for weakness, altered level of consciousness , altered mental status, extremity weakness, paresthesias, involuntary movement, seizure and syncope.      Allergies  Review of patient's allergies indicates no known allergies.  Home Medications   Current Outpatient Rx  Name  Route  Sig  Dispense  Refill  . glyBURIDE (DIABETA) 5 MG tablet   Oral   Take 5 mg by mouth daily with breakfast.         . labetalol (NORMODYNE) 200 MG tablet   Oral   Take 200 mg by mouth 2 (two) times daily.          . sorbitol 70 % solution   Oral   Take 15 mLs by mouth 2 (two) times daily as needed. For bowel movement   473 mL   0     BP 212/59  Pulse 58  Temp(Src) 97.9 F (36.6 C) (Oral)  Resp 18  SpO2 100%  Physical Exam 1345: Physical examination:  Nursing notes reviewed; Vital signs and O2 SAT reviewed;  Constitutional: Well developed, Well nourished, Well hydrated, In no acute distress; Head:  Normocephalic, atraumatic; Eyes: EOMI,  PERRL, No scleral icterus; ENMT: Mouth and pharynx normal, Mucous membranes moist; Neck: Supple, Full range of motion, No lymphadenopathy; Cardiovascular: Regular rate and rhythm, No gallop; Respiratory: Breath sounds clear & equal bilaterally, No rales, rhonchi, wheezes.  Speaking full sentences with ease, Normal respiratory effort/excursion; Chest: Nontender, Movement normal; Abdomen: Soft, Nontender, Nondistended, Normal bowel sounds; Genitourinary: No CVA tenderness; Spine:  No midline CS, TS, LS tenderness.  +TTP left thoracic paraspinal muscles, no rash.;; Extremities: Pulses normal, No tenderness, No edema, No calf edema or asymmetry.; Neuro: AA&Ox3, Major CN grossly intact.  Speech clear. No gross focal motor  or sensory deficits in extremities.; Skin: Color normal, Warm, Dry.   ED Course  Procedures    MDM  MDM Reviewed: previous chart, nursing note and vitals Reviewed previous: labs and ECG Interpretation: labs, ECG, x-ray and CT scan      Date: 04/08/2012  Rate: 58  Rhythm: sinus bradycardia  QRS Axis: normal  Intervals: PR prolonged  ST/T Wave abnormalities: nonspecific ST/T changes, ST depressions and TWI leads V4-V6, I, II, aVL  Conduction Disutrbances:first-degree A-V block   Narrative Interpretation:   Old EKG Reviewed: changes noted, deeper ST depressions inferior-lateral leads from previous EKG dated 05/05/2011.   Results for orders placed during the hospital encounter of 04/08/12  CBC WITH DIFFERENTIAL      Result Value Range   WBC 4.2  4.0 - 10.5 K/uL   RBC 3.32 (*) 3.87 - 5.11 MIL/uL   Hemoglobin 10.9 (*) 12.0 - 15.0 g/dL   HCT 11.9 (*) 14.7 - 82.9 %   MCV 94.9  78.0 - 100.0 fL   MCH 32.8  26.0 - 34.0 pg   MCHC 34.6  30.0 - 36.0 g/dL   RDW 56.2  13.0 - 86.5 %   Platelets 151  150 - 400 K/uL   Neutrophils Relative 57  43 - 77 %   Neutro Abs 2.4  1.7 - 7.7 K/uL   Lymphocytes Relative 29  12 - 46 %   Lymphs Abs 1.2  0.7 - 4.0 K/uL   Monocytes Relative 7  3 - 12 %   Monocytes Absolute 0.3  0.1 - 1.0 K/uL   Eosinophils Relative 6 (*) 0 - 5 %   Eosinophils Absolute 0.2  0.0 - 0.7 K/uL   Basophils Relative 1  0 - 1 %   Basophils Absolute 0.1  0.0 - 0.1 K/uL  COMPREHENSIVE METABOLIC PANEL      Result Value Range   Sodium 137  135 - 145 mEq/L   Potassium 3.6  3.5 - 5.1 mEq/L   Chloride 97  96 - 112 mEq/L   CO2 28  19 - 32 mEq/L   Glucose, Bld 192 (*) 70 - 99 mg/dL   BUN 30 (*) 6 - 23 mg/dL   Creatinine, Ser 7.84 (*) 0.50 - 1.10 mg/dL   Calcium 69.6 (*) 8.4 - 10.5 mg/dL   Total Protein 6.7  6.0 - 8.3 g/dL   Albumin 3.4 (*) 3.5 - 5.2 g/dL   AST 20  0 - 37 U/L   ALT 10  0 - 35 U/L   Alkaline Phosphatase 149 (*) 39 - 117 U/L   Total Bilirubin 0.4  0.3 - 1.2  mg/dL   GFR calc non Af Amer 8 (*) >90 mL/min   GFR calc Af Amer 10 (*) >90 mL/min  POCT I-STAT TROPONIN I      Result Value Range   Troponin i, poc 0.04  0.00 -  0.08 ng/mL   Comment 3            Dg Chest 2 View 04/08/2012  *RADIOLOGY REPORT*  Clinical Data: Chest pain.  CHEST - 2 VIEW  Comparison: PA and lateral chest 05/05/2011.  Findings: There is marked cardiomegaly without pulmonary edema. The patient has a trace left pleural effusion versus pleural scar. Aeration in the left lung base is markedly improved since the prior study.  The right lung is clear.  No pneumothorax.  IMPRESSION:  1.  Marked cardiomegaly without pulmonary edema. 2.  Small left pleural effusion versus scar.   Original Report Authenticated By: Holley Dexter, M.D.    Ct Chest Wo Contrast 04/08/2012  *RADIOLOGY REPORT*  Clinical Data: Left back pain.  History of pleural effusion.  CT CHEST WITHOUT CONTRAST  Technique:  Multidetector CT imaging of the chest was performed following the standard protocol without IV contrast.  Comparison: Plain films 04/08/2012.  Chest CT 07/11/2005  Findings: Small to moderate left pleural effusion.  There is cardiomegaly.  Extensive coronary artery calcifications.  Aorta is normal caliber with moderate atherosclerotic change. No mediastinal, hilar, or axillary adenopathy. Scattered small mediastinal lymph nodes, none pathologically enlarged.  Mild vascular congestion.  Compressive atelectasis in the left lower lobe.  No overt edema.  Imaging into the upper abdomen demonstrates mild perihepatic and perisplenic ascites. No acute bony abnormality.  Mild degenerative changes in the thoracic spine.  IMPRESSION: Small to moderate left pleural effusion.  Minimal left base atelectasis.  Cardiomegaly, vascular congestion.  Upper abdominal ascites.   Original Report Authenticated By: Charlett Nose, M.D.    Nm Pulmonary Perf And Vent 04/08/2012  *RADIOLOGY REPORT*  Clinical Data:  72 year old female with chest  and left back pain.  NUCLEAR MEDICINE VENTILATION - PERFUSION LUNG SCAN  Technique:  Ventilation images were obtained in multiple projections using inhaled aerosol technetium 99 M DTPA.  Perfusion images were obtained in multiple projections after intravenous injection of Tc-63m MAA.  Radiopharmaceuticals:  Tc-87m DTPA aerosol and 6.0 mCi Tc-7m MAA.  Comparison: 04/08/2012 chest radiograph  Findings:  Ventilation:   No focal ventilation defect.  Perfusion:   No wedge shaped peripheral perfusion defects to suggest acute pulmonary embolism  IMPRESSION: No evidence of pulmonary emboli.   Original Report Authenticated By: Harmon Pier, M.D.    US Aorta 04/08/2012  *RADIOLOGY REPORT*  Clinical Data:  Abdominal and back pain.  ULTRASOUND OF ABDOMINAL AORTA  Technique:  Ultrasound examination of the abdominal aorta was performed to evaluate for abdominal aortic aneurysm.  Comparison: Ultrasound 10/30/2011.  CT 04/06/2008  Abdominal Aorta:  Mild atherosclerotic irregularity in the aorta. No aneurysm identified.        Maximum AP diameter:  2.4 cm.       Maximum TRV diameter:  2.4 cm.  Mild to moderate ascites visualized in all quadrants of the abdomen/pelvis.  IMPRESSION: No evidence of abdominal aortic aneurysm.  Incidentally noted is mild to moderate ascites.   Original Report Authenticated By: Charlett Nose, M.D.    Results for [name], [name] (MRN [id number]) as of 04/08/2012 20:30  Ref. Range 05/01/2011 19:30 05/05/2011 11:56 05/06/2011 07:00 10/16/2011 14:25 04/08/2012 14:16  Hemoglobin Latest Range: 12.0-15.0 g/dL 95.6 (L) 21.3 (L) 08.6 (L) 10.5 (L) 10.9 (L)  HCT Latest Range: 36.0-46.0 % 35.0 (L) 34.1 (L) 33.4 (L) 30.9 (L) 31.5 (L)      2010:  Improved after ASA, ntg, and morphine.  Ca corrects to 11.1 for albumin; will tx with judicious  IVF. EKG with deeper ST depressions and TWI from previous. Denies CP. Dx and testing d/w pt and family.  Questions answered.  Verb understanding, agreeable to observation  admit.  T/C to [facility] Dr. Onalee Hua, case discussed, including:  HPI, pertinent PM/SHx, VS/PE, dx testing, ED course and treatment:  Agreeable to come to ED for eval to admit.           Laray Anger, DO 04/10/12 1200

## 2012-04-08 NOTE — H&P (Signed)
PCP:   Quentin Mulling, MD   Chief Complaint:  Back pain  HPI: 72 yo female esrd dialysis dep t/r/sat, htn, comes in with lower back pain for about a week now.  She does not know if she pulled something.  No recent falls or trauma.  No fevers.  No dysuria.  Her pain has been treated in the ED and is improved.  She denies cp, sob, n/v.  She denies any weakness in her arms/legs, no muscle twitching, no aches elsewhere but her lower back.  Family is present and patient is mainly spanish speaking, communication is aided by several english speaking family members who are present.  Pt has had an extensive w/u in the ED for her back pain today including aorta u/s, ct chest, and vq scan.  She also had a 12 lead ekg which shows some st seg depression in lateral leads which is different than previous ekg a year ago.  She also has mild hypercalcemia which is asymptomatic and chronic.  Asked to admit patient by Dr. Clarene Duke in ED for her high calcium level and abnormal ekg.    Review of Systems:  Positive and negative as per HPI otherwise all other systems are negative  Past Medical History: Past Medical History  Diagnosis Date  . Cancer   . ESRD on dialysis     dialysis  . Hypertension   . Diabetes mellitus   . Bladder cancer Jan 10, 2006    BLADDER NECK, BIOPSY: DIFFUSE LARGE B-CELL LYMPHOMA. CT showed worrisome pelvic LN. No treatment records in EPIC.   Past Surgical History  Procedure Laterality Date  . Bladder cancer    . Renal failure      Medications: Prior to Admission medications   Medication Sig Start Date End Date Taking? Authorizing Provider  glyBURIDE (DIABETA) 5 MG tablet Take 5 mg by mouth daily with breakfast.   Yes Historical Provider, MD  labetalol (NORMODYNE) 200 MG tablet Take 200 mg by mouth 2 (two) times daily.    Yes Historical Provider, MD  sorbitol 70 % solution Take 15 mLs by mouth 2 (two) times daily as needed. For bowel movement 05/06/11  Yes Neema Davina Poke, MD     Allergies:  No Known Allergies  Social History:  reports that she has never smoked. She does not have any smokeless tobacco history on file. She reports that she does not drink alcohol or use illicit drugs.  Family History: Family History  Problem Relation Age of Onset  . Thyroid disease Mother     Physical Exam: Filed Vitals:   04/08/12 1212 04/08/12 1321 04/08/12 1440 04/08/12 2124  BP: 210/55 207/56 212/59 221/58  Pulse: 58   59  Temp: 97.9 F (36.6 C)   98.7 F (37.1 C)  TempSrc: Oral   Oral  Resp: 18 14 18 14   SpO2: 97%  100% 97%   General appearance: alert, cooperative and no distress Head: Normocephalic, without obvious abnormality, atraumatic Eyes: negative Nose: Nares normal. Septum midline. Mucosa normal. No drainage or sinus tenderness. Neck: no JVD and supple, symmetrical, trachea midline Back: symmetric, no curvature. ROM normal. No CVA tenderness. Lungs: clear to auscultation bilaterally Heart: regular rate and rhythm, S1, S2 normal, no click, rub or gallop 3/6 SEM Abdomen: soft, non-tender; bowel sounds normal; no masses,  no organomegaly Extremities: extremities normal, atraumatic, no cyanosis or edema Pulses: 2+ and symmetric Skin: Skin color, texture, turgor normal. No rashes or lesions Neurologic: Grossly normal  Labs on Admission:   Recent Labs  04/08/12 1416  NA 137  K 3.6  CL 97  CO2 28  GLUCOSE 192*  BUN 30*  CREATININE 4.83*  CALCIUM 10.6*    Recent Labs  04/08/12 1416  AST 20  ALT 10  ALKPHOS 149*  BILITOT 0.4  PROT 6.7  ALBUMIN 3.4*    Recent Labs  04/08/12 1416  WBC 4.2  NEUTROABS 2.4  HGB 10.9*  HCT 31.5*  MCV 94.9  PLT 151    Radiological Exams on Admission: Dg Chest 2 View  04/08/2012  *RADIOLOGY REPORT*  Clinical Data: Chest pain.  CHEST - 2 VIEW  Comparison: PA and lateral chest 05/05/2011.  Findings: There is marked cardiomegaly without pulmonary edema. The patient has a trace left pleural effusion  versus pleural scar. Aeration in the left lung base is markedly improved since the prior study.  The right lung is clear.  No pneumothorax.  IMPRESSION:  1.  Marked cardiomegaly without pulmonary edema. 2.  Small left pleural effusion versus scar.   Original Report Authenticated By: Holley Dexter, M.D.    Ct Chest Wo Contrast  04/08/2012  *RADIOLOGY REPORT*  Clinical Data: Left back pain.  History of pleural effusion.  CT CHEST WITHOUT CONTRAST  Technique:  Multidetector CT imaging of the chest was performed following the standard protocol without IV contrast.  Comparison: Plain films 04/08/2012.  Chest CT 07/11/2005  Findings: Small to moderate left pleural effusion.  There is cardiomegaly.  Extensive coronary artery calcifications.  Aorta is normal caliber with moderate atherosclerotic change. No mediastinal, hilar, or axillary adenopathy. Scattered small mediastinal lymph nodes, none pathologically enlarged.  Mild vascular congestion.  Compressive atelectasis in the left lower lobe.  No overt edema.  Imaging into the upper abdomen demonstrates mild perihepatic and perisplenic ascites. No acute bony abnormality.  Mild degenerative changes in the thoracic spine.  IMPRESSION: Small to moderate left pleural effusion.  Minimal left base atelectasis.  Cardiomegaly, vascular congestion.  Upper abdominal ascites.   Original Report Authenticated By: Charlett Nose, M.D.    Nm Pulmonary Perf And Vent  04/08/2012  *RADIOLOGY REPORT*  Clinical Data:  72 year old female with chest and left back pain.  NUCLEAR MEDICINE VENTILATION - PERFUSION LUNG SCAN  Technique:  Ventilation images were obtained in multiple projections using inhaled aerosol technetium 99 M DTPA.  Perfusion images were obtained in multiple projections after intravenous injection of Tc-47m MAA.  Radiopharmaceuticals:  Tc-60m DTPA aerosol and 6.0 mCi Tc-13m MAA.  Comparison: 04/08/2012 chest radiograph  Findings:  Ventilation:   No focal ventilation  defect.  Perfusion:   No wedge shaped peripheral perfusion defects to suggest acute pulmonary embolism  IMPRESSION: No evidence of pulmonary emboli.   Original Report Authenticated By: Harmon Pier, M.D.    US Aorta  04/08/2012  *RADIOLOGY REPORT*  Clinical Data:  Abdominal and back pain.  ULTRASOUND OF ABDOMINAL AORTA  Technique:  Ultrasound examination of the abdominal aorta was performed to evaluate for abdominal aortic aneurysm.  Comparison: Ultrasound 10/30/2011.  CT 04/06/2008  Abdominal Aorta:  Mild atherosclerotic irregularity in the aorta. No aneurysm identified.        Maximum AP diameter:  2.4 cm.       Maximum TRV diameter:  2.4 cm.  Mild to moderate ascites visualized in all quadrants of the abdomen/pelvis.  IMPRESSION: No evidence of abdominal aortic aneurysm.  Incidentally noted is mild to moderate ascites.   Original Report Authenticated By: Charlett Nose, M.D.  Assessment/Plan 72 yo female with lumbar back pain, with chronic asymtomatic hypercalcemia and abnormal ekg  Principal Problem:   Abnormal EKG Active Problems:   ESRD (end stage renal disease) on dialysis   Diabetes mellitus   Hypertension   Hypercalcemia associated with chronic dialysis  obs pt for echo in am.  Called nephro unit as pt will need dialysis in am.  i have tried to contact cardiology service on call tonight several times without success to arrange outpt stress testing as pt does not want to be hospitalized.  She has agreed to stay in order to arrange this.  She is not even sure if she wants a cardiac w/u with stress testing or cath because she will not want anything done about it.  obs on tele.  Call cards again if pt agrees to proceed with w/u.  DAVID,RACHAL A 04/08/2012, 10:34 PM

## 2012-04-08 NOTE — ED Notes (Signed)
Pharmacy called to send labetalol, will verify and then send to [facility].

## 2012-04-08 NOTE — ED Notes (Signed)
For one week has had pain to left scapula area and down back for one week.  PT states hurts to take deep breath.  Denies chest pain, no sob.  Pt is on HD.  Pulse seems weaker on left radial.  Pt appears weak, but alert and appropriate

## 2012-04-09 ENCOUNTER — Encounter (HOSPITAL_COMMUNITY): Payer: Self-pay | Admitting: General Practice

## 2012-04-09 LAB — TROPONIN I: Troponin I: <0.3 ng/mL (ref <0.30)

## 2012-04-09 LAB — GLUCOSE, CAPILLARY
Glucose-Capillary: 170 mg/dL — ABNORMAL HIGH (ref 70–99)
Glucose-Capillary: 223 mg/dL — ABNORMAL HIGH (ref 70–99)

## 2012-04-09 LAB — HEPATITIS B SURFACE ANTIGEN: Hepatitis B Surface Ag: NEGATIVE

## 2012-04-09 MED ORDER — SODIUM CHLORIDE 0.9 % IV SOLN: 100.0000 mL | INTRAVENOUS | Status: DC | PRN

## 2012-04-09 MED ORDER — INSULIN ASPART 100 UNIT/ML ~~LOC~~ SOLN: 0.0000 [IU] | Freq: Three times a day (TID) | SUBCUTANEOUS | Status: DC

## 2012-04-09 MED ORDER — HEPARIN SODIUM (PORCINE) 1000 UNIT/ML DIALYSIS: 1000.0000 [IU] | INTRAMUSCULAR | Status: DC | PRN

## 2012-04-09 MED ORDER — SODIUM CHLORIDE 0.9 % IV SOLN: 250.0000 mL | INTRAVENOUS | Status: DC | PRN

## 2012-04-09 MED ORDER — HEPARIN SODIUM (PORCINE) 1000 UNIT/ML DIALYSIS: 20.0000 [IU]/kg | INTRAMUSCULAR | Status: DC | PRN

## 2012-04-09 MED ORDER — LIDOCAINE HCL (PF) 1 % IJ SOLN: 5.0000 mL | INTRAMUSCULAR | Status: DC | PRN

## 2012-04-09 MED ORDER — SODIUM CHLORIDE 0.9 % IJ SOLN
3.0000 mL | Freq: Two times a day (BID) | INTRAMUSCULAR | Status: DC
  Administered 2012-04-09: 3 mL via INTRAVENOUS

## 2012-04-09 MED ORDER — HYDRALAZINE HCL 20 MG/ML IJ SOLN
5.0000 mg | Freq: Four times a day (QID) | INTRAMUSCULAR | Status: DC | PRN
  Filled 2012-04-09: qty 0.25

## 2012-04-09 MED ORDER — PENTAFLUOROPROP-TETRAFLUOROETH EX AERO
INHALATION_SPRAY | CUTANEOUS | Status: AC
  Filled 2012-04-09: qty 103.5

## 2012-04-09 MED ORDER — GUAIFENESIN ER 600 MG PO TB12: 1200.0000 mg | ORAL_TABLET | Freq: Two times a day (BID) | ORAL | Status: DC

## 2012-04-09 MED ORDER — ASPIRIN 81 MG PO TBEC: 81.0000 mg | DELAYED_RELEASE_TABLET | Freq: Every day | ORAL | Status: DC

## 2012-04-09 MED ORDER — ASPIRIN EC 81 MG PO TBEC
81.0000 mg | DELAYED_RELEASE_TABLET | Freq: Every day | ORAL | Status: DC
  Administered 2012-04-09: 81 mg via ORAL
  Filled 2012-04-09: qty 1

## 2012-04-09 MED ORDER — HYDRALAZINE HCL 20 MG/ML IJ SOLN
5.0000 mg | Freq: Once | INTRAMUSCULAR | Status: AC
  Administered 2012-04-09: 5 mg via INTRAVENOUS
  Filled 2012-04-09 (×2): qty 0.25

## 2012-04-09 MED ORDER — GLYBURIDE 5 MG PO TABS
5.0000 mg | ORAL_TABLET | Freq: Every day | ORAL | Status: DC
  Administered 2012-04-09: 5 mg via ORAL
  Filled 2012-04-09 (×2): qty 1

## 2012-04-09 MED ORDER — HYDROCODONE-ACETAMINOPHEN 5-325 MG PO TABS: 1.0000 | ORAL_TABLET | ORAL | Status: DC | PRN

## 2012-04-09 MED ORDER — SODIUM CHLORIDE 0.9 % IJ SOLN: 3.0000 mL | Freq: Two times a day (BID) | INTRAMUSCULAR | Status: DC

## 2012-04-09 MED ORDER — ALTEPLASE 2 MG IJ SOLR
2.0000 mg | Freq: Once | INTRAMUSCULAR | Status: DC | PRN
  Filled 2012-04-09: qty 2

## 2012-04-09 MED ORDER — PENTAFLUOROPROP-TETRAFLUOROETH EX AERO: 1.0000 "application " | INHALATION_SPRAY | CUTANEOUS | Status: DC | PRN

## 2012-04-09 MED ORDER — NEPRO/CARBSTEADY PO LIQD
237.0000 mL | ORAL | Status: DC | PRN
  Filled 2012-04-09: qty 237

## 2012-04-09 MED ORDER — SODIUM CHLORIDE 0.9 % IJ SOLN
3.0000 mL | INTRAMUSCULAR | Status: DC | PRN
  Administered 2012-04-09: 3 mL via INTRAVENOUS

## 2012-04-09 MED ORDER — SORBITOL 70 % PO SOLN
15.0000 mL | Freq: Two times a day (BID) | ORAL | Status: DC | PRN
  Filled 2012-04-09: qty 15

## 2012-04-09 MED ORDER — ONDANSETRON HCL 4 MG/2ML IJ SOLN: 4.0000 mg | Freq: Four times a day (QID) | INTRAMUSCULAR | Status: DC | PRN

## 2012-04-09 MED ORDER — LABETALOL HCL 300 MG PO TABS
300.0000 mg | ORAL_TABLET | Freq: Three times a day (TID) | ORAL | Status: DC
  Administered 2012-04-09: 300 mg via ORAL
  Filled 2012-04-09 (×3): qty 1

## 2012-04-09 MED ORDER — LIDOCAINE-PRILOCAINE 2.5-2.5 % EX CREA
1.0000 "application " | TOPICAL_CREAM | CUTANEOUS | Status: DC | PRN
  Filled 2012-04-09: qty 5

## 2012-04-09 MED ORDER — LABETALOL HCL 300 MG PO TABS: 300.0000 mg | ORAL_TABLET | Freq: Three times a day (TID) | ORAL | Status: DC

## 2012-04-09 MED ORDER — TRAMADOL-ACETAMINOPHEN 37.5-325 MG PO TABS: 1.0000 | ORAL_TABLET | Freq: Four times a day (QID) | ORAL | Status: DC | PRN

## 2012-04-09 NOTE — Progress Notes (Signed)
Patient blood pressure elevated 218/62 HR 60 denies pain.Call placed to Vernona Rieger NP. New orders received will continue to monitor patient

## 2012-04-09 NOTE — Progress Notes (Signed)
  Echocardiogram 2D Echocardiogram has been performed.  Cathie Beams 04/09/2012, 10:43 AM

## 2012-04-09 NOTE — Progress Notes (Signed)
Patient blood pressure remains elevated 206/63 HR 58 after giving 5mg  IV hydralizine as ordered. Call placed to [facility] NP. New orders received.

## 2012-04-09 NOTE — Discharge Summary (Signed)
Physician Discharge Summary  Jenicka Coxe MRN: [id number] DOB/AGE: 1940/11/26 72 y.o.  PCP: Quentin Mulling, MD   Admit date: 04/08/2012 Discharge date: 04/09/2012  Discharge Diagnoses:  Musculoskeletal chest pain Hypertensive urgency   Abnormal EKG Active Problems:   ESRD (end stage renal disease) on dialysis   Diabetes mellitus   Hypertension   Hypercalcemia associated with chronic dialysis     Medication List    TAKE these medications       aspirin 81 MG EC tablet  Take 1 tablet (81 mg total) by mouth daily.     glyBURIDE 5 MG tablet  Commonly known as:  DIABETA  Take 5 mg by mouth daily with breakfast.     HYDROcodone-acetaminophen 5-325 MG per tablet  Commonly known as:  NORCO/VICODIN  Take 1-2 tablets by mouth every 4 (four) hours as needed.     labetalol 300 MG tablet  Commonly known as:  NORMODYNE  Take 1 tablet (300 mg total) by mouth 3 (three) times daily.     sorbitol 70 % solution  Take 15 mLs by mouth 2 (two) times daily as needed. For bowel movement        Discharge Condition: Stable Disposition: 01-Home or Self Care   Consults:  Nephrology  Significant Diagnostic Studies: Dg Chest 2 View  04/08/2012  *RADIOLOGY REPORT*  Clinical Data: Chest pain.  CHEST - 2 VIEW  Comparison: PA and lateral chest 05/05/2011.  Findings: There is marked cardiomegaly without pulmonary edema. The patient has a trace left pleural effusion versus pleural scar. Aeration in the left lung base is markedly improved since the prior study.  The right lung is clear.  No pneumothorax.  IMPRESSION:  1.  Marked cardiomegaly without pulmonary edema. 2.  Small left pleural effusion versus scar.   Original Report Authenticated By: Holley Dexter, M.D.    Ct Chest Wo Contrast  04/08/2012  *RADIOLOGY REPORT*  Clinical Data: Left back pain.  History of pleural effusion.  CT CHEST WITHOUT CONTRAST  Technique:  Multidetector CT imaging of the chest was performed following the  standard protocol without IV contrast.  Comparison: Plain films 04/08/2012.  Chest CT 07/11/2005  Findings: Small to moderate left pleural effusion.  There is cardiomegaly.  Extensive coronary artery calcifications.  Aorta is normal caliber with moderate atherosclerotic change. No mediastinal, hilar, or axillary adenopathy. Scattered small mediastinal lymph nodes, none pathologically enlarged.  Mild vascular congestion.  Compressive atelectasis in the left lower lobe.  No overt edema.  Imaging into the upper abdomen demonstrates mild perihepatic and perisplenic ascites. No acute bony abnormality.  Mild degenerative changes in the thoracic spine.  IMPRESSION: Small to moderate left pleural effusion.  Minimal left base atelectasis.  Cardiomegaly, vascular congestion.  Upper abdominal ascites.   Original Report Authenticated By: Charlett Nose, M.D.    Nm Pulmonary Perf And Vent  04/08/2012  *RADIOLOGY REPORT*  Clinical Data:  72 year old female with chest and left back pain.  NUCLEAR MEDICINE VENTILATION - PERFUSION LUNG SCAN  Technique:  Ventilation images were obtained in multiple projections using inhaled aerosol technetium 99 M DTPA.  Perfusion images were obtained in multiple projections after intravenous injection of Tc-29m MAA.  Radiopharmaceuticals:  Tc-28m DTPA aerosol and 6.0 mCi Tc-48m MAA.  Comparison: 04/08/2012 chest radiograph  Findings:  Ventilation:   No focal ventilation defect.  Perfusion:   No wedge shaped peripheral perfusion defects to suggest acute pulmonary embolism  IMPRESSION: No evidence of pulmonary emboli.   Original Report  Authenticated By: Harmon Pier, M.D.    US Aorta  04/08/2012  *RADIOLOGY REPORT*  Clinical Data:  Abdominal and back pain.  ULTRASOUND OF ABDOMINAL AORTA  Technique:  Ultrasound examination of the abdominal aorta was performed to evaluate for abdominal aortic aneurysm.  Comparison: Ultrasound 10/30/2011.  CT 04/06/2008  Abdominal Aorta:  Mild atherosclerotic  irregularity in the aorta. No aneurysm identified.        Maximum AP diameter:  2.4 cm.       Maximum TRV diameter:  2.4 cm.  Mild to moderate ascites visualized in all quadrants of the abdomen/pelvis.  IMPRESSION: No evidence of abdominal aortic aneurysm.  Incidentally noted is mild to moderate ascites.   Original Report Authenticated By: Charlett Nose, M.D.      2-D echo pending   Microbiology: Recent Results (from the past 240 hour(s))  MRSA PCR SCREENING     Status: None   Collection Time    04/09/12 12:13 AM      Result Value Range Status   MRSA by PCR NEGATIVE  NEGATIVE Final   Comment:            The GeneXpert MRSA Assay (FDA     approved for NASAL specimens     only), is one component of a     comprehensive MRSA colonization     surveillance program. It is not     intended to diagnose MRSA     infection nor to guide or     monitor treatment for     MRSA infections.     Labs: Results for orders placed during the hospital encounter of 04/08/12 (from the past 48 hour(s))  CBC WITH DIFFERENTIAL     Status: Abnormal   Collection Time    04/08/12  2:16 PM      Result Value Range   WBC 4.2  4.0 - 10.5 K/uL   RBC 3.32 (*) 3.87 - 5.11 MIL/uL   Hemoglobin 10.9 (*) 12.0 - 15.0 g/dL   HCT 16.1 (*) 09.6 - 04.5 %   MCV 94.9  78.0 - 100.0 fL   MCH 32.8  26.0 - 34.0 pg   MCHC 34.6  30.0 - 36.0 g/dL   RDW 40.9  81.1 - 91.4 %   Platelets 151  150 - 400 K/uL   Neutrophils Relative 57  43 - 77 %   Neutro Abs 2.4  1.7 - 7.7 K/uL   Lymphocytes Relative 29  12 - 46 %   Lymphs Abs 1.2  0.7 - 4.0 K/uL   Monocytes Relative 7  3 - 12 %   Monocytes Absolute 0.3  0.1 - 1.0 K/uL   Eosinophils Relative 6 (*) 0 - 5 %   Eosinophils Absolute 0.2  0.0 - 0.7 K/uL   Basophils Relative 1  0 - 1 %   Basophils Absolute 0.1  0.0 - 0.1 K/uL  COMPREHENSIVE METABOLIC PANEL     Status: Abnormal   Collection Time    04/08/12  2:16 PM      Result Value Range   Sodium 137  135 - 145 mEq/L   Potassium  3.6  3.5 - 5.1 mEq/L   Chloride 97  96 - 112 mEq/L   CO2 28  19 - 32 mEq/L   Glucose, Bld 192 (*) 70 - 99 mg/dL   BUN 30 (*) 6 - 23 mg/dL   Creatinine, Ser 7.82 (*) 0.50 - 1.10 mg/dL   Calcium 95.6 (*) 8.4 - 10.5  mg/dL   Total Protein 6.7  6.0 - 8.3 g/dL   Albumin 3.4 (*) 3.5 - 5.2 g/dL   AST 20  0 - 37 U/L   ALT 10  0 - 35 U/L   Alkaline Phosphatase 149 (*) 39 - 117 U/L   Total Bilirubin 0.4  0.3 - 1.2 mg/dL   GFR calc non Af Amer 8 (*) >90 mL/min   GFR calc Af Amer 10 (*) >90 mL/min   Comment:            The eGFR has been calculated     using the CKD EPI equation.     This calculation has not been     validated in all clinical     situations.     eGFR's persistently     <90 mL/min signify     possible Chronic Kidney Disease.  POCT I-STAT TROPONIN I     Status: None   Collection Time    04/08/12  2:45 PM      Result Value Range   Troponin i, poc 0.04  0.00 - 0.08 ng/mL   Comment 3            Comment: Due to the release kinetics of cTnI,     a negative result within the first hours     of the onset of symptoms does not rule out     myocardial infarction with certainty.     If myocardial infarction is still suspected,     repeat the test at appropriate intervals.  GLUCOSE, CAPILLARY     Status: Abnormal   Collection Time    04/08/12 11:52 PM      Result Value Range   Glucose-Capillary 223 (*) 70 - 99 mg/dL  MRSA PCR SCREENING     Status: None   Collection Time    04/09/12 12:13 AM      Result Value Range   MRSA by PCR NEGATIVE  NEGATIVE   Comment:            The GeneXpert MRSA Assay (FDA     approved for NASAL specimens     only), is one component of a     comprehensive MRSA colonization     surveillance program. It is not     intended to diagnose MRSA     infection nor to guide or     monitor treatment for     MRSA infections.  TROPONIN I     Status: None   Collection Time    04/09/12 12:26 AM      Result Value Range   Troponin I <0.30  <0.30 ng/mL    Comment:            Due to the release kinetics of cTnI,     a negative result within the first hours     of the onset of symptoms does not rule out     myocardial infarction with certainty.     If myocardial infarction is still suspected,     repeat the test at appropriate intervals.  TROPONIN I     Status: None   Collection Time    04/09/12  6:20 AM      Result Value Range   Troponin I <0.30  <0.30 ng/mL   Comment:            Due to the release kinetics of cTnI,     a negative result within the first hours  of the onset of symptoms does not rule out     myocardial infarction with certainty.     If myocardial infarction is still suspected,     repeat the test at appropriate intervals.     HPI : 72 year old female with end-stage renal disease, non-Hodgkin's lymphoma, hypertension, diabetes who presents to the ED with left-sided thoracic pain and back pain for the past one week Family is present and patient is mainly spanish speaking, communication is aided by several english speaking family members who are present. Pt has had an extensive w/u in the ED for her back pain today including aorta u/s, ct chest showed small to moderate left-sided pleural effusion but no pneumonia, and vq scan. She also had a 12 lead ekg which shows some st seg depression in lateral leads which is different than previous ekg a year ago. She also has mild hypercalcemia which is asymptomatic and chronic. VQ scan was done that was negative for pulmonary embolism   Patient has had a cough with productive mucus. Symptoms could be secondary to pleurisy viral bronchitis We'll treat symptomatically the patient will be discharged post hemodialysis    Discharge Exam:  Blood pressure 194/55, pulse 56, temperature 98.7 F (37.1 C), temperature source Oral, resp. rate 18, weight 54.9 kg (121 lb 0.5 oz), SpO2 99.00%.   General appearance: alert, cooperative and no distress  Head: Normocephalic, without obvious  abnormality, atraumatic  Eyes: negative  Nose: Nares normal. Septum midline. Mucosa normal. No drainage or sinus tenderness.  Neck: no JVD and supple, symmetrical, trachea midline  Back: symmetric, no curvature. ROM normal. No CVA tenderness.  Lungs: clear to auscultation bilaterally  Heart: regular rate and rhythm, S1, S2 normal, no click, rub or gallop 3/6 SEM  Abdomen: soft, non-tender; bowel sounds normal; no masses, no organomegaly  Extremities: extremities normal, atraumatic, no cyanosis or edema  Pulses: 2+ and symmetric  Skin: Skin color, texture, turgor normal. No rashes or lesions  Neurologic: Grossly normal           Future Appointments Provider Department Dept Phone   05/19/2012 1:00 PM Delcie Roch [place] [facility] [phone]   05/19/2012 1:30 PM Lowella Dell, MD [facility] [phone]      Follow-up Information   Schedule an appointment as soon as possible for a visit with HOOPER,JEFFREY C, MD.   Contact information:   [address] [place] [address]       Signed: Richarda Overlie 04/09/2012, 9:12 AM

## 2012-04-09 NOTE — Progress Notes (Signed)
[id number] Patient had a two second pause returning to sinus rhythm first degree heart block. Denies pain or shortness of breath blood pressure 181/57.Call placed to Vernona Rieger NP.No new orders received.Will continue to monitor patient.

## 2012-04-09 NOTE — Progress Notes (Signed)
Interpreter Wyvonnia Dusky for [employer]

## 2012-04-09 NOTE — Consult Note (Signed)
[facility] Renal Consultation Note  Indication for Consultation:  Management of ESRD/hemodialysis; anemia, hypertension/volume and secondary hyperparathyroidism  HPI: [name] is a 72 y.o. Spanish-speaking female with ESRD on dialysis on TTS at [facility] who presented yesterday with left-side back and chest pain for about one week.  In the ED her EKG indicated some ST-segment depression in the lateral leads which is different form previous EKG.  Aortic ultrasound showed no evidence of abdominal aortic aneurysm, VQ scan showed no evidence of pulmonary embolism, and chest x-ray showed only small to moderate left pleural effusion with upper abdominal ascites.  She does not recall any acute injury and denies any shortness of breath, nausea, vomiting, fever, or chills. Cardiology consult is pending, and she will have dialysis today.  Dialysis Orders: Center: [facility] on TTS. EDW 55.5 kg   HD Bath 2K/2.25Ca  Time 3 hrs   Heparin 1800 U.  Access AVF @ LUA   BFR 400 DFR 800   Hectorol 4 mcg IV/HD   Epogen 2000 Units IV/HD  Venofer 0   Other profile 4.  Past Medical History  Diagnosis Date  . Cancer   . ESRD on dialysis     dialysis  . Hypertension   . Diabetes mellitus   . Bladder cancer Jan 10, 2006    BLADDER NECK, BIOPSY: DIFFUSE LARGE B-CELL LYMPHOMA. CT showed worrisome pelvic LN. No treatment records in EPIC.   Past Surgical History  Procedure Laterality Date  . Bladder cancer    . Renal failure     Family History  Problem Relation Age of Onset  . Thyroid disease Mother    Social History She denies any history of tobacco, alcohol, or illicit drug use.  She moved from [place] to [place] seven years ago and lives with her husband.  She speaks no [place].  No Known Allergies Prior to Admission medications   Medication Sig Start Date End Date Taking? Authorizing Provider  glyBURIDE (DIABETA) 5 MG tablet Take 5 mg by mouth daily with breakfast.   Yes Historical  Provider, MD  sorbitol 70 % solution Take 15 mLs by mouth 2 (two) times daily as needed. For bowel movement 05/06/11  Yes Belia Heman, MD  aspirin EC 81 MG EC tablet Take 1 tablet (81 mg total) by mouth daily. 04/09/12   Richarda Overlie, MD  guaiFENesin (MUCINEX) 600 MG 12 hr tablet Take 2 tablets (1,200 mg total) by mouth 2 (two) times daily. 04/09/12   Richarda Overlie, MD  HYDROcodone-acetaminophen (NORCO/VICODIN) 5-325 MG per tablet Take 1-2 tablets by mouth every 4 (four) hours as needed. 04/09/12   Richarda Overlie, MD  labetalol (NORMODYNE) 300 MG tablet Take 1 tablet (300 mg total) by mouth 3 (three) times daily. 04/09/12   Richarda Overlie, MD   Labs:  Results for orders placed during the hospital encounter of 04/08/12 (from the past 48 hour(s))  CBC WITH DIFFERENTIAL     Status: Abnormal   Collection Time    04/08/12  2:16 PM      Result Value Range   WBC 4.2  4.0 - 10.5 K/uL   RBC 3.32 (*) 3.87 - 5.11 MIL/uL   Hemoglobin 10.9 (*) 12.0 - 15.0 g/dL   HCT 45.4 (*) 09.8 - 11.9 %   MCV 94.9  78.0 - 100.0 fL   MCH 32.8  26.0 - 34.0 pg   MCHC 34.6  30.0 - 36.0 g/dL   RDW 14.7  82.9 - 56.2 %  Platelets 151  150 - 400 K/uL   Neutrophils Relative 57  43 - 77 %   Neutro Abs 2.4  1.7 - 7.7 K/uL   Lymphocytes Relative 29  12 - 46 %   Lymphs Abs 1.2  0.7 - 4.0 K/uL   Monocytes Relative 7  3 - 12 %   Monocytes Absolute 0.3  0.1 - 1.0 K/uL   Eosinophils Relative 6 (*) 0 - 5 %   Eosinophils Absolute 0.2  0.0 - 0.7 K/uL   Basophils Relative 1  0 - 1 %   Basophils Absolute 0.1  0.0 - 0.1 K/uL  COMPREHENSIVE METABOLIC PANEL     Status: Abnormal   Collection Time    04/08/12  2:16 PM      Result Value Range   Sodium 137  135 - 145 mEq/L   Potassium 3.6  3.5 - 5.1 mEq/L   Chloride 97  96 - 112 mEq/L   CO2 28  19 - 32 mEq/L   Glucose, Bld 192 (*) 70 - 99 mg/dL   BUN 30 (*) 6 - 23 mg/dL   Creatinine, Ser 2.13 (*) 0.50 - 1.10 mg/dL   Calcium 08.6 (*) 8.4 - 10.5 mg/dL   Total Protein 6.7  6.0 - 8.3 g/dL    Albumin 3.4 (*) 3.5 - 5.2 g/dL   AST 20  0 - 37 U/L   ALT 10  0 - 35 U/L   Alkaline Phosphatase 149 (*) 39 - 117 U/L   Total Bilirubin 0.4  0.3 - 1.2 mg/dL   GFR calc non Af Amer 8 (*) >90 mL/min   GFR calc Af Amer 10 (*) >90 mL/min   Comment:            The eGFR has been calculated     using the CKD EPI equation.     This calculation has not been     validated in all clinical     situations.     eGFR's persistently     <90 mL/min signify     possible Chronic Kidney Disease.  POCT I-STAT TROPONIN I     Status: None   Collection Time    04/08/12  2:45 PM      Result Value Range   Troponin i, poc 0.04  0.00 - 0.08 ng/mL   Comment 3            Comment: Due to the release kinetics of cTnI,     a negative result within the first hours     of the onset of symptoms does not rule out     myocardial infarction with certainty.     If myocardial infarction is still suspected,     repeat the test at appropriate intervals.  GLUCOSE, CAPILLARY     Status: Abnormal   Collection Time    04/08/12 11:52 PM      Result Value Range   Glucose-Capillary 223 (*) 70 - 99 mg/dL  MRSA PCR SCREENING     Status: None   Collection Time    04/09/12 12:13 AM      Result Value Range   MRSA by PCR NEGATIVE  NEGATIVE   Comment:            The GeneXpert MRSA Assay (FDA     approved for NASAL specimens     only), is one component of a     comprehensive MRSA colonization     surveillance program. It is  not     intended to diagnose MRSA     infection nor to guide or     monitor treatment for     MRSA infections.  TROPONIN I     Status: None   Collection Time    04/09/12 12:26 AM      Result Value Range   Troponin I <0.30  <0.30 ng/mL   Comment:            Due to the release kinetics of cTnI,     a negative result within the first hours     of the onset of symptoms does not rule out     myocardial infarction with certainty.     If myocardial infarction is still suspected,     repeat the test  at appropriate intervals.  TROPONIN I     Status: None   Collection Time    04/09/12  6:20 AM      Result Value Range   Troponin I <0.30  <0.30 ng/mL   Comment:            Due to the release kinetics of cTnI,     a negative result within the first hours     of the onset of symptoms does not rule out     myocardial infarction with certainty.     If myocardial infarction is still suspected,     repeat the test at appropriate intervals.   Constitutional: negative for chills, fatigue, fevers and sweats Ears, nose, mouth, throat, and face: negative for earaches, hearing loss, hoarseness, nasal congestion and sore throat Respiratory: negative for cough, dyspnea on exertion, hemoptysis and sputum Cardiovascular: negative for chest pain, chest pressure/discomfort, dyspnea, orthopnea and palpitations Gastrointestinal: negative for abdominal pain, change in bowel habits, nausea, vomiting and last bowel movement two days ago Genitourinary:negative, anuric Musculoskeletal:negative for arthralgias, back pain, myalgias and neck pain Neurological: negative for dizziness, gait problems, headaches, paresthesia and weakness  Physical Exam: Filed Vitals:   04/09/12 0846  BP: 194/55  Pulse: 56  Temp: 98.7 F (37.1 C)  Resp: 18     General appearance: alert, cooperative and no distress Head: Normocephalic, without obvious abnormality, atraumatic Neck: no adenopathy, no carotid bruit, no JVD and supple, symmetrical, trachea midline Resp: clear to auscultation bilaterally, fine crackles R base Cardio: RRR with Gr III/VI systolic murmur, no rub GI: soft, non-tender; bowel sounds normal; no masses,  no organomegaly Extremities: extremities normal, atraumatic, no cyanosis, but trace pretibial edema Musculoskeletal:  Left chest wall nontender to palpation, full range of motion of left arm Neurologic: Grossly normal Dialysis Access: AVF @ LUA with + bruit   Assessment/Plan: 1. Back pain - EKG with  new ST-segment depression in lateral leads; aortic US, VQ scan, & CXR unremarkable; pt currently asymptomatic.  Cardiology pending. Troponins negative also 2. ESRD -  HD on TTS @ [facility]; K 3.6.  HD pending today. 3. Hypertension/volume  - BP 194/55 most recently, on Labetalol 300 mg bid; wt 54.9 kg yesterday, below EDW of 55.5. 4. Anemia  - Hgb 10.9 on outpatient Epogen 2000 U.  Aranesp 25 mcg today. 5. Metabolic bone disease - Ca 10.6 (11.1 corrected); Hectorol 4 mcg.  Use 2Ca bath, hold Hectorol today. 6. Nutrition - Alb 3.4, renal diet. 7. DM - on Glyburide, SSI.  LYLES,CHARLES 04/09/2012, 11:22 AM   Attending Nephrologist: Delano Metz, MD  Patient seen and examined.  I agree with plan as above with additions as indicated. Rob  Shawndrea Rutkowski  MD [phone] pgr    [phone] cell 04/09/2012, 4:15 PM

## 2012-04-09 NOTE — Progress Notes (Signed)
Unable to complete health history at present time secondary to language barrier .Patient and family speak limited english.Patient keeps closing eyes trying to rest.Will report off to day nurse to complete.

## 2012-05-19 ENCOUNTER — Ambulatory Visit: Payer: Medicare Other | Admitting: Oncology

## 2012-05-19 ENCOUNTER — Other Ambulatory Visit: Payer: Medicare Other | Admitting: Lab

## 2013-03-08 ENCOUNTER — Other Ambulatory Visit: Payer: Self-pay | Admitting: *Deleted

## 2013-03-08 ENCOUNTER — Telehealth: Payer: Self-pay | Admitting: *Deleted

## 2013-03-08 NOTE — Telephone Encounter (Signed)
Use the interpeter to sw pt son gv appt for 03/12/13 w/ labs @ 10:30am and ov@ 11am. Pt is aware...td

## 2013-03-08 NOTE — Telephone Encounter (Signed)
[name] stopped by office today, patient is having problems and needs to return to Oncology. Dr Jana Hakim has no appts soon, will schedule for 1hour with Dr Earnest Conroy when he is in office. [name] will give patient these appts, but scheduling needs to call services to book [name] for appt.

## 2013-03-12 ENCOUNTER — Other Ambulatory Visit (HOSPITAL_BASED_OUTPATIENT_CLINIC_OR_DEPARTMENT_OTHER): Payer: Medicare Other

## 2013-03-12 ENCOUNTER — Ambulatory Visit (HOSPITAL_BASED_OUTPATIENT_CLINIC_OR_DEPARTMENT_OTHER): Payer: Medicare Other | Admitting: Hematology and Oncology

## 2013-03-12 ENCOUNTER — Telehealth: Payer: Self-pay | Admitting: Oncology

## 2013-03-12 VITALS — BP 195/71 | HR 69 | Temp 97.7°F | Resp 18 | Wt 121.4 lb

## 2013-03-12 DIAGNOSIS — R590 Localized enlarged lymph nodes: Secondary | ICD-10-CM

## 2013-03-12 DIAGNOSIS — R599 Enlarged lymph nodes, unspecified: Secondary | ICD-10-CM

## 2013-03-12 DIAGNOSIS — R109 Unspecified abdominal pain: Secondary | ICD-10-CM

## 2013-03-12 DIAGNOSIS — C8589 Other specified types of non-Hodgkin lymphoma, extranodal and solid organ sites: Secondary | ICD-10-CM

## 2013-03-12 DIAGNOSIS — R609 Edema, unspecified: Secondary | ICD-10-CM

## 2013-03-12 DIAGNOSIS — K5900 Constipation, unspecified: Secondary | ICD-10-CM

## 2013-03-12 DIAGNOSIS — Z992 Dependence on renal dialysis: Secondary | ICD-10-CM

## 2013-03-12 DIAGNOSIS — I10 Essential (primary) hypertension: Secondary | ICD-10-CM

## 2013-03-12 DIAGNOSIS — C8590 Non-Hodgkin lymphoma, unspecified, unspecified site: Secondary | ICD-10-CM

## 2013-03-12 LAB — CBC WITH DIFFERENTIAL/PLATELET
BASO%: 1.4 % (ref 0.0–2.0)
Basophils Absolute: 0.1 10*3/uL (ref 0.0–0.1)
EOS%: 4.7 % (ref 0.0–7.0)
Eosinophils Absolute: 0.2 10*3/uL (ref 0.0–0.5)
HEMATOCRIT: 33 % — AB (ref 34.8–46.6)
HGB: 11.1 g/dL — ABNORMAL LOW (ref 11.6–15.9)
LYMPH%: 30 % (ref 14.0–49.7)
MCH: 33.9 pg (ref 25.1–34.0)
MCHC: 33.7 g/dL (ref 31.5–36.0)
MCV: 100.7 fL (ref 79.5–101.0)
MONO#: 0.4 10*3/uL (ref 0.1–0.9)
MONO%: 8.5 % (ref 0.0–14.0)
NEUT#: 2.7 10*3/uL (ref 1.5–6.5)
NEUT%: 55.4 % (ref 38.4–76.8)
PLATELETS: 198 10*3/uL (ref 145–400)
RBC: 3.27 10*6/uL — AB (ref 3.70–5.45)
RDW: 13.2 % (ref 11.2–14.5)
WBC: 4.8 10*3/uL (ref 3.9–10.3)
lymph#: 1.4 10*3/uL (ref 0.9–3.3)

## 2013-03-12 LAB — COMPREHENSIVE METABOLIC PANEL (CC13)
ALK PHOS: 290 U/L — AB (ref 40–150)
ALT: 10 U/L (ref 0–55)
ANION GAP: 14 meq/L — AB (ref 3–11)
AST: 13 U/L (ref 5–34)
Albumin: 3.5 g/dL (ref 3.5–5.0)
BILIRUBIN TOTAL: 0.68 mg/dL (ref 0.20–1.20)
BUN: 23.4 mg/dL (ref 7.0–26.0)
CO2: 30 mEq/L — ABNORMAL HIGH (ref 22–29)
CREATININE: 4.2 mg/dL — AB (ref 0.6–1.1)
Calcium: 11.1 mg/dL — ABNORMAL HIGH (ref 8.4–10.4)
Chloride: 96 mEq/L — ABNORMAL LOW (ref 98–109)
Glucose: 140 mg/dl (ref 70–140)
Potassium: 4.4 mEq/L (ref 3.5–5.1)
SODIUM: 139 meq/L (ref 136–145)
Total Protein: 7.3 g/dL (ref 6.4–8.3)

## 2013-03-12 LAB — LACTATE DEHYDROGENASE (CC13): LDH: 151 U/L (ref 125–245)

## 2013-03-12 NOTE — Progress Notes (Signed)
ID: [name]   DOB: 1940-11-04  MR#: [id number]  LOV#:[id number]  PCP: Garnette Czech, MD GYN:  SU:  OTHER MD:  DIAGNOSIS: Non-Hodgkin's lymphoma  Chief complaint: Follow up visit for lymphoma  HISTORY OF PRESENT ILLNESS: The patient was originally referred in 2007, when she presented with gross hematuria.  Dr. Jeffie Pollock obtained CT urogram, which showed small kidneys bilaterally (the patient has end-stage renal disease) with no obvious stones or renal lesions.  In the bladder, there was a donut-shaped mass at the bladder neck and cystoscopy showed a smooth bladder without papillary lesions but on retroflexion there was a donut-like lesion relatively circumferential at the bladder neck with some bleeding on the posterior aspect.  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  The patient was referred for further evaluation but failed to show (I actually wrote her a letter in [place] with a 2nd appointment but she failed to show for that appointment as well.).   More recently, the patient again developed gross hematuria.  She again went to see Dr. Jeffie Pollock and he again obtained a CT of the abdomen and pelvis without contrast.  Interestingly, some of the abdominal lymph nodes previously noted are now a little bit smaller.  The left ureter, which was questionably or intermittently dilated previously, is now clearly dilated to the level of S3, at which point it is difficult to follow.  There is bladder wall thickening, particularly on the right, and there is an enlarged left groin lymph node measuring 2.5 cm.  Otherwise, the left groin adenopathy is decreased compared to 2007. Her subsequent history is as detailed below.  INTERVAL HISTORY: [name] returns today with her family and a Patent attorney, for followup of her non-Hodgkin's lymphoma. She complains of abdominal distention for the past 6 months. She c/o decrease in appetite and loss of  weight  for the past 6 months. Today her blood pressure is 195/ 63mmhg and  she did not take her BP medications since yesterday. She has zero urine output and no hematuria. She continues to receive hemodialysis on Tuesdays, Thursdays, and Saturdays.  She also complains of a lump in the left inguinal area and she complains of discomfort in that area  REVIEW OF SYSTEMS: She denies fevers, drenching sweats, or unexplained fatigue. She's had no nausea, emesis.  She has chronic constipation. No cough or increased shortness of breath, chest pain, palpitations, headaches, blurred vision.   A 14 point review of systems is been assessed and the pertinent findings are mentioned in interval history   PAST MEDICAL HISTORY: Past Medical History  Diagnosis Date  . Cancer   . ESRD on dialysis     dialysis  . Hypertension   . Diabetes mellitus   . Bladder cancer Jan 10, 2006    BLADDER NECK, BIOPSY: DIFFUSE LARGE B-CELL LYMPHOMA. CT showed worrisome pelvic LN. No treatment records in EPIC.  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  She has a fistula in the left upper extremity placed June of 2007.  Her diabetes, she says, is well controlled.  She is essentially blind secondary to that.  There is also a history of hypothyroidism.  PAST SURGICAL HISTORY: Past Surgical History  Procedure Laterality Date  . Bladder cancer    . Renal failure    .  Insertion of dialysis catheter      FAMILY HISTORY Family History  Problem Relation Age of Onset  . Thyroid disease Mother   The patient's father died at the age of 6 from "lung problems".  The patient's mother died at the age of 55 from pneumonia.  The patient has 3 sisters and 4 brothers.  She is not aware of a history of cancer in the  family.    GYNECOLOGIC HISTORY: She had 7 children.  She does not recall when she went through the change of life.  She did not take hormones.  SOCIAL HISTORY: She was born in [place] in [place].  She has lived in the [place] about 8 years.  Her husband, Luiz Ochoa, is retired from Architect and is present today, as are 2 children, Mudlogger (who works in a [employer]) and Educational psychologist (who works in [employer]).  The patient has more grandchildren than she can count, and innumerable great-grandchildren as well.     ADVANCED DIRECTIVES:  HEALTH MAINTENANCE: History  Substance Use Topics  . Smoking status: Never Smoker   . Smokeless tobacco: Never Used  . Alcohol Use: No     Colonoscopy:  PAP:  Bone density:  Lipid panel:  No Known Allergies  Current Outpatient Prescriptions  Medication Sig Dispense Refill  . calcium carbonate (TUMS - DOSED IN MG ELEMENTAL CALCIUM) 500 MG chewable tablet Chew 1 tablet by mouth 2 (two) times daily.      Marland Kitchen glipiZIDE (GLUCOTROL) 10 MG tablet Take 10 mg by mouth 2 (two) times daily before a meal.      . omeprazole (PRILOSEC) 20 MG capsule Take 20 mg by mouth daily.      . sorbitol 70 % solution Take 15 mLs by mouth 2 (two) times daily as needed. For bowel movement  473 mL  0  . aspirin EC 81 MG EC tablet Take 1 tablet (81 mg total) by mouth daily.  30 tablet  0  . labetalol (NORMODYNE) 300 MG tablet Take 200 mg by mouth 2 (two) times daily.      . traMADol-acetaminophen (ULTRACET) 37.5-325 MG per tablet Take 1 tablet by mouth every 6 (six) hours as needed for pain.  30 tablet  0   No current facility-administered medications for this visit.    OBJECTIVE: Elderly Hispanic woman in no acute distress Filed Vitals:   03/12/13 1000  BP: 195/71  Pulse: 69  Temp: 97.7 F (36.5 C)  Resp: 18     Body mass index is 23.71 kg/(m^2).    ECOG FS: 1 Filed Weights   03/12/13 1000  Weight: 121 lb 6.4 oz (55.067 kg)   HEENT PERRLA, Sclerae anicteric,  conjunctiva appear  Oropharynx clear Left axillary lymphadenopathy is noted , approximately 1 cm in size freely mobile . Lungs no rales or rhonchi Heart regular rate and rhythm, harsh systolic murmur Abdomen: slightly distended, positive bowel sounds, nontender to palpation. Patient has ? reducible hernia in the left inguinal area MSK no focal spinal tenderness, no peripheral edema Neuro: Alert oriented x3, no focal deficits  Breasts: No masses felt  Skin:  Erythematous rash present on the right upper chest wall with several vesicles present Extremities: No cyanosis no clubbing and no edema   LAB RESULTS: Lab Results  Component Value Date   WBC 4.8 03/12/2013   NEUTROABS 2.7 03/12/2013   HGB 11.1* 03/12/2013   HCT 33.0* 03/12/2013   MCV 100.7 03/12/2013   PLT 198 03/12/2013  Chemistry      Component Value Date/Time   NA 139 03/12/2013 1012   NA 137 04/08/2012 1416   K 4.4 03/12/2013 1012   K 3.6 04/08/2012 1416   CL 97 04/08/2012 1416   CL 97* 10/16/2011 1425   CO2 30* 03/12/2013 1012   CO2 28 04/08/2012 1416   BUN 23.4 03/12/2013 1012   BUN 30* 04/08/2012 1416   CREATININE 4.2* 03/12/2013 1012   CREATININE 4.83* 04/08/2012 1416      Component Value Date/Time   CALCIUM 11.1* 03/12/2013 1012   CALCIUM 10.6* 04/08/2012 1416   ALKPHOS 290* 03/12/2013 1012   ALKPHOS 149* 04/08/2012 1416   AST 13 03/12/2013 1012   AST 20 04/08/2012 1416   ALT 10 03/12/2013 1012   ALT 10 04/08/2012 1416   BILITOT 0.68 03/12/2013 1012   BILITOT 0.4 04/08/2012 1416     LDH: 162 10/16/2011   STUDIES:  US Pelvis Limited  10/30/2011  *RADIOLOGY REPORT*  Clinical Data: Pelvic pain.  History of lymphoma and bladder cancer.  US PELVIS LIMITED OR FOLLOW UP  Technique:  Transabdominal ultrasound examination of the pelvis was performed including evaluation of the uterus, ovaries, adnexal regions, and pelvic cul-de-sac.  Comparison: No recent exams.  Findings: Small amount of ascites which may be loculated.   Evaluation is otherwise limited. If further delineation is clinically desired, follow-up CT may be considered.  IMPRESSION: Small amount of ascites which may be loculated.  Evaluation is otherwise limited.   Original Report Authenticated By: Doug Sou, M.D.     ASSESSMENT: 73 y.o. Spanish speaker with a history of   (1) non-Hodgkin's lymphoma, diffuse large B-cell subtype, diagnosed through bladder neck biopsy in November of 2007, status post bladder radiation for hematuria control completed in April of 2010, but never treated otherwise.  (2) on hemodialysis on Tuesdays, Thursdays and Saturdays.  (3) left axillary lymphadenopathy  (4) left inguinal questionable reducible hernia  (5) hypertension noncompliant with medications  (6) hypercalcemia most likely secondary to increased intake of calcium tums   PLAN:   1. In view of patient's abdominal distention and discomfort will obtain the CT of the abdomen and pelvis without contrast  To rule out lymphoma recurrence. patient says that she doesn't want any invasive workup for her bladder  2. we'll also obtain the CT of the chest to further delineate left axillary lymphadenopathy  3. Bilateral screening mammogram has been ordered  4. follow up with LDH and beta-2 microglobulin  5. I have asked  the patient to stop taking calcium TUMS and to have repeat calcium/ ionized calcium level in dialysis Center in 1 week  6. I have discussed with the patient on importance of taking her blood pressure medications and instructed to take her blood pressure meds now and to follow up with primary care physician ASAP  7. refer to surgery for evaluation of possible left inguinal hernia    Ms. Bustillo understood the plan of care and she knows to call us with any changes or problems.  Total time spent: 53minutes   Wilmon Arms, M.D. Medical oncology     03/12/2013

## 2013-03-12 NOTE — Telephone Encounter (Signed)
, °

## 2013-03-15 LAB — BETA 2 MICROGLOBULIN, SERUM: Beta-2 Microglobulin: 35.81 mg/L — ABNORMAL HIGH (ref 1.01–1.73)

## 2013-03-19 ENCOUNTER — Ambulatory Visit (HOSPITAL_COMMUNITY)
Admission: RE | Admit: 2013-03-19 | Discharge: 2013-03-19 | Disposition: A | Discharge status: Home / Self Care | Payer: Medicare Other | Source: Ambulatory Visit | Attending: Hematology and Oncology | Admitting: Hematology and Oncology

## 2013-03-19 ENCOUNTER — Encounter (HOSPITAL_COMMUNITY): Payer: Self-pay

## 2013-03-19 ENCOUNTER — Other Ambulatory Visit: Payer: Self-pay

## 2013-03-19 ENCOUNTER — Telehealth: Payer: Self-pay

## 2013-03-19 ENCOUNTER — Telehealth: Payer: Self-pay | Admitting: Oncology

## 2013-03-19 DIAGNOSIS — C8589 Other specified types of non-Hodgkin lymphoma, extranodal and solid organ sites: Secondary | ICD-10-CM

## 2013-03-19 DIAGNOSIS — R188 Other ascites: Secondary | ICD-10-CM | POA: Insufficient documentation

## 2013-03-19 DIAGNOSIS — J90 Pleural effusion, not elsewhere classified: Secondary | ICD-10-CM | POA: Insufficient documentation

## 2013-03-19 DIAGNOSIS — Z87898 Personal history of other specified conditions: Secondary | ICD-10-CM | POA: Insufficient documentation

## 2013-03-19 DIAGNOSIS — I319 Disease of pericardium, unspecified: Secondary | ICD-10-CM | POA: Insufficient documentation

## 2013-03-19 DIAGNOSIS — I700 Atherosclerosis of aorta: Secondary | ICD-10-CM | POA: Insufficient documentation

## 2013-03-19 DIAGNOSIS — I517 Cardiomegaly: Secondary | ICD-10-CM | POA: Insufficient documentation

## 2013-03-19 DIAGNOSIS — R599 Enlarged lymph nodes, unspecified: Secondary | ICD-10-CM | POA: Insufficient documentation

## 2013-03-19 DIAGNOSIS — M948X9 Other specified disorders of cartilage, unspecified sites: Secondary | ICD-10-CM | POA: Insufficient documentation

## 2013-03-19 DIAGNOSIS — I2510 Atherosclerotic heart disease of native coronary artery without angina pectoris: Secondary | ICD-10-CM | POA: Insufficient documentation

## 2013-03-19 NOTE — Telephone Encounter (Signed)
, °

## 2013-03-19 NOTE — Telephone Encounter (Signed)
Spoke with patient's husband about coming in next week (03/22/13) to go over CT results per Dr Andria Frames.  Husband said Monday, Wednesday, or Friday after 11am is the best time for them due to dialysis and transportation.  POF filled out and sent to scheduling with a note to call patient with appointment.  Patient's husband is also aware that scheduling will call with appointment.

## 2013-03-24 ENCOUNTER — Telehealth: Payer: Self-pay | Admitting: Hematology and Oncology

## 2013-03-24 ENCOUNTER — Encounter (INDEPENDENT_AMBULATORY_CARE_PROVIDER_SITE_OTHER): Payer: Self-pay | Admitting: General Surgery

## 2013-03-24 ENCOUNTER — Ambulatory Visit (HOSPITAL_BASED_OUTPATIENT_CLINIC_OR_DEPARTMENT_OTHER): Payer: Medicare Other | Admitting: Hematology and Oncology

## 2013-03-24 ENCOUNTER — Telehealth: Payer: Self-pay

## 2013-03-24 ENCOUNTER — Ambulatory Visit: Payer: Medicare Other

## 2013-03-24 ENCOUNTER — Ambulatory Visit (INDEPENDENT_AMBULATORY_CARE_PROVIDER_SITE_OTHER): Payer: Medicare Other | Admitting: General Surgery

## 2013-03-24 VITALS — BP 136/60 | HR 78 | Temp 98.5°F | Resp 16 | Ht 60.0 in | Wt 123.6 lb

## 2013-03-24 VITALS — BP 177/76 | HR 73 | Temp 98.3°F | Resp 20 | Ht 60.0 in | Wt 123.2 lb

## 2013-03-24 DIAGNOSIS — Z8579 Personal history of other malignant neoplasms of lymphoid, hematopoietic and related tissues: Secondary | ICD-10-CM

## 2013-03-24 DIAGNOSIS — I10 Essential (primary) hypertension: Secondary | ICD-10-CM

## 2013-03-24 DIAGNOSIS — C8589 Other specified types of non-Hodgkin lymphoma, extranodal and solid organ sites: Secondary | ICD-10-CM

## 2013-03-24 DIAGNOSIS — R9431 Abnormal electrocardiogram [ECG] [EKG]: Secondary | ICD-10-CM

## 2013-03-24 DIAGNOSIS — R599 Enlarged lymph nodes, unspecified: Secondary | ICD-10-CM

## 2013-03-24 DIAGNOSIS — K7460 Unspecified cirrhosis of liver: Secondary | ICD-10-CM | POA: Insufficient documentation

## 2013-03-24 DIAGNOSIS — K469 Unspecified abdominal hernia without obstruction or gangrene: Secondary | ICD-10-CM

## 2013-03-24 DIAGNOSIS — K4090 Unilateral inguinal hernia, without obstruction or gangrene, not specified as recurrent: Secondary | ICD-10-CM | POA: Insufficient documentation

## 2013-03-24 DIAGNOSIS — C8590 Non-Hodgkin lymphoma, unspecified, unspecified site: Secondary | ICD-10-CM

## 2013-03-24 DIAGNOSIS — Z992 Dependence on renal dialysis: Secondary | ICD-10-CM

## 2013-03-24 LAB — CBC WITH DIFFERENTIAL/PLATELET
BASO%: 2.5 % — AB (ref 0.0–2.0)
Basophils Absolute: 0.1 10*3/uL (ref 0.0–0.1)
EOS%: 4.2 % (ref 0.0–7.0)
Eosinophils Absolute: 0.2 10*3/uL (ref 0.0–0.5)
HEMATOCRIT: 31.4 % — AB (ref 34.8–46.6)
HGB: 10.5 g/dL — ABNORMAL LOW (ref 11.6–15.9)
LYMPH%: 26.9 % (ref 14.0–49.7)
MCH: 33.6 pg (ref 25.1–34.0)
MCHC: 33.6 g/dL (ref 31.5–36.0)
MCV: 100 fL (ref 79.5–101.0)
MONO#: 0.5 10*3/uL (ref 0.1–0.9)
MONO%: 10.1 % (ref 0.0–14.0)
NEUT#: 2.7 10*3/uL (ref 1.5–6.5)
NEUT%: 56.3 % (ref 38.4–76.8)
PLATELETS: 193 10*3/uL (ref 145–400)
RBC: 3.14 10*6/uL — ABNORMAL LOW (ref 3.70–5.45)
RDW: 13.6 % (ref 11.2–14.5)
WBC: 4.7 10*3/uL (ref 3.9–10.3)
lymph#: 1.3 10*3/uL (ref 0.9–3.3)

## 2013-03-24 LAB — COMPREHENSIVE METABOLIC PANEL (CC13)
ALBUMIN: 3.5 g/dL (ref 3.5–5.0)
ALT: 10 U/L (ref 0–55)
ANION GAP: 13 meq/L — AB (ref 3–11)
AST: 16 U/L (ref 5–34)
Alkaline Phosphatase: 313 U/L — ABNORMAL HIGH (ref 40–150)
BILIRUBIN TOTAL: 0.55 mg/dL (ref 0.20–1.20)
BUN: 36.2 mg/dL — ABNORMAL HIGH (ref 7.0–26.0)
CHLORIDE: 95 meq/L — AB (ref 98–109)
CO2: 30 meq/L — AB (ref 22–29)
Calcium: 10.9 mg/dL — ABNORMAL HIGH (ref 8.4–10.4)
Creatinine: 4.9 mg/dL (ref 0.6–1.1)
Glucose: 238 mg/dl — ABNORMAL HIGH (ref 70–140)
POTASSIUM: 4.8 meq/L (ref 3.5–5.1)
SODIUM: 138 meq/L (ref 136–145)
TOTAL PROTEIN: 7.2 g/dL (ref 6.4–8.3)

## 2013-03-24 LAB — LACTATE DEHYDROGENASE (CC13): LDH: 168 U/L (ref 125–245)

## 2013-03-24 NOTE — Telephone Encounter (Signed)
Labs faxed to Dr Charisse Klinefelter and [facility] per Dr Earnest Conroy.

## 2013-03-24 NOTE — Progress Notes (Signed)
Patient ID: [name], female   DOB: 05-Oct-1940, 73 y.o.   MRN: [id number]  No chief complaint on file.   HPI [name] is a 73 y.o. female.  We are asked to see the patient in consultation by Dr. Huey Bienenstock to evaluate her for a left inguinal hernia. The patient is a 73 year old Hispanic female who presents today with complaints of feeling bloated. She does have some occasional discomfort in her left groin area. She states that she thinks she's had a hernia there for about 25 years. It started when she was lifting heavy crates a long time ago. She has had no nausea or vomiting. She did have a recent CT scan that shows cirrhosis of the liver with significant ascites. There is a hernia in the left groin that is full ascites fluid. She apparently also has a history of lymphoma but it is not clear whether she has been treated or not. Her appetite is good and her bowels are moving normally.  HPI  Past Medical History  Diagnosis Date  . Cancer   . ESRD on dialysis     dialysis  . Hypertension   . Diabetes mellitus   . Bladder cancer Jan 10, 2006    BLADDER NECK, BIOPSY: DIFFUSE LARGE B-CELL LYMPHOMA. CT showed worrisome pelvic LN. No treatment records in EPIC.  Marland Kitchen Hyperparathyroidism, secondary     Past Surgical History  Procedure Laterality Date  . Bladder cancer    . Renal failure    . Insertion of dialysis catheter      Family History  Problem Relation Age of Onset  . Thyroid disease Mother   . Lung cancer Father     Social History History  Substance Use Topics  . Smoking status: Never Smoker   . Smokeless tobacco: Never Used  . Alcohol Use: No    No Known Allergies  Current Outpatient Prescriptions  Medication Sig Dispense Refill  . glipiZIDE (GLUCOTROL) 10 MG tablet Take 10 mg by mouth 2 (two) times daily before a meal.      . labetalol (NORMODYNE) 300 MG tablet Take 200 mg by mouth 2 (two) times daily.      Marland Kitchen omeprazole (PRILOSEC) 20 MG capsule Take 20 mg by mouth  daily.      . sorbitol 70 % solution Take 15 mLs by mouth 2 (two) times daily as needed. For bowel movement  473 mL  0  . aspirin EC 81 MG EC tablet Take 1 tablet (81 mg total) by mouth daily.  30 tablet  0  . calcium carbonate (TUMS - DOSED IN MG ELEMENTAL CALCIUM) 500 MG chewable tablet Chew 1 tablet by mouth 2 (two) times daily.      . traMADol-acetaminophen (ULTRACET) 37.5-325 MG per tablet Take 1 tablet by mouth every 6 (six) hours as needed for pain.  30 tablet  0   No current facility-administered medications for this visit.    Review of Systems Review of Systems  Constitutional: Negative.   HENT: Negative.   Eyes: Negative.   Respiratory: Negative.   Cardiovascular: Negative.   Gastrointestinal: Positive for abdominal distention.  Endocrine: Negative.   Genitourinary: Negative.   Musculoskeletal: Negative.   Skin: Negative.   Allergic/Immunologic: Negative.   Neurological: Negative.   Hematological: Negative.   Psychiatric/Behavioral: Negative.     Blood pressure 136/60, pulse 78, temperature 98.5 F (36.9 C), resp. rate 16, height 5' (1.524 m), weight 123 lb 9.6 oz (56.065 kg).  Physical Exam Physical Exam  Constitutional: She is oriented to person, place, and time. She appears well-developed and well-nourished.  HENT:  Head: Normocephalic and atraumatic.  Eyes: Conjunctivae and EOM are normal. Pupils are equal, round, and reactive to light.  Neck: Normal range of motion. Neck supple.  Cardiovascular: Normal rate and regular rhythm.   Murmur heard. She has a very loud heart murmur heard best at the left parasternal location  Pulmonary/Chest: Effort normal and breath sounds normal.  Abdominal: Soft. Bowel sounds are normal.  Her abdomen is nontender. She is distended with ascites fluid. She has a reducible left inguinal hernia.  Musculoskeletal: Normal range of motion.  Lymphadenopathy:    She has no cervical adenopathy.  Neurological: She is alert and oriented  to person, place, and time.  Skin: Skin is warm and dry.  Psychiatric: She has a normal mood and affect. Her behavior is normal.    Data Reviewed As above  Assessment    The patient does have a reducible left inguinal hernia but more significantly has significant cirrhosis and ascites fluid as well as a very loud heart murmur.     Plan    At this point I think she would be at very high risk to have any kind of surgical repair of this hernia. I think the hernia may be less problematic for her if her ascites was under better control. I will plan to refer her to a gastroenterologist to evaluate her cirrhosis. She will also need to be evaluated by a cardiologist for the heart murmur. Once she is in a more stable situation medically then we may reconsider fixing her hernia. I will plan to see her back on a when necessary basis        TOTH III,Josua Ferrebee S 03/24/2013, 4:54 PM

## 2013-03-24 NOTE — Patient Instructions (Signed)
Will refer to cardiology to evaluate murmur Will refer to gastroenterology to evaluate cirrhosis

## 2013-03-24 NOTE — Progress Notes (Signed)
ID: Trinda Pascal   DOB: Sep 19, 1951  MR#: [id number]  DVV#:[id number]  PCP: Garnette Czech, MD GYN:  SU: Dr.Toth III OTHER MD:  DIAGNOSIS: Non-Hodgkin's lymphoma  Chief complaint: Follow up visit for lymphoma and  review of CAT scans  HISTORY OF PRESENT ILLNESS: As per previously documented note: The patient was originally referred in 2007, when she presented with gross hematuria.  Dr. Jeffie Pollock obtained CT urogram, which showed small kidneys bilaterally (the patient has end-stage renal disease) with no obvious stones or renal lesions.  In the bladder, there was a donut-shaped mass at the bladder neck and cystoscopy showed a smooth bladder without papillary lesions but on retroflexion there was a donut-like lesion relatively circumferential at the bladder neck with some bleeding on the posterior aspect.  This was biopsied ([id number]) and showed a large B cell lymphoma, CD20 positive, CD3 negative.  The patient was referred for further evaluation but failed to show (I actually wrote her a letter in [place] with a 2nd appointment but she failed to show for that appointment as well.).   More recently, the patient again developed gross hematuria.  She again went to see Dr. Jeffie Pollock and he again obtained a CT of the abdomen and pelvis without contrast.  Interestingly, some of the abdominal lymph nodes previously noted are now a little bit smaller.  The left ureter, which was questionably or intermittently dilated previously, is now clearly dilated to the level of S3, at which point it is difficult to follow.  There is bladder wall thickening, particularly on the right, and there is an enlarged left groin lymph node measuring 2.5 cm.  Otherwise, the left groin adenopathy is decreased compared to 2007. Her subsequent history is as detailed below.  INTERVAL HISTORY: Mrs. Norma Fredrickson returns today with her family(her husband and grandson) and a Patent attorney, for followup of her non-Hodgkin's lymphoma and to  go over  the CAT scan reports.  She c/o  abdominal distention/discomfort for the past 6 months. She c/o decrease in appetite and loss of weight  for the past 6 months. Today her blood pressure is 177/ 37mmhg and  she she says that she does not take her blood pressure medications regularly. She has zero urine output and no hematuria. She continues to receive hemodialysis on Tuesdays, Thursdays, and Saturdays.  She also complains of a lump in the left inguinal area and she says that it goes inside the abdomen and it comes out sometimes and she complains of discomfort in that area  REVIEW OF SYSTEMS: She denies fevers, drenching sweats, or unexplained fatigue. She's had no nausea, emesis.  She has chronic constipation. No cough or increased shortness of breath, chest pain, palpitations, headaches, blurred vision.   A 14 point review of systems is been assessed and the pertinent findings are mentioned in interval history   PAST MEDICAL HISTORY: Past Medical History  Diagnosis Date  . Cancer   . ESRD on dialysis     dialysis  . Hypertension   . Diabetes mellitus   . Bladder cancer Jan 10, 2006    BLADDER NECK, BIOPSY: DIFFUSE LARGE B-CELL LYMPHOMA. CT showed worrisome pelvic LN. No treatment records in EPIC.  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  She has a fistula in the left upper extremity placed June of 2007.  Her diabetes, she says, is  well controlled.  She is essentially blind secondary to that.  There is also a history of hypothyroidism.  PAST SURGICAL HISTORY: Past Surgical History  Procedure Laterality Date  . Bladder cancer    . Renal failure    . Insertion of dialysis catheter      FAMILY HISTORY Family History  Problem Relation Age of Onset  . Thyroid  disease Mother   The patient's father died at the age of 26 from "lung problems".  The patient's mother died at the age of 8 from pneumonia.  The patient has 3 sisters and 4 brothers.  She is not aware of a history of cancer in the family.    GYNECOLOGIC HISTORY: She had 7 children.  She does not recall when she went through the change of life.  She did not take hormones.  SOCIAL HISTORY: She was born in [place] in [place].  She has lived in the [place] about 8 years.  Her husband, Luiz Ochoa, is retired from Architect and is present today, as are 2 children, Mudlogger (who works in a [employer]) and Educational psychologist (who works in [employer]).  The patient has more grandchildren than she can count, and innumerable great-grandchildren as well.     ADVANCED DIRECTIVES:  HEALTH MAINTENANCE: History  Substance Use Topics  . Smoking status: Never Smoker   . Smokeless tobacco: Never Used  . Alcohol Use: No     Colonoscopy:  PAP:  Bone density:  Lipid panel:  No Known Allergies  Current Outpatient Prescriptions  Medication Sig Dispense Refill  . glipiZIDE (GLUCOTROL) 10 MG tablet Take 10 mg by mouth 2 (two) times daily before a meal.      . labetalol (NORMODYNE) 300 MG tablet Take 200 mg by mouth 2 (two) times daily.      Marland Kitchen omeprazole (PRILOSEC) 20 MG capsule Take 20 mg by mouth daily.      Marland Kitchen aspirin EC 81 MG EC tablet Take 1 tablet (81 mg total) by mouth daily.  30 tablet  0  . calcium carbonate (TUMS - DOSED IN MG ELEMENTAL CALCIUM) 500 MG chewable tablet Chew 1 tablet by mouth 2 (two) times daily.      . sorbitol 70 % solution Take 15 mLs by mouth 2 (two) times daily as needed. For bowel movement  473 mL  0  . traMADol-acetaminophen (ULTRACET) 37.5-325 MG per tablet Take 1 tablet by mouth every 6 (six) hours as needed for pain.  30 tablet  0   No current facility-administered medications for this visit.    OBJECTIVE: Elderly Hispanic woman 73-year-old in no acute distress Filed Vitals:    03/24/13 1147  BP: 177/76  Pulse: 73  Temp: 98.3 F (36.8 C)  Resp: 20     Body mass index is 24.06 kg/(m^2).    ECOG FS: 1 Filed Weights   03/24/13 1147  Weight: 123 lb 3.2 oz (55.883 kg)   HEENT PERRLA, Sclerae anicteric, conjunctiva appear  Oropharynx clear Left axillary lymphadenopathy is noted , approximately 1 cm in size freely mobile . Lungs no rales or rhonchi Heart regular rate and rhythm, harsh systolic murmur Abdomen: distended, positive bowel sounds, nontender to palpation. Patient has lymphadenopathy/? reducible hernia in the left inguinal area MSK no focal spinal tenderness, no peripheral edema Neuro: Alert oriented x3, no focal deficits  Breasts: No masses felt  Skin:  Erythematous rash present on the right upper chest wall with several vesicles present Extremities: No cyanosis no clubbing and no  edema   LAB RESULTS: Lab Results  Component Value Date   WBC 4.7 03/24/2013   NEUTROABS 2.7 03/24/2013   HGB 10.5* 03/24/2013   HCT 31.4* 03/24/2013   MCV 100.0 03/24/2013   PLT 193 03/24/2013      Chemistry      Component Value Date/Time   NA 139 03/12/2013 1012   NA 137 04/08/2012 1416   K 4.4 03/12/2013 1012   K 3.6 04/08/2012 1416   CL 97 04/08/2012 1416   CL 97* 10/16/2011 1425   CO2 30* 03/12/2013 1012   CO2 28 04/08/2012 1416   BUN 23.4 03/12/2013 1012   BUN 30* 04/08/2012 1416   CREATININE 4.2* 03/12/2013 1012   CREATININE 4.83* 04/08/2012 1416      Component Value Date/Time   CALCIUM 11.1* 03/12/2013 1012   CALCIUM 10.6* 04/08/2012 1416   ALKPHOS 290* 03/12/2013 1012   ALKPHOS 149* 04/08/2012 1416   AST 13 03/12/2013 1012   AST 20 04/08/2012 1416   ALT 10 03/12/2013 1012   ALT 10 04/08/2012 1416   BILITOT 0.68 03/12/2013 1012   BILITOT 0.4 04/08/2012 1416     LDH: 162 10/16/2011   STUDIES:  US Pelvis Limited  10/30/2011  *RADIOLOGY REPORT*  Clinical Data: Pelvic pain.  History of lymphoma and bladder cancer.  US PELVIS LIMITED OR FOLLOW UP  Technique:   Transabdominal ultrasound examination of the pelvis was performed including evaluation of the uterus, ovaries, adnexal regions, and pelvic cul-de-sac.  Comparison: No recent exams.  Findings: Small amount of ascites which may be loculated.  Evaluation is otherwise limited. If further delineation is clinically desired, follow-up CT may be considered.  IMPRESSION: Small amount of ascites which may be loculated.  Evaluation is otherwise limited.   Original Report Authenticated By: Doug Sou, M.D.     ASSESSMENT: 73 y.o. Spanish speaker with a history of   (1) non-Hodgkin's lymphoma, diffuse large B-cell subtype, diagnosed through bladder neck biopsy in November of 2007, status post bladder radiation for hematuria control completed in April of 2010, but never treated otherwise.  (2) on hemodialysis on Tuesdays, Thursdays and Saturdays.  (3) left axillary lymphadenopathy  (4) left inguinal lymphadenopathy and associated reducible hernia cannot be excluded  (5) hypertension noncompliant with medications  (6) Hypercalcemia most likely secondary to increased intake of calcium tums: She stopped taking calcium TUMS for  the past 10 days. Repeat calcium level is pending.   PLAN:   1.  I have reviewed CT of the chest and abdomen findings with the patient, family with the help of Spanish interpreter. CT of the chest and abdomen and pelvis revealed large volume ascites in the abdomen, subtle nodularity in the liver contours with a small perigastric varices.  left inguinal lymph node measuring around 5.4x3. 9 cm which is increased when compared to the previous CAT scan performed in February 2010. There were also scattered periaortic lymph nodes appreciated. Her hepatitis panel was negative.  2. Ms.Buser is scheduled to see surgery Dr. Marlou Starks today. Will request surgery for possible biopsy of the left inguinal lymphadenopathy. Surgical specimen to  be sent for pathology for  flow cytometry and  cytogenetics to rule out lymphoma recurrence.    3. Bilateral screening mammogram has been ordered  4. we'll arrange for diagnostic and therapeutic paracentesis with fluid cytology  5. we'll follow up with repeat calcium level which was performed today  6. I have discussed with the patient on importance of taking her blood  pressure medications and instructed her to follow up with primary care physician as scheduled  Next follow up in 2 weeks with Dr. Jana Hakim   Ms. Burford understood the plan of care and she knows to call us with any changes or problems.  Total time spent: 30 minutes   Wilmon Arms, M.D. Medical oncology     03/24/2013

## 2013-03-25 LAB — HEPATITIS C ANTIBODY: HCV AB: NEGATIVE

## 2013-03-25 LAB — BETA 2 MICROGLOBULIN, SERUM: Beta-2 Microglobulin: 29.21 mg/L — ABNORMAL HIGH (ref 1.01–1.73)

## 2013-03-29 ENCOUNTER — Ambulatory Visit (HOSPITAL_COMMUNITY)
Admission: RE | Admit: 2013-03-29 | Discharge: 2013-03-29 | Disposition: A | Discharge status: Home / Self Care | Payer: Medicare Other | Source: Ambulatory Visit | Attending: Hematology and Oncology | Admitting: Hematology and Oncology

## 2013-03-29 DIAGNOSIS — C8590 Non-Hodgkin lymphoma, unspecified, unspecified site: Secondary | ICD-10-CM

## 2013-03-29 DIAGNOSIS — R188 Other ascites: Secondary | ICD-10-CM | POA: Insufficient documentation

## 2013-03-29 DIAGNOSIS — Z8579 Personal history of other malignant neoplasms of lymphoid, hematopoietic and related tissues: Secondary | ICD-10-CM

## 2013-03-29 DIAGNOSIS — R109 Unspecified abdominal pain: Secondary | ICD-10-CM | POA: Insufficient documentation

## 2013-03-29 NOTE — Procedures (Addendum)
Successful US guided paracentesis from [facility].  Yielded 4.8L of dark yellow fluid.  No immediate complications.  Pt tolerated well.   Specimen was sent for labs.  Ascencion Dike PA-C 03/29/2013 11:16 AM

## 2013-03-31 ENCOUNTER — Ambulatory Visit
Admission: RE | Admit: 2013-03-31 | Discharge: 2013-03-31 | Disposition: A | Discharge status: Home / Self Care | Payer: BC Managed Care – PPO | Source: Ambulatory Visit | Attending: Hematology and Oncology | Admitting: Hematology and Oncology

## 2013-03-31 ENCOUNTER — Other Ambulatory Visit: Payer: Self-pay | Admitting: Hematology and Oncology

## 2013-03-31 ENCOUNTER — Other Ambulatory Visit (HOSPITAL_COMMUNITY): Payer: Self-pay | Admitting: Hematology and Oncology

## 2013-03-31 ENCOUNTER — Ambulatory Visit
Admission: RE | Admit: 2013-03-31 | Discharge: 2013-03-31 | Disposition: A | Discharge status: Home / Self Care | Payer: Medicare Other | Source: Ambulatory Visit | Attending: Hematology and Oncology | Admitting: Hematology and Oncology

## 2013-03-31 DIAGNOSIS — Z1231 Encounter for screening mammogram for malignant neoplasm of breast: Secondary | ICD-10-CM

## 2013-03-31 DIAGNOSIS — C8589 Other specified types of non-Hodgkin lymphoma, extranodal and solid organ sites: Secondary | ICD-10-CM

## 2013-04-02 ENCOUNTER — Other Ambulatory Visit: Payer: Self-pay | Admitting: Hematology and Oncology

## 2013-04-02 DIAGNOSIS — R591 Generalized enlarged lymph nodes: Secondary | ICD-10-CM

## 2013-04-05 ENCOUNTER — Ambulatory Visit (HOSPITAL_COMMUNITY): Admission: RE | Admit: 2013-04-05 | Payer: Medicare Other | Source: Ambulatory Visit

## 2013-04-06 ENCOUNTER — Other Ambulatory Visit: Payer: Self-pay | Admitting: Hematology and Oncology

## 2013-04-06 DIAGNOSIS — N186 End stage renal disease: Secondary | ICD-10-CM

## 2013-04-06 DIAGNOSIS — Z992 Dependence on renal dialysis: Secondary | ICD-10-CM

## 2013-04-06 DIAGNOSIS — C8590 Non-Hodgkin lymphoma, unspecified, unspecified site: Secondary | ICD-10-CM

## 2013-04-07 ENCOUNTER — Telehealth: Payer: Self-pay | Admitting: *Deleted

## 2013-04-07 ENCOUNTER — Ambulatory Visit (HOSPITAL_BASED_OUTPATIENT_CLINIC_OR_DEPARTMENT_OTHER): Payer: Medicare Other | Admitting: Hematology and Oncology

## 2013-04-07 ENCOUNTER — Other Ambulatory Visit (HOSPITAL_BASED_OUTPATIENT_CLINIC_OR_DEPARTMENT_OTHER): Payer: Medicare Other

## 2013-04-07 VITALS — BP 184/66 | HR 69 | Temp 99.0°F | Resp 18 | Ht 60.0 in | Wt 122.7 lb

## 2013-04-07 DIAGNOSIS — C8299 Follicular lymphoma, unspecified, extranodal and solid organ sites: Secondary | ICD-10-CM

## 2013-04-07 DIAGNOSIS — R188 Other ascites: Secondary | ICD-10-CM

## 2013-04-07 DIAGNOSIS — C8589 Other specified types of non-Hodgkin lymphoma, extranodal and solid organ sites: Secondary | ICD-10-CM

## 2013-04-07 DIAGNOSIS — K7460 Unspecified cirrhosis of liver: Secondary | ICD-10-CM

## 2013-04-07 DIAGNOSIS — N186 End stage renal disease: Secondary | ICD-10-CM

## 2013-04-07 DIAGNOSIS — Z992 Dependence on renal dialysis: Secondary | ICD-10-CM

## 2013-04-07 DIAGNOSIS — I10 Essential (primary) hypertension: Secondary | ICD-10-CM

## 2013-04-07 DIAGNOSIS — R011 Cardiac murmur, unspecified: Secondary | ICD-10-CM

## 2013-04-07 DIAGNOSIS — C8590 Non-Hodgkin lymphoma, unspecified, unspecified site: Secondary | ICD-10-CM

## 2013-04-07 LAB — COMPREHENSIVE METABOLIC PANEL (CC13)
ALK PHOS: 294 U/L — AB (ref 40–150)
ALT: 10 U/L (ref 0–55)
AST: 15 U/L (ref 5–34)
Albumin: 3 g/dL — ABNORMAL LOW (ref 3.5–5.0)
Anion Gap: 14 mEq/L — ABNORMAL HIGH (ref 3–11)
BUN: 28 mg/dL — AB (ref 7.0–26.0)
CO2: 27 mEq/L (ref 22–29)
Calcium: 10.2 mg/dL (ref 8.4–10.4)
Chloride: 99 mEq/L (ref 98–109)
Creatinine: 4.4 mg/dL (ref 0.6–1.1)
Glucose: 197 mg/dl — ABNORMAL HIGH (ref 70–140)
Potassium: 3.8 mEq/L (ref 3.5–5.1)
SODIUM: 140 meq/L (ref 136–145)
TOTAL PROTEIN: 6.4 g/dL (ref 6.4–8.3)
Total Bilirubin: 0.49 mg/dL (ref 0.20–1.20)

## 2013-04-07 LAB — CBC WITH DIFFERENTIAL/PLATELET
BASO%: 1.3 % (ref 0.0–2.0)
Basophils Absolute: 0.1 10*3/uL (ref 0.0–0.1)
EOS%: 4.2 % (ref 0.0–7.0)
Eosinophils Absolute: 0.2 10*3/uL (ref 0.0–0.5)
HCT: 31 % — ABNORMAL LOW (ref 34.8–46.6)
HGB: 10.4 g/dL — ABNORMAL LOW (ref 11.6–15.9)
LYMPH%: 20 % (ref 14.0–49.7)
MCH: 33.5 pg (ref 25.1–34.0)
MCHC: 33.6 g/dL (ref 31.5–36.0)
MCV: 99.8 fL (ref 79.5–101.0)
MONO#: 0.5 10*3/uL (ref 0.1–0.9)
MONO%: 11.6 % (ref 0.0–14.0)
NEUT#: 2.9 10*3/uL (ref 1.5–6.5)
NEUT%: 62.9 % (ref 38.4–76.8)
Platelets: 179 10*3/uL (ref 145–400)
RBC: 3.11 10*6/uL — AB (ref 3.70–5.45)
RDW: 13.4 % (ref 11.2–14.5)
WBC: 4.5 10*3/uL (ref 3.9–10.3)
lymph#: 0.9 10*3/uL (ref 0.9–3.3)

## 2013-04-07 LAB — LACTATE DEHYDROGENASE (CC13): LDH: 152 U/L (ref 125–245)

## 2013-04-07 NOTE — Telephone Encounter (Signed)
appts made and printed. Pt is aware that cs will call w/ appt for PET...td 

## 2013-04-08 ENCOUNTER — Institutional Professional Consult (permissible substitution): Payer: Medicare Other | Admitting: Cardiovascular Disease

## 2013-04-12 ENCOUNTER — Institutional Professional Consult (permissible substitution): Payer: Medicare Other | Admitting: Cardiovascular Disease

## 2013-04-14 ENCOUNTER — Telehealth (INDEPENDENT_AMBULATORY_CARE_PROVIDER_SITE_OTHER): Payer: Self-pay | Admitting: *Deleted

## 2013-04-14 NOTE — Telephone Encounter (Signed)
GI referral was sent over to [facility] on 03/25/13, Christy with [facility] has since called pt and LM with pts husband to call back to get an appt scheduled.  At this time patient has not called back to schedule an appt.

## 2013-04-15 ENCOUNTER — Other Ambulatory Visit: Payer: Self-pay | Admitting: Hematology and Oncology

## 2013-04-15 NOTE — Progress Notes (Signed)
. IDMalachy Jordan   DOB: 18-Sep-1940  MR#: [id number]  CSN#:[id number]  PCP: Valerie Czech, MD GYN:  SU: ValerieToth Jordan OTHER MD:  DIAGNOSIS: Non-Hodgkin's lymphoma  Chief complaint: Follow up visit for lymphoma and  review of CAT scans  HISTORY OF PRESENT ILLNESS: As per previously documented note: The patient was originally referred in 2007, when she presented with gross hematuria.  Dr. Jeffie Jordan obtained CT urogram, which showed small kidneys bilaterally (the patient has end-stage renal disease) with no obvious stones or renal lesions.  In the bladder, there was a donut-shaped mass at the bladder neck and cystoscopy showed a smooth bladder without papillary lesions but on retroflexion there was a donut-like lesion relatively circumferential at the bladder neck with some bleeding on the posterior aspect.  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  The patient was referred for further evaluation but failed to show (I actually wrote her a letter in [place] with a 2nd appointment but she failed to show for that appointment as well.).   More recently, the patient again developed gross hematuria.  She again went to see Dr. Jeffie Jordan and he again obtained a CT of the abdomen and pelvis without contrast.  Interestingly, some of the abdominal lymph nodes previously noted are now a little bit smaller.  The left ureter, which was questionably or intermittently dilated previously, is now clearly dilated to the level of S3, at which point it is difficult to follow.  There is bladder wall thickening, particularly on the right, and there is an enlarged left groin lymph node measuring 2.5 cm.  Otherwise, the left groin adenopathy is decreased compared to 2007. Her subsequent history is as detailed below.  INTERVAL HISTORY: [name] returns today with her  grandson) for followup of her non-Hodgkin's lymphoma . She denies abdominal pain,she reports increasing  abdominal size.She has good appetite,denies vomiting.She denies fever. She reports dyspnea when ambulating.Dialysis ongoing without new problems.Denies pelvic pain or bone or back pain. She has a  lump in the left inguinal area  and she complains of discomfort in that area,seen by surgery and consistent with inguinal hernia.Patient thinks might be present for 20 years. She had large volume abdominal paracentesis performed on 03/30/2013.She has history of cirrhosis with ascites but no history of [name] or hepatitis.  REVIEW OF SYSTEMS:  She has chronic constipation. No  chest pain, palpitations, headaches, blurred vision.   A 14 point review of systems is been assessed and the pertinent findings are mentioned in interval history   PAST MEDICAL HISTORY: Past Medical History  Diagnosis Date  . Cancer   . ESRD on dialysis     dialysis  . Hypertension   . Diabetes mellitus   . Bladder cancer Jan 10, 2006    BLADDER NECK, BIOPSY: DIFFUSE LARGE B-CELL LYMPHOMA. CT showed worrisome pelvic LN. No treatment records in EPIC.  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  She has a fistula in the left upper extremity placed June of 2007.  Her diabetes, she says, is well controlled.  She is essentially blind secondary to that.  There is also a history of hypothyroidism.  PAST SURGICAL HISTORY: Past Surgical History  Procedure Laterality Date  . Bladder cancer    . Renal failure    . Insertion of  dialysis catheter      FAMILY HISTORY Family History  Problem Relation Age of Onset  . Thyroid disease Mother   . Lung cancer Father   The patient's father died at the age of 73 from "lung problems".  The patient's mother died at the age of 41 from pneumonia.  The patient has 3 sisters and  4 brothers.  She is not aware of a history of cancer in the family.    GYNECOLOGIC HISTORY: She had 7 children.  She does not recall when she went through the change of life.  She did not take hormones.  SOCIAL HISTORY: She was born in [place] in [place].  She has lived in the [place] about 8 years.  Her husband, [name], is retired from [employer] and is present today, as are 2 children, Mudlogger (who works in a [place]) and Educational psychologist (who works in [employer]).  The patient has more grandchildren than she can count, and innumerable great-grandchildren as well.     ADVANCED DIRECTIVES:  HEALTH MAINTENANCE: History  Substance Use Topics  . Smoking status: Never Smoker   . Smokeless tobacco: Never Used  . Alcohol Use: No     Colonoscopy:  PAP:  Bone density:  Lipid panel:  No Known Allergies  Current Outpatient Prescriptions  Medication Sig Dispense Refill  . calcium carbonate (TUMS - DOSED IN MG ELEMENTAL CALCIUM) 500 MG chewable tablet Chew 1 tablet by mouth 2 (two) times daily.      Marland Kitchen glipiZIDE (GLUCOTROL) 10 MG tablet Take 10 mg by mouth 2 (two) times daily before a meal.      . labetalol (NORMODYNE) 300 MG tablet Take 200 mg by mouth 2 (two) times daily.      Marland Kitchen omeprazole (PRILOSEC) 20 MG capsule Take 20 mg by mouth daily.      . sorbitol 70 % solution Take 15 mLs by mouth 2 (two) times daily as needed. For bowel movement  473 mL  0  . traMADol-acetaminophen (ULTRACET) 37.5-325 MG per tablet Take 1 tablet by mouth every 6 (six) hours as needed for pain.  30 tablet  0   No current facility-administered medications for this visit.    OBJECTIVE: Elderly Hispanic woman in no acute distress Filed Vitals:   04/07/13 1034  BP: 184/66  Pulse: 69  Temp: 99 F (37.2 C)  Resp: 18     Body mass index is 23.96 kg/(m^2).    ECOG FS: 1 Filed Weights   04/07/13 1034  Weight: 122 lb 11.2 oz (55.656 kg)   HEENT PERRLA, Sclerae anicteric  Oropharynx clear   Small  left axillary lymphadenopathy is noted , approximately 1 cm in size  Lungs no rales or rhonchi Heart regular rate and rhythm, loud systolic murmur present left parasternal Abdomen: distended, positive bowel sounds, nontender to palpation. Patient has  reducible hernia in the left inguinal area ascites present not tense MSK no focal spinal tenderness, no peripheral edema Neuro: Alert oriented x3, no focal deficits  Breasts: No masses felt  Skin:  Erythematous rash present on the right upper chest wall with several vesicles present Extremities: No cyanosis no clubbing and no edema No peripheral adenopathy present.   LAB RESULTS: Lab Results  Component Value Date   WBC 4.5 04/07/2013   NEUTROABS 2.9 04/07/2013   HGB 10.4* 04/07/2013   HCT 31.0* 04/07/2013   MCV 99.8 04/07/2013   PLT 179 04/07/2013      Chemistry  Component Value Date/Time   NA 140 04/07/2013 1015   NA 137 04/08/2012 1416   K 3.8 04/07/2013 1015   K 3.6 04/08/2012 1416   CL 97 04/08/2012 1416   CL 97* 10/16/2011 1425   CO2 27 04/07/2013 1015   CO2 28 04/08/2012 1416   BUN 28.0* 04/07/2013 1015   BUN 30* 04/08/2012 1416   CREATININE 4.4* 04/07/2013 1015   CREATININE 4.83* 04/08/2012 1416      Component Value Date/Time   CALCIUM 10.2 04/07/2013 1015   CALCIUM 10.6* 04/08/2012 1416   ALKPHOS 294* 04/07/2013 1015   ALKPHOS 149* 04/08/2012 1416   AST 15 04/07/2013 1015   AST 20 04/08/2012 1416   ALT 10 04/07/2013 1015   ALT 10 04/08/2012 1416   BILITOT 0.49 04/07/2013 1015   BILITOT 0.4 04/08/2012 1416     LDH: 162 10/16/2011   STUDIES:  US Pelvis Limited  10/30/2011  *RADIOLOGY REPORT*  Clinical Data: Pelvic pain.  History of lymphoma and bladder cancer.  US PELVIS LIMITED OR FOLLOW UP  Technique:  Transabdominal ultrasound examination of the pelvis was performed including evaluation of the uterus, ovaries, adnexal regions, and pelvic cul-de-sac.  Comparison: No recent exams.  Findings: Small amount of ascites which may be  loculated.  Evaluation is otherwise limited. If further delineation is clinically desired, follow-up CT may be considered.  IMPRESSION: Small amount of ascites which may be loculated.  Evaluation is otherwise limited.   Original Report Authenticated By: Doug Sou, M.D.     ASSESSMENT: 73 y.o. female with a history of   (1) non-Hodgkin's lymphoma, diffuse large B-cell subtype, diagnosed through bladder neck biopsy in November of 2007, status post bladder radiation for hematuria control completed in April of 2010, without any additional treatment. Will order PET scan to evaluate enlarging left inguinal adenopathy concerned regarding lymphoma progression.  (2) ESRD on hemodialysis  (3) left  Inguinal hernia increased due to ascites no surgery unless other medical problems stabilize  (5) hypertension noncompliance with medications  (6) Ascites with liver cirrhosis with recent abdominal paracentesis 03/30/2013 fluid cytology negative for lymphoma reactive mesothelial cells present and benign appearing lymphocytes.Will refer to GI for further evaluation of ascites.  (70 will refer to Cardiology for evaluation of heart murmur,possible right sided heart failure contributing to ascites.Will  order ECHO.Recent CT chest with cardiomegaly and left sided pleural effusion.   PLAN:  As above  Next follow up in 2 weeks .   Ms. Schnelle understood the plan of care and she knows to call us with any changes or problems.  Total time spent: 30 minutes   Amada Kingfisher, M.D. Medical oncology     04/15/2013

## 2013-04-16 ENCOUNTER — Other Ambulatory Visit: Payer: Self-pay | Admitting: Hematology and Oncology

## 2013-04-20 ENCOUNTER — Other Ambulatory Visit: Payer: Self-pay

## 2013-04-20 ENCOUNTER — Telehealth: Payer: Self-pay | Admitting: Oncology

## 2013-04-20 ENCOUNTER — Ambulatory Visit (HOSPITAL_COMMUNITY): Admission: RE | Admit: 2013-04-20 | Payer: Medicare Other | Source: Ambulatory Visit

## 2013-04-20 NOTE — Progress Notes (Signed)
Spoke with Deatri from radiology, about rescheduling patients PET scan d/t dialysis for 04/28/13. Sent in POF for f/u to be rescheduled per Dr.Faidas.

## 2013-04-20 NOTE — Telephone Encounter (Signed)
, °

## 2013-04-21 ENCOUNTER — Ambulatory Visit: Payer: Medicare Other

## 2013-04-21 ENCOUNTER — Ambulatory Visit (HOSPITAL_BASED_OUTPATIENT_CLINIC_OR_DEPARTMENT_OTHER): Payer: Medicare Other | Admitting: Hematology and Oncology

## 2013-04-21 ENCOUNTER — Other Ambulatory Visit: Payer: Medicare Other

## 2013-04-21 ENCOUNTER — Encounter: Payer: Self-pay | Admitting: Hematology and Oncology

## 2013-04-21 VITALS — BP 164/69 | HR 65 | Temp 98.4°F | Resp 18 | Ht 60.0 in | Wt 123.6 lb

## 2013-04-21 DIAGNOSIS — C8590 Non-Hodgkin lymphoma, unspecified, unspecified site: Secondary | ICD-10-CM

## 2013-04-21 DIAGNOSIS — Z992 Dependence on renal dialysis: Secondary | ICD-10-CM

## 2013-04-21 DIAGNOSIS — R188 Other ascites: Secondary | ICD-10-CM

## 2013-04-21 DIAGNOSIS — N186 End stage renal disease: Secondary | ICD-10-CM

## 2013-04-21 DIAGNOSIS — K7460 Unspecified cirrhosis of liver: Secondary | ICD-10-CM

## 2013-04-21 DIAGNOSIS — I120 Hypertensive chronic kidney disease with stage 5 chronic kidney disease or end stage renal disease: Secondary | ICD-10-CM

## 2013-04-21 DIAGNOSIS — C8589 Other specified types of non-Hodgkin lymphoma, extranodal and solid organ sites: Secondary | ICD-10-CM

## 2013-04-21 DIAGNOSIS — R011 Cardiac murmur, unspecified: Secondary | ICD-10-CM

## 2013-04-21 NOTE — Progress Notes (Signed)
[name]   DOB: 01/24/1941  MR#: [id number]  CSN#:[id number]  PCP: Valerie Czech, MD GYN:  SU: [name] OTHER MD:  DIAGNOSIS: Non-Hodgkin's lymphoma  Chief complaint: Follow up visit for lymphoma and  review of CAT scans  HISTORY OF PRESENT ILLNESS: As per previously documented note: The patient was originally referred in 2007, when she presented with gross hematuria.  Dr. Jeffie Jordan obtained CT urogram, which showed small kidneys bilaterally (the patient has end-stage renal disease) with no obvious stones or renal lesions.  In the bladder, there was a donut-shaped mass at the bladder neck and cystoscopy showed a smooth bladder without papillary lesions but on retroflexion there was a donut-like lesion relatively circumferential at the bladder neck with some bleeding on the posterior aspect.  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  The patient was referred for further evaluation but failed to show (I actually wrote her a letter in [place] with a 2nd appointment but she failed to show for that appointment as well.).   More recently, the patient again developed gross hematuria.  She again went to see Dr. Jeffie Jordan and he again obtained a CT of the abdomen and pelvis without contrast.  Interestingly, some of the abdominal lymph nodes previously noted are now a little bit smaller.  The left ureter, which was questionably or intermittently dilated previously, is now clearly dilated to the level of S3, at which point it is difficult to follow.  There is bladder wall thickening, particularly on the right, and there is an enlarged left groin lymph node measuring 2.5 cm.  Otherwise, the left groin adenopathy is decreased compared to 2007. Her subsequent history is as detailed below.  INTERVAL HISTORY: [name] returns today with her  grandson) for followup of her non-Hodgkin's lymphoma . She denies abdominal pain,she reports increasing  abdominal size.She has good appetite,denies vomiting.She denies fever. She reports dyspnea when ambulating.Dialysis ongoing without new problems.Denies pelvic pain or bone or back pain. She has a  lump in the left inguinal area  and she complains of discomfort in that area,seen by surgery and consistent with inguinal hernia.Patient thinks might be present for 20 years. She had large volume abdominal paracentesis performed on 03/30/2013.She has history of cirrhosis with ascites but no history of ETOH or hepatitis.  Patient comes in for her follow up .She did go for her scheduled ECHO but did not go for her PET scan.Aso unclear when she is scheduled for Cardiology and  Gastroenterology.  REVIEW OF SYSTEMS:  She has chronic constipation. but diarrhea at times. No  chest pain, palpitations, headaches, blurred vision.   A 14 point review of systems is been assessed and the pertinent findings are mentioned in interval history and previously.   PAST MEDICAL HISTORY: Past Medical History  Diagnosis Date  . Cancer   . ESRD on dialysis     dialysis  . Hypertension   . Diabetes mellitus   . Bladder cancer Jan 10, 2006    BLADDER NECK, BIOPSY: DIFFUSE LARGE B-CELL LYMPHOMA. CT showed worrisome pelvic LN. No treatment records in EPIC.  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  She has a fistula in the left upper extremity placed June of 2007.  Her diabetes, she says, is well controlled.  She is essentially blind secondary to that.  There is also a history of hypothyroidism.  PAST SURGICAL HISTORY: Past Surgical History  Procedure Laterality Date  . Bladder cancer    . Renal failure    . Insertion of dialysis catheter      FAMILY HISTORY Family History  Problem Relation  Age of Onset  . Thyroid disease Mother   . Lung cancer Father   The patient's father died at the age of 77 from "lung problems".  The patient's mother died at the age of 5 from pneumonia.  The patient has 3 sisters and 4 brothers.  She is not aware of a history of cancer in the family.    GYNECOLOGIC HISTORY: She had 7 children.  She does not recall when she went through the change of life.  She did not take hormones.  SOCIAL HISTORY: She was born in [place] in [place].  She has lived in the [place] about 8 years.  Her husband, [name], is retired from Architect and is present today, as are 2 children, [name] (who works in a [place]) and [name] (who works in [employer]).  The patient has more grandchildren than she can count, and innumerable great-grandchildren as well.     ADVANCED DIRECTIVES:  HEALTH MAINTENANCE: History  Substance Use Topics  . Smoking status: Never Smoker   . Smokeless tobacco: Never Used  . Alcohol Use: No     Colonoscopy:  PAP:  Bone density:  Lipid panel:  No Known Allergies  Current Outpatient Prescriptions  Medication Sig Dispense Refill  . calcium carbonate (TUMS - DOSED IN MG ELEMENTAL CALCIUM) 500 MG chewable tablet Chew 1 tablet by mouth 2 (two) times daily.      Marland Kitchen glipiZIDE (GLUCOTROL) 10 MG tablet Take 10 mg by mouth 2 (two) times daily before a meal.      . glyBURIDE (DIABETA) 5 MG tablet       . hydrochlorothiazide (HYDRODIURIL) 12.5 MG tablet       . labetalol (NORMODYNE) 300 MG tablet Take 200 mg by mouth 2 (two) times daily.      Marland Kitchen losartan (COZAAR) 100 MG tablet       . omeprazole (PRILOSEC) 20 MG capsule Take 20 mg by mouth daily.      . SENSIPAR 60 MG tablet       . sorbitol 70 % solution Take 15 mLs by mouth 2 (two) times daily as needed. For bowel movement  473 mL  0  . traMADol-acetaminophen (ULTRACET) 37.5-325 MG per tablet Take 1 tablet by mouth every 6 (six) hours as needed for pain.  30 tablet  0   No current  facility-administered medications for this visit.    OBJECTIVE: Elderly Hispanic woman in no acute distress Filed Vitals:   04/21/13 1004  BP: 164/69  Pulse: 65  Temp: 98.4 F (36.9 C)  Resp: 18     Body mass index is 24.14 kg/(m^2).    ECOG FS: 1 Filed Weights   04/21/13 1004  Weight: 123 lb 9.6 oz (56.065 kg)   HEENT PERRLA, Sclerae anicteric  Oropharynx clear   Small left axillary lymphadenopathy is noted , approximately 1 cm in size  Lungs no rales or rhonchi Heart regular rate and rhythm, loud systolic murmur present left parasternal Abdomen: distended, positive bowel sounds, nontender to palpation. Patient has  reducible hernia in the left inguinal area ascites present not tense MSK no focal spinal tenderness, no peripheral edema Neuro:  Alert oriented x3, no focal deficits  Breasts: No masses felt  Skin: no significant skin rash  Extremities: No cyanosis no clubbing and no edema No peripheral adenopathy present.   LAB RESULTS: Lab Results  Component Value Date   WBC 4.5 04/07/2013   NEUTROABS 2.9 04/07/2013   HGB 10.4* 04/07/2013   HCT 31.0* 04/07/2013   MCV 99.8 04/07/2013   PLT 179 04/07/2013      Chemistry      Component Value Date/Time   NA 140 04/07/2013 1015   NA 137 04/08/2012 1416   K 3.8 04/07/2013 1015   K 3.6 04/08/2012 1416   CL 97 04/08/2012 1416   CL 97* 10/16/2011 1425   CO2 27 04/07/2013 1015   CO2 28 04/08/2012 1416   BUN 28.0* 04/07/2013 1015   BUN 30* 04/08/2012 1416   CREATININE 4.4* 04/07/2013 1015   CREATININE 4.83* 04/08/2012 1416      Component Value Date/Time   CALCIUM 10.2 04/07/2013 1015   CALCIUM 10.6* 04/08/2012 1416   ALKPHOS 294* 04/07/2013 1015   ALKPHOS 149* 04/08/2012 1416   AST 15 04/07/2013 1015   AST 20 04/08/2012 1416   ALT 10 04/07/2013 1015   ALT 10 04/08/2012 1416   BILITOT 0.49 04/07/2013 1015   BILITOT 0.4 04/08/2012 1416     LDH: 162 10/16/2011   STUDIES:  US Pelvis Limited  10/30/2011  *RADIOLOGY REPORT*  Clinical Data:  Pelvic pain.  History of lymphoma and bladder cancer.  US PELVIS LIMITED OR FOLLOW UP  Technique:  Transabdominal ultrasound examination of the pelvis was performed including evaluation of the uterus, ovaries, adnexal regions, and pelvic cul-de-sac.  Comparison: No recent exams.  Findings: Small amount of ascites which may be loculated.  Evaluation is otherwise limited. If further delineation is clinically desired, follow-up CT may be considered.  IMPRESSION: Small amount of ascites which may be loculated.  Evaluation is otherwise limited.   Original Report Authenticated By: Doug Sou, M.D.     ASSESSMENT: 73 y.o. female with a history of   (1) non-Hodgkin's lymphoma, diffuse large B-cell subtype, diagnosed through bladder neck biopsy in November of 2007, status post bladder radiation for hematuria control completed in April of 2010, without any additional treatment. Will order PET scan to evaluate enlarging left inguinal adenopathy concerned regarding lymphoma progression.This is now rescheduled for 04/28/2013 and   advised patient to keep that  appointment.  (2) ESRD on hemodialysis  (3) left  Inguinal hernia increased due to ascites no surgery unless other medical problems stabilize  (5) hypertension noncompliance with medications better today  (6) Ascites with liver cirrhosis with recent abdominal paracentesis 03/30/2013 fluid cytology negative for lymphoma reactive mesothelial cells present and benign appearing lymphocytes.Will refer to GI for further evaluation of ascites.adviced patient to keep follow up.  (67 will refer to Cardiology for evaluation of heart murmur,possible right sided heart failure contributing to ascites.Recent CT chest with cardiomegaly and left sided pleural effusion. ECHO  04/09/2013 indicates Ef 55-60% concentric hypertrophy and valvular problems.   PLAN:  As above  Next follow up  After the PET scan.   Ms. Lhommedieu understood the plan of care and she knows to  call us with any changes or problems.  Total time spent: 30 minutes   Amada Kingfisher, M.D. Medical oncology     04/21/2013

## 2013-04-23 ENCOUNTER — Encounter: Payer: Self-pay | Admitting: General Surgery

## 2013-04-23 ENCOUNTER — Telehealth: Payer: Self-pay | Admitting: Oncology

## 2013-04-28 ENCOUNTER — Encounter (HOSPITAL_COMMUNITY)
Admission: RE | Admit: 2013-04-28 | Discharge: 2013-04-28 | Disposition: A | Discharge status: Home / Self Care | Payer: Medicare Other | Source: Ambulatory Visit | Attending: Hematology and Oncology | Admitting: Hematology and Oncology

## 2013-04-28 ENCOUNTER — Encounter (HOSPITAL_COMMUNITY): Payer: Self-pay

## 2013-04-28 DIAGNOSIS — C8299 Follicular lymphoma, unspecified, extranodal and solid organ sites: Secondary | ICD-10-CM | POA: Insufficient documentation

## 2013-04-28 LAB — GLUCOSE, CAPILLARY: GLUCOSE-CAPILLARY: 135 mg/dL — AB (ref 70–99)

## 2013-04-28 MED ORDER — FLUDEOXYGLUCOSE F - 18 (FDG) INJECTION
8.7000 | Freq: Once | INTRAVENOUS | Status: AC | PRN
  Administered 2013-04-28: 8.7 via INTRAVENOUS

## 2013-04-30 ENCOUNTER — Ambulatory Visit (HOSPITAL_BASED_OUTPATIENT_CLINIC_OR_DEPARTMENT_OTHER): Payer: Medicare Other | Admitting: Hematology and Oncology

## 2013-04-30 ENCOUNTER — Other Ambulatory Visit: Payer: Medicare Other

## 2013-04-30 ENCOUNTER — Encounter: Payer: Self-pay | Admitting: Gastroenterology

## 2013-04-30 ENCOUNTER — Telehealth: Payer: Self-pay | Admitting: Hematology and Oncology

## 2013-04-30 ENCOUNTER — Ambulatory Visit: Payer: Medicare Other

## 2013-04-30 VITALS — BP 178/53 | HR 58 | Temp 98.4°F | Resp 18 | Ht 60.0 in | Wt 122.8 lb

## 2013-04-30 DIAGNOSIS — C8589 Other specified types of non-Hodgkin lymphoma, extranodal and solid organ sites: Secondary | ICD-10-CM

## 2013-04-30 DIAGNOSIS — Z992 Dependence on renal dialysis: Secondary | ICD-10-CM

## 2013-04-30 DIAGNOSIS — R188 Other ascites: Secondary | ICD-10-CM

## 2013-04-30 DIAGNOSIS — N186 End stage renal disease: Secondary | ICD-10-CM

## 2013-04-30 DIAGNOSIS — C8590 Non-Hodgkin lymphoma, unspecified, unspecified site: Secondary | ICD-10-CM

## 2013-04-30 DIAGNOSIS — K7460 Unspecified cirrhosis of liver: Secondary | ICD-10-CM

## 2013-04-30 DIAGNOSIS — K4090 Unilateral inguinal hernia, without obstruction or gangrene, not specified as recurrent: Secondary | ICD-10-CM

## 2013-05-01 ENCOUNTER — Encounter: Payer: Self-pay | Admitting: Hematology and Oncology

## 2013-05-01 ENCOUNTER — Other Ambulatory Visit: Payer: Self-pay | Admitting: Hematology and Oncology

## 2013-05-01 DIAGNOSIS — R591 Generalized enlarged lymph nodes: Secondary | ICD-10-CM

## 2013-05-01 NOTE — Progress Notes (Signed)
Marland Kitchen IDMalachy Chamber   DOB: 01/07/1941  MR#: [id number]  CSN#:[id number]  PCP: Garnette Czech, MD GYN:  SU: Dr.Toth III OTHER MD:  DIAGNOSIS: Non-Hodgkin's lymphoma  Chief complaint: Follow up visit for lymphoma and  review of PET scans  HISTORY OF PRESENT ILLNESS: As per previously documented note: The patient was originally referred in 2007, when she presented with gross hematuria.  Dr. Jeffie Pollock obtained CT urogram, which showed small kidneys bilaterally (the patient has end-stage renal disease) with no obvious stones or renal lesions.  In the bladder, there was a donut-shaped mass at the bladder neck and cystoscopy showed a smooth bladder without papillary lesions but on retroflexion there was a donut-like lesion relatively circumferential at the bladder neck with some bleeding on the posterior aspect.  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  The patient was referred for further evaluation but failed to show (I actually wrote her a letter in [place] with a 2nd appointment but she failed to show for that appointment as well.).   More recently, the patient again developed gross hematuria.  She again went to see Dr. Jeffie Pollock and he again obtained a CT of the abdomen and pelvis without contrast.  Interestingly, some of the abdominal lymph nodes previously noted are now a little bit smaller.  The left ureter, which was questionably or intermittently dilated previously, is now clearly dilated to the level of S3, at which point it is difficult to follow.  There is bladder wall thickening, particularly on the right, and there is an enlarged left groin lymph node measuring 2.5 cm.  Otherwise, the left groin adenopathy is decreased compared to 2007. Her subsequent history is as detailed below.  INTERVAL HISTORY: Mrs. Norma Fredrickson returns today with her  grandson) for followup of her non-Hodgkin's lymphoma . She denies abdominal pain,she reports  increasing abdominal size.She has good appetite,denies vomiting.She denies fever. She reports dyspnea when ambulating.Dialysis ongoing without new problems.Denies pelvic pain or bone or back pain. She has a  lump in the left inguinal area  and she complains of discomfort in that area,seen by surgery and consistent with inguinal hernia.Patient thinks might be present for 20 years. She had large volume abdominal paracentesis performed on 03/30/2013.She has history of cirrhosis with ascites but no history of ETOH or hepatitis.  Patient comes in for her follow up .She did go for her scheduled ECHO and did  go for her PET scan.[place] scheduled for Cardiology and  Grandson not clear about   Gastroenterology referral date.  REVIEW OF SYSTEMS:  She has chronic constipation. but diarrhea at times. No  chest pain, palpitations, headaches, blurred vision.  She reports abdominal distention but no new complains.  A 14 point review of systems is been assessed and the pertinent findings are mentioned in interval history and previously.   PAST MEDICAL HISTORY: Past Medical History  Diagnosis Date  . Cancer   . ESRD on dialysis     dialysis  . Hypertension   . Diabetes mellitus   . Bladder cancer Jan 10, 2006    BLADDER NECK, BIOPSY: DIFFUSE LARGE B-CELL LYMPHOMA. CT showed worrisome pelvic LN. No treatment records in EPIC.  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  She has a fistula in the left upper extremity  placed June of 2007.  Her diabetes, she says, is well controlled.  She is essentially blind secondary to that.  There is also a history of hypothyroidism.  PAST SURGICAL HISTORY: Past Surgical History  Procedure Laterality Date  . Bladder cancer    . Renal failure    .  Insertion of dialysis catheter      FAMILY HISTORY Family History  Problem Relation Age of Onset  . Thyroid disease Mother   . Lung cancer Father   The patient's father died at the age of 49 from "lung problems".  The patient's mother died at the age of 69 from pneumonia.  The patient has 3 sisters and 4 brothers.  She is not aware of a history of cancer in the family.    GYNECOLOGIC HISTORY: She had 7 children.  She does not recall when she went through the change of life.  She did not take hormones.  SOCIAL HISTORY: She was born in [place] in [place].  She has lived in the [place] about 8 years.  Her husband, Luiz Ochoa, is retired from Architect and is present today, as are 2 children, Mudlogger (who works in a [employer]) and Educational psychologist (who works in [employer]).  The patient has more grandchildren than she can count, and innumerable great-grandchildren as well.     ADVANCED DIRECTIVES:  HEALTH MAINTENANCE: History  Substance Use Topics  . Smoking status: Never Smoker   . Smokeless tobacco: Never Used  . Alcohol Use: No     Colonoscopy:  PAP:  Bone density:  Lipid panel:  No Known Allergies  Current Outpatient Prescriptions  Medication Sig Dispense Refill  . calcium carbonate (TUMS - DOSED IN MG ELEMENTAL CALCIUM) 500 MG chewable tablet Chew 1 tablet by mouth 2 (two) times daily.      Marland Kitchen glipiZIDE (GLUCOTROL) 10 MG tablet Take 10 mg by mouth 2 (two) times daily before a meal.      . glyBURIDE (DIABETA) 5 MG tablet       . hydrochlorothiazide (HYDRODIURIL) 12.5 MG tablet       . labetalol (NORMODYNE) 300 MG tablet Take 200 mg by mouth 2 (two) times daily.      Marland Kitchen losartan (COZAAR) 100 MG tablet       . omeprazole (PRILOSEC) 20 MG capsule Take 20 mg by mouth daily.      . SENSIPAR 60 MG tablet       . sorbitol 70 % solution Take 15 mLs by mouth 2 (two) times daily as needed. For bowel movement  473 mL  0  . traMADol-acetaminophen (ULTRACET) 37.5-325 MG per tablet  Take 1 tablet by mouth every 6 (six) hours as needed for pain.  30 tablet  0   No current facility-administered medications for this visit.    OBJECTIVE: Elderly Hispanic woman in no acute distress Filed Vitals:   04/30/13 0933  BP: 178/53  Pulse: 58  Temp: 98.4 F (36.9 C)  Resp: 18     Body mass index is 23.98 kg/(m^2).    ECOG FS: 1 Filed Weights   04/30/13 0933  Weight: 122 lb 12.8 oz (55.702 kg)   HEENT PERRLA, Sclerae anicteric  Oropharynx clear   Small left axillary lymphadenopathy is noted , approximately 1 cm in size  Lungs no rales or rhonchi Heart regular rate and rhythm, loud systolic murmur present left parasternal Abdomen: distended, positive bowel sounds, nontender to palpation. Patient has  reducible hernia in the left inguinal area  ascites present not tense MSK no focal spinal tenderness, no peripheral edema Neuro: Alert oriented x3, no focal deficits  Breasts: No masses felt  Skin: no significant skin rash  Extremities: No cyanosis no clubbing and no edema No peripheral adenopathy present.   LAB RESULTS: Lab Results  Component Value Date   WBC 4.5 04/07/2013   NEUTROABS 2.9 04/07/2013   HGB 10.4* 04/07/2013   HCT 31.0* 04/07/2013   MCV 99.8 04/07/2013   PLT 179 04/07/2013      Chemistry      Component Value Date/Time   NA 140 04/07/2013 1015   NA 137 04/08/2012 1416   K 3.8 04/07/2013 1015   K 3.6 04/08/2012 1416   CL 97 04/08/2012 1416   CL 97* 10/16/2011 1425   CO2 27 04/07/2013 1015   CO2 28 04/08/2012 1416   BUN 28.0* 04/07/2013 1015   BUN 30* 04/08/2012 1416   CREATININE 4.4* 04/07/2013 1015   CREATININE 4.83* 04/08/2012 1416      Component Value Date/Time   CALCIUM 10.2 04/07/2013 1015   CALCIUM 10.6* 04/08/2012 1416   ALKPHOS 294* 04/07/2013 1015   ALKPHOS 149* 04/08/2012 1416   AST 15 04/07/2013 1015   AST 20 04/08/2012 1416   ALT 10 04/07/2013 1015   ALT 10 04/08/2012 1416   BILITOT 0.49 04/07/2013 1015   BILITOT 0.4 04/08/2012 1416      LDH: 162 10/16/2011   STUDIES:  US Pelvis Limited CLINICAL DATA: Subsequent treatment strategy for non-Hodgkin's  lymphoma.  EXAM:  NUCLEAR MEDICINE PET SKULL BASE TO THIGH  TECHNIQUE:  8.7 mCi F-18 FDG was injected intravenously. Full-ring PET imaging  was performed from the skull base to thigh after the radiotracer. CT  data was obtained and used for attenuation correction and anatomic  localization.  FASTING BLOOD GLUCOSE: Value: 135 mg/dl  COMPARISON: [place] THORA/PARACENTESIS dated 06/11/2005; CT ABD/PELV WO CM  dated 03/19/2013 are  FINDINGS:  NECK  No hypermetabolic lymph nodes in the neck.  CHEST  No hypermetabolic mediastinal or hilar nodes. No suspicious  pulmonary nodules on the CT scan.  ABDOMEN/PELVIS  No abnormal hypermetabolic activity within the liver, pancreas,  adrenal glands, or spleen. No hypermetabolic lymph nodes in the  abdomen or pelvis.  Moderate volume of ascitic fluid within the item pelvis similar to  comparison CT.  There is a 5 cm rounded fluid collection in left groin. This likely  represents ascitic fluid communicating through a inguinal hernia.  SKELETON  No focal hypermetabolic activity to suggest skeletal metastasis.  IMPRESSION:  1. No evidence of hypermetabolic lymph nodes in the chest, abdomen,  or pelvis.  2. Benign appearing fluid collection within the right groin relates  to ascites and inguinal hernia.  3. Large volume ascites similar to comparison CT.  Electronically Signed  By: Suzy Bouchard M.D.  On: 04/28/2013 16:39             External Result Report     10/30/2011  *RADIOLOGY REPORT*  Clinical Data: Pelvic pain.  History of lymphoma and bladder cancer.  US PELVIS LIMITED OR FOLLOW UP  Technique:  Transabdominal ultrasound examination of the pelvis was performed including evaluation of the uterus, ovaries, adnexal regions, and pelvic cul-de-sac.  Comparison: No recent exams.  Findings: Small amount of ascites which may  be loculated.  Evaluation is otherwise limited. If further delineation is clinically desired, follow-up CT may be considered.  IMPRESSION: Small amount of ascites which may be loculated.  Evaluation is  otherwise limited.   Original Report Authenticated By: Doug Sou, M.D.     ASSESSMENT: 73 y.o. female with a history of   (1) non-Hodgkin's lymphoma, diffuse large B-cell subtype, diagnosed through bladder neck biopsy in November of 2007, status post bladder radiation for hematuria control completed in April of Continue to follow periodically  (2) ESRD on hemodialysis  (3) left  Inguinal hernia increased due to ascites no surgery unless other medical problems stabilize  (5) hypertension noncompliance with medications better today  (6) Ascites with liver cirrhosis with recent abdominal paracentesis 03/30/2013 fluid cytology negative for lymphoma reactive mesothelial cells present and benign appearing lymphocytes.Has been  referred to GI for further evaluation of ascites.Adviced patient to keep follow up.  (70 Patient has been  referred to Cardiology for evaluation of heart murmur,possible right sided heart failure contributing to ascites.Recent CT chest with cardiomegaly and left sided pleural effusion. ECHO  04/09/2013 indicates Ef 55-60% concentric hypertrophy and valvular problems.   PLAN:  As above  Next follow up   In 3 months with [facility] Discussed plan of care with grandson,Spanish translator present.   Ms. Omana understood the plan of care and she knows to call us with any changes or problems.  Total time spent: 30 minutes   Amada Kingfisher, M.D. Medical oncology     05/01/2013

## 2013-05-03 ENCOUNTER — Other Ambulatory Visit: Payer: Self-pay

## 2013-05-28 ENCOUNTER — Encounter: Payer: Self-pay | Admitting: Cardiovascular Disease

## 2013-05-28 ENCOUNTER — Ambulatory Visit (INDEPENDENT_AMBULATORY_CARE_PROVIDER_SITE_OTHER): Payer: Medicare Other | Admitting: Cardiovascular Disease

## 2013-05-28 VITALS — BP 140/48 | HR 62 | Ht 60.0 in | Wt 121.0 lb

## 2013-05-28 DIAGNOSIS — I4891 Unspecified atrial fibrillation: Secondary | ICD-10-CM

## 2013-05-28 DIAGNOSIS — R011 Cardiac murmur, unspecified: Secondary | ICD-10-CM

## 2013-05-28 MED ORDER — ASPIRIN EC 325 MG PO TBEC: 325.0000 mg | DELAYED_RELEASE_TABLET | Freq: Every day | ORAL | Status: DC

## 2013-05-28 NOTE — Patient Instructions (Signed)
Your physician recommends that you schedule a follow-up appointment in:  About 4 weeks.  Your physician has requested that you have an echocardiogram. Echocardiography is a painless test that uses sound waves to create images of your heart. It provides your doctor with information about the size and shape of your heart and how well your heart's chambers and valves are working. This procedure takes approximately one hour. There are no restrictions for this procedure.   Your physician has recommended you make the following change in your medication:  Start enteric coated aspirin 325 mg by mouth daily.  This is over the counter and can be purchased at the pharmacy

## 2013-05-28 NOTE — Progress Notes (Signed)
History of Present Illness: 73 yo female with history of Non-Hogdkins Lymphoma, hypothyroidism, HTN, DM, ESRD on HD, cirrhosis with recurrent ascites, legal blindness here today for evaluation of cardiac murmur. She had an echo in 2014 with mild MR, moderate TR. She has been on dialysis for over 8 years per family. She does speak english and is here today with her grandson and an interpretor. She has been feeling well. No chest pain or SOB. No LE edema. She does have frequent occurrences of ascites requiring paracenteisis. Her EKG today shows atrial fibrillation which is apparently new for her. I do not see any documentation of this in her records.   Primary Care Physician:  Past Medical History  Diagnosis Date  . Cancer   . ESRD on dialysis     dialysis  . Hypertension   . Diabetes mellitus   . Bladder cancer Jan 10, 2006    BLADDER NECK, BIOPSY: DIFFUSE LARGE B-CELL LYMPHOMA. CT showed worrisome pelvic LN. No treatment records in EPIC.  Marland Kitchen Hyperparathyroidism, secondary     Past Surgical History  Procedure Laterality Date  . Bladder cancer    . Renal failure    . Insertion of dialysis catheter      Current Outpatient Prescriptions  Medication Sig Dispense Refill  . glipiZIDE (GLUCOTROL) 10 MG tablet Take 10 mg by mouth 2 (two) times daily before a meal.      . glyBURIDE (DIABETA) 5 MG tablet       . hydrochlorothiazide (HYDRODIURIL) 12.5 MG tablet       . losartan (COZAAR) 100 MG tablet       . SENSIPAR 60 MG tablet       . sorbitol 70 % solution Take 15 mLs by mouth 2 (two) times daily as needed. For bowel movement  473 mL  0  . labetalol (NORMODYNE) 300 MG tablet Take 200 mg by mouth 2 (two) times daily.       No current facility-administered medications for this visit.    No Known Allergies  History   Social History  . Marital Status: Married    Spouse Name: N/A    Number of Children: N/A  . Years of Education: N/A   Occupational History  . Not on file.    Social History Main Topics  . Smoking status: Never Smoker   . Smokeless tobacco: Never Used  . Alcohol Use: No  . Drug Use: No  . Sexual Activity: Not on file   Other Topics Concern  . Not on file   Social History Narrative  . No narrative on file    Family History  Problem Relation Age of Onset  . Thyroid disease Mother   . Lung cancer Father     Review of Systems:  As stated in the HPI and otherwise negative.   BP 140/48  Pulse 62  Ht 5' (1.524 m)  Wt 121 lb (54.885 kg)  BMI 23.63 kg/m2  Physical Examination: General: Well developed, well nourished, NAD HEENT: OP clear, mucus membranes moist SKIN: warm, dry. No rashes. Neuro: No focal deficits Musculoskeletal: Muscle strength 5/5 all ext Psychiatric: Mood and affect normal Neck: No JVD, no carotid bruits, no thyromegaly, no lymphadenopathy. Lungs:Clear bilaterally, no wheezes, rhonci, crackles Cardiovascular: Irregular irregular. Systolic murmur. No gallops or rubs. Abdomen:Soft. Bowel sounds present. Non-tender.  Extremities: No lower extremity edema. Pulses are 2 + in the bilateral DP/PT.  Echo 04/09/12: Left ventricle: The cavity size was normal. There  was severe concentric hypertrophy. Systolic function was normal. The estimated ejection fraction was in the range of 55% to 60%. Wall motion was normal; there were no regional wall motion abnormalities. - Mitral valve: Moderately calcified annulus. Mild regurgitation. - Left atrium: The atrium was moderately dilated. - Right atrium: The atrium was moderately dilated. - Tricuspid valve: Moderate regurgitation. - Pulmonary arteries: Systolic pressure was moderately to severely increased. PA peak pressure: 25mm Hg (S).  EKG: atrial fib, rate 62 bpm. Non-specific T wave abnormality  Assessment and Plan:   1. Atrial fibrillation: Her atrial fibrillation has not been previously documented. Rate controlled. I have discussed the risk of CVA. She has many  complex medical issues including frequent need for paracenteses for recurrent ascites. She is limited in her mobility and is on dialysis. At this time, will begin ASA 81 mg daily. Will arrange echo to assess LVEF. I will review her records and discuss coumadin at the next visit although she will be difficult to anti-coagulate with need for frequent procedures.   2. Cardiac murmur: Likely related to her known MR, TR. Will repeat echo.

## 2013-06-07 ENCOUNTER — Ambulatory Visit (HOSPITAL_COMMUNITY): Payer: Medicare Other | Attending: Internal Medicine | Admitting: Cardiology

## 2013-06-07 DIAGNOSIS — I4891 Unspecified atrial fibrillation: Secondary | ICD-10-CM | POA: Insufficient documentation

## 2013-06-07 DIAGNOSIS — R011 Cardiac murmur, unspecified: Secondary | ICD-10-CM

## 2013-06-07 DIAGNOSIS — R9431 Abnormal electrocardiogram [ECG] [EKG]: Secondary | ICD-10-CM

## 2013-06-07 NOTE — Progress Notes (Signed)
Echo performed. 

## 2013-06-09 ENCOUNTER — Other Ambulatory Visit: Payer: Medicare Other

## 2013-06-09 ENCOUNTER — Ambulatory Visit: Payer: Medicare Other | Admitting: Oncology

## 2013-06-15 ENCOUNTER — Ambulatory Visit: Payer: Medicare Other | Admitting: Gastroenterology

## 2013-06-16 ENCOUNTER — Ambulatory Visit: Payer: Medicare Other | Admitting: Gastroenterology

## 2013-06-25 ENCOUNTER — Encounter: Payer: Medicare Other | Admitting: Cardiovascular Disease

## 2013-06-25 NOTE — Progress Notes (Signed)
No show

## 2013-07-06 ENCOUNTER — Telehealth: Payer: Self-pay | Admitting: Cardiovascular Disease

## 2013-07-06 NOTE — Telephone Encounter (Signed)
Spoke with pt's grandson who is asking if pt can fly to [place] next month. She saw Dr. Angelena Form in April and had echo. She missed follow up appt with Dr. Angelena Form in May and this has been rescheduled to September 10, 2013. Son reports pt has previously flown to [place] and did fine. He reports she is feeling OK.  Will forward to Dr. Angelena Form for advice.

## 2013-07-06 NOTE — Telephone Encounter (Signed)
New problem   Pt need to know if she can fly to [place]. Please advise

## 2013-07-07 NOTE — Telephone Encounter (Signed)
LMTCB about Dr Angelena Form giving the pt the ok to fly to [place].

## 2013-07-07 NOTE — Telephone Encounter (Signed)
Left message on a confirmed voicemail to [name], that Dr Angelena Form gave his Grandmother the ok to fly to [place].

## 2013-07-07 NOTE — Telephone Encounter (Signed)
Follow Up:  Pt's grandson, Lona Millard, states he is returning a call to [employer] He is requesting a call back.

## 2013-07-07 NOTE — Telephone Encounter (Signed)
OK to fly to [place]. cdm

## 2013-07-27 ENCOUNTER — Other Ambulatory Visit: Payer: Self-pay | Admitting: *Deleted

## 2013-07-27 DIAGNOSIS — C8590 Non-Hodgkin lymphoma, unspecified, unspecified site: Secondary | ICD-10-CM

## 2013-07-27 DIAGNOSIS — R590 Localized enlarged lymph nodes: Secondary | ICD-10-CM

## 2013-07-28 ENCOUNTER — Other Ambulatory Visit: Payer: Medicare Other

## 2013-07-28 ENCOUNTER — Telehealth: Payer: Self-pay | Admitting: *Deleted

## 2013-07-28 ENCOUNTER — Ambulatory Visit: Payer: Medicare Other | Admitting: Oncology

## 2013-07-28 NOTE — Telephone Encounter (Signed)
Called to follow up on patient missing appt today. Son Myna Bright was not aware. Confirmed date and time for 7/22 with Dr Jana Hakim.

## 2013-07-29 ENCOUNTER — Telehealth: Payer: Self-pay | Admitting: Oncology

## 2013-07-29 NOTE — Telephone Encounter (Signed)
Sent letter to patient from Dr. Magrinat. °

## 2013-09-01 ENCOUNTER — Other Ambulatory Visit (HOSPITAL_BASED_OUTPATIENT_CLINIC_OR_DEPARTMENT_OTHER): Payer: Medicare Other

## 2013-09-01 ENCOUNTER — Telehealth: Payer: Self-pay | Admitting: Oncology

## 2013-09-01 ENCOUNTER — Encounter: Payer: Self-pay | Admitting: *Deleted

## 2013-09-01 ENCOUNTER — Ambulatory Visit (HOSPITAL_BASED_OUTPATIENT_CLINIC_OR_DEPARTMENT_OTHER): Payer: Medicare Other | Admitting: Oncology

## 2013-09-01 VITALS — BP 136/46 | HR 51 | Temp 98.3°F | Resp 20 | Ht 60.0 in | Wt 120.3 lb

## 2013-09-01 DIAGNOSIS — C8589 Other specified types of non-Hodgkin lymphoma, extranodal and solid organ sites: Secondary | ICD-10-CM

## 2013-09-01 DIAGNOSIS — C8590 Non-Hodgkin lymphoma, unspecified, unspecified site: Secondary | ICD-10-CM

## 2013-09-01 DIAGNOSIS — R590 Localized enlarged lymph nodes: Secondary | ICD-10-CM

## 2013-09-01 DIAGNOSIS — N186 End stage renal disease: Secondary | ICD-10-CM

## 2013-09-01 LAB — CBC WITH DIFFERENTIAL/PLATELET
BASO%: 1.4 % (ref 0.0–2.0)
BASOS ABS: 0.1 10*3/uL (ref 0.0–0.1)
EOS ABS: 0.2 10*3/uL (ref 0.0–0.5)
EOS%: 4.8 % (ref 0.0–7.0)
HEMATOCRIT: 34.2 % — AB (ref 34.8–46.6)
HEMOGLOBIN: 11.3 g/dL — AB (ref 11.6–15.9)
LYMPH#: 1.2 10*3/uL (ref 0.9–3.3)
LYMPH%: 25.4 % (ref 14.0–49.7)
MCH: 33.3 pg (ref 25.1–34.0)
MCHC: 33.2 g/dL (ref 31.5–36.0)
MCV: 100.4 fL (ref 79.5–101.0)
MONO#: 0.5 10*3/uL (ref 0.1–0.9)
MONO%: 10.1 % (ref 0.0–14.0)
NEUT#: 2.8 10*3/uL (ref 1.5–6.5)
NEUT%: 58.3 % (ref 38.4–76.8)
Platelets: 191 10*3/uL (ref 145–400)
RBC: 3.41 10*6/uL — ABNORMAL LOW (ref 3.70–5.45)
RDW: 13.8 % (ref 11.2–14.5)
WBC: 4.9 10*3/uL (ref 3.9–10.3)

## 2013-09-01 LAB — COMPREHENSIVE METABOLIC PANEL (CC13)
ALT: 15 U/L (ref 0–55)
AST: 18 U/L (ref 5–34)
Albumin: 3.4 g/dL — ABNORMAL LOW (ref 3.5–5.0)
Alkaline Phosphatase: 463 U/L — ABNORMAL HIGH (ref 40–150)
Anion Gap: 16 mEq/L — ABNORMAL HIGH (ref 3–11)
BUN: 38.7 mg/dL — AB (ref 7.0–26.0)
CALCIUM: 10.2 mg/dL (ref 8.4–10.4)
CHLORIDE: 95 meq/L — AB (ref 98–109)
CO2: 28 meq/L (ref 22–29)
CREATININE: 4.9 mg/dL — AB (ref 0.6–1.1)
GLUCOSE: 193 mg/dL — AB (ref 70–140)
Potassium: 4.3 mEq/L (ref 3.5–5.1)
Sodium: 139 mEq/L (ref 136–145)
Total Bilirubin: 0.67 mg/dL (ref 0.20–1.20)
Total Protein: 7.4 g/dL (ref 6.4–8.3)

## 2013-09-01 LAB — LACTATE DEHYDROGENASE (CC13): LDH: 153 U/L (ref 125–245)

## 2013-09-01 NOTE — Progress Notes (Signed)
[place] Work  Clinical Social Work was referred by Futures trader for assessment of psychosocial needs due to blindness.  Clinical Social Worker met with patient at [facility] along with her daughter in-law and interpreter to offer support and assess for needs. Pt reports to have had issues with blindness for the last ten years, but now is requesting assistance with this issue. She has good support from family that are here locally. CSW provided pt and daughter in-law with resources through the Services of the [employer] office in [place]. The social worker there is Gwyndolyn Saxon and the contact is [phone]. They can assess pt further and do a home visit to further assist pt as she adjusts to these changes. Pt and daughter in-law were made aware of this resource and contact information. Pt could also qualify for a handicap placard to use when family drive her to destinations. Both were appreciative of CSW support.    Clinical Social Work interventions: Engineer, drilling education   Loren Racer, [place] Clinical Social Worker [facility] [place] for [place] Wednesday, Thursday and Friday Phone: [phone] Fax: [phone]

## 2013-09-01 NOTE — Progress Notes (Signed)
[name]   DOB: 01-16-1941  MR#: [id number]  CSN#:[id number]  PCP: Kathlene November GYN:  SU: Dr. Armstead Peaks OTHER MD: Lauree Chandler, Roney Jaffe  DIAGNOSIS: Non-Hodgkin's lymphoma  Chief complaint: Follow up visit for lymphoma and  review of PET scans  HISTORY OF PRESENT ILLNESS: As per previously documented note:  "The patient was originally referred in 2007, when she presented with gross hematuria.  Dr. Jeffie Pollock obtained CT urogram, which showed small kidneys bilaterally (the patient has end-stage renal disease) with no obvious stones or renal lesions.  In the bladder, there was a donut-shaped mass at the bladder neck and cystoscopy showed a smooth bladder without papillary lesions but on retroflexion there was a donut-like lesion relatively circumferential at the bladder neck with some bleeding on the posterior aspect.  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  The patient was referred for further evaluation but failed to show (I actually wrote her a letter in [place] with a 2nd appointment but she failed to show for that appointment as well.).   More recently, the patient again developed gross hematuria.  She again went to see Dr. Jeffie Pollock and he again obtained a CT of the abdomen and pelvis without contrast.  Interestingly, some of the abdominal lymph nodes previously noted are now a little bit smaller.  The left ureter, which was questionably or intermittently dilated previously, is now clearly dilated to the level of S3, at which point it is difficult to follow.  There is bladder wall thickening, particularly on the right, and there is an enlarged left groin lymph node measuring 2.5 cm.  Otherwise, the left groin adenopathy is decreased compared to 2007"  Her subsequent history is as detailed below.  INTERVAL HISTORY: [name] returns today with her daughter for followup of the patient's history of lymphoma. The interval  history is generally stable. There have not been any fevers, drenching sweats, unexplained fatigue or unexplained weight loss. As been no rash, pruritus, or adenopathy that the patient is aware of. The patient "does not think all day". She is not able to cook or do much housework because of her blindness. Family is very supportive and they do their best to meet her needs.  REVIEW OF SYSTEMS: She has moderate ascites and this bothers her. She wonders if the fluid could just be removed. Her hernia itself is not causing her any problems. She denies unusual headaches, nausea, vomiting, dizziness, or gait imbalance. Bowel and bladder function is normal for her. She denies pain. She continues on hemodialysis every Tuesday, Thursday and Saturday. A detailed review of systems was otherwise stable  PAST MEDICAL HISTORY: Past Medical History  Diagnosis Date  . Cancer   . ESRD on dialysis     dialysis  . Hypertension   . Diabetes mellitus   . Bladder cancer Jan 10, 2006    BLADDER NECK, BIOPSY: DIFFUSE LARGE B-CELL LYMPHOMA. CT showed worrisome pelvic LN. No treatment records in EPIC.  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  She has a fistula in the left upper extremity placed June of 2007.  Her diabetes, she says, is well controlled.  She is essentially blind secondary to that.  There is also a history of hypothyroidism.  PAST SURGICAL HISTORY:  Past Surgical History  Procedure Laterality Date  . Insertion of dialysis catheter      FAMILY HISTORY Family History  Problem Relation Age of Onset  . Thyroid disease Mother   . Lung cancer Father   The patient's father died at the age of 29 from "lung problems".  The patient's mother died at the age of 24 from pneumonia.  The  patient has 3 sisters and 4 brothers.  She is not aware of a history of cancer in the family.    GYNECOLOGIC HISTORY: She had 7 children.  She does not recall when she went through the change of life.  She did not take hormones.  SOCIAL HISTORY: She was born in [place] in [place].  She has lived in the [place] about 8 years.  Her husband, Luiz Ochoa, is retired from Architect and is present today, as are 2 children, Mudlogger (who works in a [employer]) and Educational psychologist (who works in [employer]).  The patient has more grandchildren than she can count, and innumerable great-grandchildren as well.     ADVANCED DIRECTIVES:  HEALTH MAINTENANCE: History  Substance Use Topics  . Smoking status: Never Smoker   . Smokeless tobacco: Never Used  . Alcohol Use: No     Colonoscopy:  PAP:  Bone density:  Lipid panel:  No Known Allergies  Current Outpatient Prescriptions  Medication Sig Dispense Refill  . aspirin EC 325 MG tablet Take 1 tablet (325 mg total) by mouth daily.  30 tablet  0  . glipiZIDE (GLUCOTROL) 10 MG tablet Take 10 mg by mouth 2 (two) times daily before a meal.      . glyBURIDE (DIABETA) 5 MG tablet       . hydrochlorothiazide (HYDRODIURIL) 12.5 MG tablet       . labetalol (NORMODYNE) 300 MG tablet Take 200 mg by mouth 2 (two) times daily.      Valerie Kitchen losartan (COZAAR) 100 MG tablet       . omeprazole (PRILOSEC) 20 MG capsule       . SENSIPAR 60 MG tablet       . sorbitol 70 % solution Take 15 mLs by mouth 2 (two) times daily as needed. For bowel movement  473 mL  0   No current facility-administered medications for this visit.    OBJECTIVE: Hispanic woman who appears older than stated age 73 Vitals:   09/01/13 1331  BP: 136/46  Pulse: 51  Temp: 98.3 F (36.8 C)  Resp: 20     Body mass index is 23.49 kg/(m^2).    ECOG FS: 1 Filed Weights   09/01/13 1331  Weight: 120 lb 4.8 oz (54.568 kg)   HEENT PERRLA, Sclerae anicteric, Oropharynx clear Lungs no rales or  rhonchi Heart irregular rate and rhythm, 3/6 systolic murmur as previously noted Abdomen: distended, soft, positive bowel sounds, nontender and no masses palpated. Patient has  reducible hernia in the left inguinal area   MSK no focal spinal tenderness, no peripheral edema; upper arm fistula shows a good thrill Neuro: nonfocal, well oriented, pleasant affect Breasts: deferred Nodes: no cervical, supraclavicular or axillary adenopathy   LAB RESULTS: Lab Results  Component Value Date   WBC 4.9 09/01/2013   NEUTROABS 2.8 09/01/2013   HGB 11.3* 09/01/2013   HCT 34.2* 09/01/2013   MCV 100.4 09/01/2013   PLT 191 09/01/2013      Chemistry      Component Value Date/Time   NA 140 04/07/2013 1015  NA 137 04/08/2012 1416   K 3.8 04/07/2013 1015   K 3.6 04/08/2012 1416   CL 97 04/08/2012 1416   CL 97* 10/16/2011 1425   CO2 27 04/07/2013 1015   CO2 28 04/08/2012 1416   BUN 28.0* 04/07/2013 1015   BUN 30* 04/08/2012 1416   CREATININE 4.4* 04/07/2013 1015   CREATININE 4.83* 04/08/2012 1416      Component Value Date/Time   CALCIUM 10.2 04/07/2013 1015   CALCIUM 10.6* 04/08/2012 1416   ALKPHOS 294* 04/07/2013 1015   ALKPHOS 149* 04/08/2012 1416   Jordan 15 04/07/2013 1015   Jordan 20 04/08/2012 1416   ALT 10 04/07/2013 1015   ALT 10 04/08/2012 1416   BILITOT 0.49 04/07/2013 1015   BILITOT 0.4 04/08/2012 1416      STUDIES: CLINICAL DATA: Subsequent treatment strategy for non-Hodgkin's  lymphoma.  EXAM:  NUCLEAR MEDICINE PET SKULL BASE TO THIGH  TECHNIQUE:  8.7 mCi F-18 FDG was injected intravenously. Full-ring PET imaging  was performed from the skull base to thigh after the radiotracer. CT  data was obtained and used for attenuation correction and anatomic  localization.  FASTING BLOOD GLUCOSE: Value: 135 mg/dl  COMPARISON: [place] THORA/PARACENTESIS dated 06/11/2005; CT ABD/PELV WO CM  dated 03/19/2013 are  FINDINGS:  NECK  No hypermetabolic lymph nodes in the neck.  CHEST  No hypermetabolic mediastinal or  hilar nodes. No suspicious  pulmonary nodules on the CT scan.  ABDOMEN/PELVIS  No abnormal hypermetabolic activity within the liver, pancreas,  adrenal glands, or spleen. No hypermetabolic lymph nodes in the  abdomen or pelvis.  Moderate volume of ascitic fluid within the item pelvis similar to  comparison CT.  There is a 5 cm rounded fluid collection in left groin. This likely  represents ascitic fluid communicating through a inguinal hernia.  SKELETON  No focal hypermetabolic activity to suggest skeletal metastasis.  IMPRESSION:  1. No evidence of hypermetabolic lymph nodes in the chest, abdomen,  or pelvis.  2. Benign appearing fluid collection within the right groin relates  to ascites and inguinal hernia.  3. Large volume ascites similar to comparison CT.  Electronically Signed  By: Suzy Bouchard M.D.  On: 04/28/2013 16:39    ASSESSMENT: 73 y.o. 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 As far as her cancer is concerned I am going to see her again in November, with review of systems, physical exam and lab work.  She and her family would greatly benefit if she had a primary care physician was a [place] speaker. I have asked Dr. Bennett Scrape past if he could help out in that capacity and he has very generously  agreed. His office will be contacting the patient for an appointment. I also asked our social worker to provide the family with access to the services to the blind. She also provider the family with a handicap sticker.  The patient and family know to call for any problems that may develop before her next visit here.  Chauncey Cruel, MD       09/01/2013

## 2013-09-01 NOTE — Telephone Encounter (Signed)
per pof to sch pt appt-gave pt copy of sch °

## 2013-09-02 ENCOUNTER — Telehealth: Payer: Self-pay | Admitting: Internal Medicine

## 2013-09-02 NOTE — Telephone Encounter (Signed)
Phone call from Dr. Jana Hakim, the patient needs a new PCP who speaks Spanish. Please call the patient's family , arrange a 30 minute appointment with me within a month. Please used the translation services to communicate with them if needed

## 2013-09-10 ENCOUNTER — Encounter: Payer: Self-pay | Admitting: Cardiovascular Disease

## 2013-09-10 ENCOUNTER — Ambulatory Visit (INDEPENDENT_AMBULATORY_CARE_PROVIDER_SITE_OTHER): Payer: Medicare Other | Admitting: Cardiovascular Disease

## 2013-09-10 VITALS — BP 144/56 | HR 60 | Ht 60.0 in | Wt 115.0 lb

## 2013-09-10 DIAGNOSIS — I340 Nonrheumatic mitral (valve) insufficiency: Secondary | ICD-10-CM

## 2013-09-10 DIAGNOSIS — I059 Rheumatic mitral valve disease, unspecified: Secondary | ICD-10-CM

## 2013-09-10 DIAGNOSIS — I272 Pulmonary hypertension, unspecified: Secondary | ICD-10-CM

## 2013-09-10 DIAGNOSIS — I2789 Other specified pulmonary heart diseases: Secondary | ICD-10-CM

## 2013-09-10 DIAGNOSIS — I480 Paroxysmal atrial fibrillation: Secondary | ICD-10-CM

## 2013-09-10 DIAGNOSIS — I4891 Unspecified atrial fibrillation: Secondary | ICD-10-CM

## 2013-09-10 DIAGNOSIS — I071 Rheumatic tricuspid insufficiency: Secondary | ICD-10-CM

## 2013-09-10 DIAGNOSIS — I079 Rheumatic tricuspid valve disease, unspecified: Secondary | ICD-10-CM

## 2013-09-10 NOTE — Progress Notes (Signed)
History of Present Illness: 73 yo female with history of Non-Hogdkins Lymphoma, hypothyroidism, HTN, DM, ESRD on HD, cirrhosis with recurrent ascites, legal blindness here today for cardiac follow up. She was seen for evaluation of cardiac murmur April 2015. She had an echo in 2014 with mild MR, moderate TR. She has been on dialysis for over 8 years per family. She does not speak english and is here today with her grandson and an interpretor.   She is here today for follow up. She has been feeling well. No chest pain or SOB. No LE edema. She does have frequent occurrences of ascites requiring paracentesis but none since her last visit her. No palpitations or awareness of irregularity of her heart.   Primary Care Physician:  Past Medical History  Diagnosis Date  . Cancer   . ESRD on dialysis     dialysis  . Hypertension   . Diabetes mellitus   . Bladder cancer Jan 10, 2006    BLADDER NECK, BIOPSY: DIFFUSE LARGE B-CELL LYMPHOMA. CT showed worrisome pelvic LN. No treatment records in EPIC.  Marland Kitchen Hyperparathyroidism, secondary     Past Surgical History  Procedure Laterality Date  . Insertion of dialysis catheter      Current Outpatient Prescriptions  Medication Sig Dispense Refill  . aspirin EC 325 MG tablet Take 1 tablet (325 mg total) by mouth daily.  30 tablet  0  . glipiZIDE (GLUCOTROL) 10 MG tablet Take 10 mg by mouth 2 (two) times daily before a meal.      . glyBURIDE (DIABETA) 5 MG tablet       . hydrochlorothiazide (HYDRODIURIL) 12.5 MG tablet       . labetalol (NORMODYNE) 300 MG tablet Take 200 mg by mouth 2 (two) times daily.      Marland Kitchen losartan (COZAAR) 100 MG tablet       . omeprazole (PRILOSEC) 20 MG capsule       . SENSIPAR 60 MG tablet       . sorbitol 70 % solution Take 15 mLs by mouth 2 (two) times daily as needed. For bowel movement  473 mL  0   No current facility-administered medications for this visit.    No Known Allergies  History   Social History  .  Marital Status: Married    Spouse Name: N/A    Number of Children: 8  . Years of Education: N/A   Occupational History  . Disabled    Social History Main Topics  . Smoking status: Never Smoker   . Smokeless tobacco: Never Used  . Alcohol Use: No  . Drug Use: No  . Sexual Activity: Not on file   Other Topics Concern  . Not on file   Social History Narrative  . No narrative on file    Family History  Problem Relation Age of Onset  . Thyroid disease Mother   . Lung cancer Father     Review of Systems:  As stated in the HPI and otherwise negative.   BP 144/56  Pulse 60  Ht 5' (1.524 m)  Wt 115 lb (52.164 kg)  BMI 22.46 kg/m2  Physical Examination: General: Well developed, well nourished, NAD HEENT: OP clear, mucus membranes moist SKIN: warm, dry. No rashes. Neuro: No focal deficits Musculoskeletal: Muscle strength 5/5 all ext Psychiatric: Mood and affect normal Neck: No JVD, no carotid bruits, no thyromegaly, no lymphadenopathy. Lungs:Clear bilaterally, no wheezes, rhonci, crackles Cardiovascular: Irregular irregular. Systolic murmur. No gallops  or rubs. Abdomen:Soft. Bowel sounds present. Non-tender.  Extremities: No lower extremity edema. Pulses are 2 + in the bilateral DP/PT.  Echo 06/07/13: Left ventricle: The cavity size was normal. There was moderate concentric hypertrophy. Systolic function was normal. The estimated ejection fraction was in the range of 60% to 65%. Wall motion was normal; there were no regional wall motion abnormalities. The study is not technically sufficient to allow evaluation of LV diastolic function. - Aortic valve: Trileaflet; mildly calcified leaflets. There was no stenosis. No regurgitation. - Mitral valve: Calcified annulus. Mild regurgitation. - Left atrium: Severely dilated (45 ml/m2). - Right atrium: Severely dilated (27 cm2). - Atrial septum: No defect or patent foramen ovale was identified. The IAS bows from right to  left. - Tricuspid valve: Moderate to severe regurgitation. - Pulmonary arteries: PA peak pressure: 29mm Hg (S). - Inferior vena cava: The vessel was normal in size; the respirophasic diameter changes were in the normal range (= 50%); findings are consistent with normal central venous pressure. - Pericardium, extracardiac: There was no pericardial effusion.  Assessment and Plan:   1. Atrial fibrillation: Newly documented at her visit April 2015. Appears to be in sinus today. She is limited in her mobility and is on dialysis. Will continue ASA 325 mg daily. I have discussed the indications for anti-coagulation given atrial fibrillation. Her CHADSVASC score is 4.  She is not a good candidate for coumadin with blindness/frequent need for paracenteses/hemodialysis. Not a candidate for novel anti-coagulant with ESRD. The pt and family understand the risk of CVA   2. Mitral regurgitation: Mild by echo April 2015.   3. Tricuspid regurgitation: Moderate to severe by echo April 2015.   4. Pulmonary HTN:  Estimated PA pressure 67 mm Hg by echo April 2015. No changes since echo 2014. I will not plan invasive evaluation given her multiple comorbidities and poor prognosis.   5. ESRD: on HD.   6. Non-Hodgkins Lymphoma: Currently in remission  7. HTN: BP controlled. No changes.

## 2013-09-10 NOTE — Patient Instructions (Signed)
Your physician wants you to follow-up in:  6 months. You will receive a reminder letter in the mail two months in advance. If you don't receive a letter, please call our office to schedule the follow-up appointment.   

## 2013-10-11 NOTE — Telephone Encounter (Signed)
Pt has appt scheduled for 11/22/2013 at 10:45 am

## 2013-10-11 NOTE — Telephone Encounter (Signed)
Please arrange a OV with me , see below

## 2013-11-22 ENCOUNTER — Ambulatory Visit (INDEPENDENT_AMBULATORY_CARE_PROVIDER_SITE_OTHER): Payer: Medicare Other | Admitting: Internal Medicine

## 2013-11-22 ENCOUNTER — Encounter: Payer: Self-pay | Admitting: Internal Medicine

## 2013-11-22 VITALS — BP 143/57 | HR 55 | Temp 98.1°F | Wt 124.1 lb

## 2013-11-22 DIAGNOSIS — K7460 Unspecified cirrhosis of liver: Secondary | ICD-10-CM | POA: Diagnosis not present

## 2013-11-22 DIAGNOSIS — I482 Chronic atrial fibrillation, unspecified: Secondary | ICD-10-CM

## 2013-11-22 DIAGNOSIS — I10 Essential (primary) hypertension: Secondary | ICD-10-CM

## 2013-11-22 DIAGNOSIS — I4891 Unspecified atrial fibrillation: Secondary | ICD-10-CM | POA: Insufficient documentation

## 2013-11-22 DIAGNOSIS — E11319 Type 2 diabetes mellitus with unspecified diabetic retinopathy without macular edema: Secondary | ICD-10-CM

## 2013-11-22 LAB — HEPATIC FUNCTION PANEL
ALT: 11 U/L (ref 0–35)
AST: 19 U/L (ref 0–37)
Albumin: 3.2 g/dL — ABNORMAL LOW (ref 3.5–5.2)
Alkaline Phosphatase: 399 U/L — ABNORMAL HIGH (ref 39–117)
BILIRUBIN DIRECT: 0.1 mg/dL (ref 0.0–0.3)
BILIRUBIN TOTAL: 0.6 mg/dL (ref 0.2–1.2)
Total Protein: 7.6 g/dL (ref 6.0–8.3)

## 2013-11-22 LAB — HEMOGLOBIN A1C: Hgb A1c MFr Bld: 8.5 % — ABNORMAL HIGH (ref 4.6–6.5)

## 2013-11-22 NOTE — Assessment & Plan Note (Signed)
History of cirrhosis and ascites, had abdominal paracenteses 03/30/2013, cytology negative for lymphoma.

## 2013-11-22 NOTE — Patient Instructions (Signed)
Get your blood work before you leave    Please come back to the office in 3 months  for a routine check up   Come back fasting    

## 2013-11-22 NOTE — Assessment & Plan Note (Signed)
Rate control, on aspirin only, see note from last cardiology visit:  1. Atrial fibrillation: Newly documented at her visit April 2015. Appears to be in sinus today. She is limited in her mobility and is on dialysis. Will continue ASA 325 mg daily. I have discussed the indications for anti-coagulation given atrial fibrillation. Her CHADSVASC score is 4. She is not a good candidate for coumadin with blindness/frequent need for paracenteses/hemodialysis. Not a candidate for novel anti-coagulant with ESRD. The pt and family understand the risk of CVA

## 2013-11-22 NOTE — Progress Notes (Signed)
Pre visit review using our clinic review tool, if applicable. No additional management support is needed unless otherwise documented below in the visit note. 

## 2013-11-22 NOTE — Assessment & Plan Note (Signed)
Seems well controlled  

## 2013-11-22 NOTE — Assessment & Plan Note (Addendum)
Currently on glyburide only. Diabetes complicated by blindness and ESRD. goal  is not tight diabetes control, check a A1c, avoid hypoglycemia

## 2013-11-22 NOTE — Progress Notes (Signed)
Subjective:    Patient ID: [name], female    DOB: April 18, 1940, 73 y.o.   MRN: [id number]  DOS:  11/22/2013 Type of visit - description : To get established, here with her daughter-in-law and grandson.   Note from cardiology and oncology reviewed She has a history of diabetes, blood sugars range from the 130s to 200. No low blood sugars in long time Meds are reviewed, good compliance. History of constipation, on sorbitol as needed.   ROS  Denies chest pain or difficulty breathing No  nausea, vomiting, diarrhea blood in the stools. No anxiety or depression.  Past Medical History  Diagnosis Date  . Non-Hodgkin lymphoma 2007  . ESRD on dialysis     dialysis  . Hypertension   . Diabetes mellitus   . Bladder cancer Jan 10, 2006    BLADDER NECK, BIOPSY: DIFFUSE LARGE B-CELL LYMPHOMA. CT showed worrisome pelvic LN. No treatment records in EPIC.  Marland Kitchen Hyperparathyroidism, secondary   . Atrial fibrillation     on ASA  . Pulmonary HTN   . Blind 2005    Past Surgical History  Procedure Laterality Date  . Insertion of dialysis catheter      History   Social History  . Marital Status: Married    Spouse Name: N/A    Number of Children: 8  . Years of Education: N/A   Occupational History  . Disabled    Social History Main Topics  . Smoking status: Never Smoker   . Smokeless tobacco: Never Used  . Alcohol Use: No  . Drug Use: No  . Sexual Activity: Not on file   Other Topics Concern  . Not on file   Social History Narrative   Original from [place]    In [place] since ~ 2006   household -- husband      Family History  Problem Relation Age of Onset  . Thyroid disease Mother   . Lung cancer Father   . Colon cancer Neg Hx   . Breast cancer Neg Hx        Medication List       This list is accurate as of: 11/22/13  5:25 PM.  Always use your most recent med list.               aspirin EC 325 MG tablet  Take 1 tablet (325 mg total) by mouth daily.       glyBURIDE 5 MG tablet  Commonly known as:  DIABETA     omeprazole 20 MG capsule  Commonly known as:  PRILOSEC     SENSIPAR 60 MG tablet  Generic drug:  cinacalcet     sorbitol 70 % solution  Take 15 mLs by mouth 2 (two) times daily as needed. For bowel movement           Objective:   Physical Exam BP 143/57  Pulse 55  Temp(Src) 98.1 F (36.7 C) (Oral)  Wt 124 lb 2 oz (56.303 kg)  SpO2 96% General -- alert, well-developed, NAD.  HEENT-- Not pale. Lungs -- normal respiratory effort, no intercostal retractions, no accessory muscle use, and normal breath sounds.  Heart-- irreg , + syst  murmur.  Abdomen-- slt distended, good bowel sounds,soft, non-tender. Exam is limited, patient sitting in a wheelchair. Extremities-- trace  pretibial edema bilaterally  Neurologic--  alert & oriented X3.  Psych-- Cognition and judgment appear intact. Cooperative with normal attention span and concentration. No anxious or depressed appearing.  Assessment & Plan:   Constipation, on sorbitol as needed  Had a flu shot already

## 2013-11-29 ENCOUNTER — Telehealth: Payer: Self-pay | Admitting: Internal Medicine

## 2013-11-29 NOTE — Telephone Encounter (Signed)
Tried speaking with Pts daughter in law, she asked if I spoke  Spanish which I don't, Dr. Larose Kells recommended they come into office to speak with Dr. Larose Kells about Pts medications.

## 2013-11-29 NOTE — Telephone Encounter (Signed)
Caller name:  Dyann Ruddle Relation to pt: daughter in law Call back number: [phone]   Reason for call:  Pt would like a return call in regards to labs

## 2013-12-01 ENCOUNTER — Encounter: Payer: Self-pay | Admitting: Internal Medicine

## 2013-12-01 ENCOUNTER — Ambulatory Visit (INDEPENDENT_AMBULATORY_CARE_PROVIDER_SITE_OTHER): Payer: Medicare Other | Admitting: Internal Medicine

## 2013-12-01 VITALS — BP 122/61 | HR 54 | Temp 97.9°F | Wt 124.1 lb

## 2013-12-01 DIAGNOSIS — K7460 Unspecified cirrhosis of liver: Secondary | ICD-10-CM

## 2013-12-01 DIAGNOSIS — E11319 Type 2 diabetes mellitus with unspecified diabetic retinopathy without macular edema: Secondary | ICD-10-CM

## 2013-12-01 NOTE — Progress Notes (Signed)
   Subjective:    Patient ID: [name], female    DOB: 1940/09/28, 73 y.o.   MRN: [id number]  DOS:  12/01/2013 Type of visit - description : f/u Interval history: Here with 2 family member to discuss recent labs. They're mostly concerned about diabetes and cirrhosis. She has ascites, mild abdominal discomfort which is a stable. Also having heartburn, a feeling of regurgitation. Taking Prilosec 20 mg one tablet daily, not necessarily before breakfast.    ROS  Denies fever chills No nausea or vomiting  Past Medical History  Diagnosis Date  . Non-Hodgkin lymphoma 2007  . ESRD on dialysis     dialysis  . Hypertension   . Diabetes mellitus   . Bladder cancer Jan 10, 2006    BLADDER NECK, BIOPSY: DIFFUSE LARGE B-CELL LYMPHOMA. CT showed worrisome pelvic LN. No treatment records in EPIC.  Marland Kitchen Hyperparathyroidism, secondary   . Atrial fibrillation     on ASA  . Pulmonary HTN   . Blind 2005    Past Surgical History  Procedure Laterality Date  . Insertion of dialysis catheter      History   Social History  . Marital Status: Married    Spouse Name: N/A    Number of Children: 8  . Years of Education: N/A   Occupational History  . Disabled    Social History Main Topics  . Smoking status: Never Smoker   . Smokeless tobacco: Never Used  . Alcohol Use: No  . Drug Use: No  . Sexual Activity: Not on file   Other Topics Concern  . Not on file   Social History Narrative   Original from [place]    In [place] since ~ 2006   household -- husband         Medication List       This list is accurate as of: 12/01/13 11:59 PM.  Always use your most recent med list.               aspirin EC 325 MG tablet  Take 1 tablet (325 mg total) by mouth daily.     glyBURIDE 5 MG tablet  Commonly known as:  DIABETA     omeprazole 20 MG capsule  Commonly known as:  PRILOSEC  Take 40 mg by mouth daily.     SENSIPAR 60 MG tablet  Generic drug:  cinacalcet     sorbitol 70 % solution  Take 15 mLs by mouth 2 (two) times daily as needed. For bowel movement           Objective:   Physical Exam BP 122/61  Pulse 54  Temp(Src) 97.9 F (36.6 C) (Oral)  Wt 124 lb 1 oz (56.274 kg)  SpO2 97%  General -- alert, well-developed, NAD.    Abdomen--  Patient his CT he clearly has ascites, + bowel sounds, not tenderness or rebound Extremities-- no pretibial edema bilaterally  Neurologic--  alert & oriented X3. Speech normal  Psych-- Cognition and judgment appear intact. Cooperative with normal attention span and concentration. No anxious or depressed appearing.       Assessment & Plan:   Today , I spent more than 30 min with the patient: >50% of the time counseling regards cirrhosis (what that is, potential complications including encephalopathy and SBP) also discussed diabetes, the need to get it is slightly better control but also the risk of hypoglycemia.

## 2013-12-01 NOTE — Patient Instructions (Signed)
Get your blood work before you leave   Increase Prilosec to 2 tablets daily before breakfast

## 2013-12-01 NOTE — Progress Notes (Signed)
Pre visit review using our clinic review tool, if applicable. No additional management support is needed unless otherwise documented below in the visit note. 

## 2013-12-02 ENCOUNTER — Encounter: Payer: Self-pay | Admitting: Internal Medicine

## 2013-12-02 ENCOUNTER — Telehealth: Payer: Self-pay | Admitting: *Deleted

## 2013-12-02 LAB — CBC WITH DIFFERENTIAL/PLATELET
Basophils Absolute: 0 10*3/uL (ref 0.0–0.1)
Basophils Relative: 0.4 % (ref 0.0–3.0)
EOS PCT: 4.4 % (ref 0.0–5.0)
Eosinophils Absolute: 0.2 10*3/uL (ref 0.0–0.7)
HEMATOCRIT: 35.6 % — AB (ref 36.0–46.0)
Hemoglobin: 11.7 g/dL — ABNORMAL LOW (ref 12.0–15.0)
LYMPHS ABS: 1.3 10*3/uL (ref 0.7–4.0)
Lymphocytes Relative: 26.9 % (ref 12.0–46.0)
MCHC: 32.9 g/dL (ref 30.0–36.0)
MCV: 101.8 fl — AB (ref 78.0–100.0)
MONO ABS: 0.4 10*3/uL (ref 0.1–1.0)
Monocytes Relative: 8.8 % (ref 3.0–12.0)
Neutro Abs: 2.9 10*3/uL (ref 1.4–7.7)
Neutrophils Relative %: 59.5 % (ref 43.0–77.0)
Platelets: 183 10*3/uL (ref 150.0–400.0)
RBC: 3.49 Mil/uL — ABNORMAL LOW (ref 3.87–5.11)
RDW: 14.4 % (ref 11.5–15.5)
WBC: 4.9 10*3/uL (ref 4.0–10.5)

## 2013-12-02 LAB — PROTIME-INR

## 2013-12-02 LAB — APTT: aPTT: >120 s (ref 23.4–32.7)

## 2013-12-02 LAB — HEPATITIS B SURFACE ANTIGEN: HEP B S AG: NEGATIVE

## 2013-12-02 LAB — HEPATITIS B CORE ANTIBODY, TOTAL: Hep B Core Total Ab: NONREACTIVE

## 2013-12-02 NOTE — Assessment & Plan Note (Signed)
Cirrhosis, The patient has a history of cirrhosis, she was first diagnosed ~ a year ago. She had a negative hepatitis C serology, no history of alcohol CT of the abdomen 03-2013 confirm ascites and a nodular liver. LFTs: Chronically elevated alkaline phosphate, usually AST ALT are normal. We had a long discussion about the diagnoses,  Don't think diuretics will help, she has Hemodialisis x 3/week Continue with sorbitol to prevent constipation and encephalopathy Monitor her symptoms such as pain and fever for a spontaneous bacterial peritonitis. Will refer to GI, she may benefit from occasionally paracentesis. Check a PT, PTT, hepatitis B serology and a CBC looking for thrombocytopenia.

## 2013-12-02 NOTE — Assessment & Plan Note (Signed)
Diabetes, A1c 8.5, I would like to see her A1c below 8, the patient refuses to take insulin. With are  not looking at a tight  control of her diabetes, she lives w/ her elderly husband and hypoglycemia could be a real problem consequently will not insist on insulin.

## 2013-12-02 NOTE — Telephone Encounter (Signed)
Received call from [facility] lab with critical lab results:PT/PTT=greater than 120, and INR=greater than 10.   Unable to report out because the reading was to high.  Critical results was given to Dr. Rolla Flatten

## 2014-01-10 ENCOUNTER — Other Ambulatory Visit: Payer: BC Managed Care – PPO

## 2014-01-10 ENCOUNTER — Ambulatory Visit: Payer: BC Managed Care – PPO | Admitting: Oncology

## 2014-01-11 NOTE — Progress Notes (Signed)
No show

## 2014-01-12 ENCOUNTER — Telehealth: Payer: Self-pay | Admitting: Internal Medicine

## 2014-01-12 NOTE — Telephone Encounter (Signed)
Ok for refill x 1 

## 2014-01-12 NOTE — Telephone Encounter (Signed)
Sorbitol sent to [facility].

## 2014-01-12 NOTE — Telephone Encounter (Signed)
Pt is requesting refill on Sorbitol 70 %, last refill was 05/06/2011 #473 mL, new Pt with Dr. Larose Kells on 11/22/2013. Please advise.

## 2014-01-14 ENCOUNTER — Other Ambulatory Visit: Payer: Self-pay

## 2014-01-27 ENCOUNTER — Ambulatory Visit (INDEPENDENT_AMBULATORY_CARE_PROVIDER_SITE_OTHER): Payer: Medicare Other | Admitting: Medical

## 2014-01-27 ENCOUNTER — Telehealth: Payer: Self-pay | Admitting: Medical

## 2014-01-27 VITALS — BP 147/68 | HR 62 | Temp 98.1°F | Wt 117.8 lb

## 2014-01-27 DIAGNOSIS — K5900 Constipation, unspecified: Secondary | ICD-10-CM | POA: Insufficient documentation

## 2014-01-27 DIAGNOSIS — R1012 Left upper quadrant pain: Secondary | ICD-10-CM

## 2014-01-27 DIAGNOSIS — K5909 Other constipation: Secondary | ICD-10-CM

## 2014-01-27 DIAGNOSIS — K7460 Unspecified cirrhosis of liver: Secondary | ICD-10-CM

## 2014-01-27 LAB — CBC WITH DIFFERENTIAL/PLATELET
BASOS PCT: 0.7 % (ref 0.0–3.0)
Basophils Absolute: 0 10*3/uL (ref 0.0–0.1)
EOS PCT: 5.2 % — AB (ref 0.0–5.0)
Eosinophils Absolute: 0.2 10*3/uL (ref 0.0–0.7)
HCT: 33.1 % — ABNORMAL LOW (ref 36.0–46.0)
Hemoglobin: 10.9 g/dL — ABNORMAL LOW (ref 12.0–15.0)
LYMPHS PCT: 30.8 % (ref 12.0–46.0)
Lymphs Abs: 1.5 10*3/uL (ref 0.7–4.0)
MCHC: 33 g/dL (ref 30.0–36.0)
MCV: 100.8 fl — ABNORMAL HIGH (ref 78.0–100.0)
MONO ABS: 0.5 10*3/uL (ref 0.1–1.0)
MONOS PCT: 10.2 % (ref 3.0–12.0)
Neutro Abs: 2.5 10*3/uL (ref 1.4–7.7)
Neutrophils Relative %: 53.1 % (ref 43.0–77.0)
Platelets: 187 10*3/uL (ref 150.0–400.0)
RBC: 3.28 Mil/uL — AB (ref 3.87–5.11)
RDW: 14.9 % (ref 11.5–15.5)
WBC: 4.7 10*3/uL (ref 4.0–10.5)

## 2014-01-27 LAB — COMPREHENSIVE METABOLIC PANEL
ALBUMIN: 3.3 g/dL — AB (ref 3.5–5.2)
ALK PHOS: 344 U/L — AB (ref 39–117)
ALT: 10 U/L (ref 0–35)
AST: 15 U/L (ref 0–37)
BUN: 38 mg/dL — AB (ref 6–23)
CALCIUM: 9.7 mg/dL (ref 8.4–10.5)
CHLORIDE: 95 meq/L — AB (ref 96–112)
CO2: 28 meq/L (ref 19–32)
Creatinine, Ser: 5.4 mg/dL (ref 0.4–1.2)
GFR: 8.31 mL/min — CL (ref >60.00)
GLUCOSE: 198 mg/dL — AB (ref 70–99)
POTASSIUM: 5 meq/L (ref 3.5–5.1)
SODIUM: 137 meq/L (ref 135–145)
TOTAL PROTEIN: 6.8 g/dL (ref 6.0–8.3)
Total Bilirubin: 0.6 mg/dL (ref 0.2–1.2)

## 2014-01-27 NOTE — Progress Notes (Signed)
Subjective:    Patient ID: [name], female    DOB: [birth year], 73 y.o.   MRN: [id number]  HPI   Pt in with pain in luq pain for 8 days. Pt states pain is severe at times but minimal presently during the interview. Pt went to dialysis Monday. No nauseu, no fever, no vomiting.   No history of pain in this area before past 8 days. Pt does have history of constipation. At first yesterday hard stool. Then yesterday very loose watery stool after taking otc med to help with constipation. This bm did not stop pain but decreased som. After burping felt better and feels like has air inside. Pt has history of constipation for 3 days then has to take something by 3rd day to have a bm.  Pt states some fluid pulled  off of abdomen in the pas(due to ascites)t. Pt has hx of cirrhosis.  Before yesterday loose stools after  3 days with no loose stools. At first had small hard bm then followed by watery description  No history of abdominal surgeries in the past.  Pain at present is very minimal.   Past Medical History  Diagnosis Date  . Non-Hodgkin lymphoma 2007  . ESRD on dialysis     dialysis  . Hypertension   . Diabetes mellitus   . Bladder cancer Jan 10, 2006    BLADDER NECK, BIOPSY: DIFFUSE LARGE B-CELL LYMPHOMA. CT showed worrisome pelvic LN. No treatment records in EPIC.  Marland Kitchen Hyperparathyroidism, secondary   . Atrial fibrillation     on ASA  . Pulmonary HTN   . Blind 2005    History   Social History  . Marital Status: Married    Spouse Name: N/A    Number of Children: 8  . Years of Education: N/A   Occupational History  . Disabled    Social History Main Topics  . Smoking status: Never Smoker   . Smokeless tobacco: Never Used  . Alcohol Use: No  . Drug Use: No  . Sexual Activity: Not on file   Other Topics Concern  . Not on file   Social History Narrative   Original from [place]    In [place] since ~ 2006   household -- husband       Communicate with  daughter-in-law Murray Hodgkins [phone]    Past Surgical History  Procedure Laterality Date  . Insertion of dialysis catheter      Family History  Problem Relation Age of Onset  . Thyroid disease Mother   . Lung cancer Father   . Colon cancer Neg Hx   . Breast cancer Neg Hx     No Known Allergies  Current Outpatient Prescriptions on File Prior to Visit  Medication Sig Dispense Refill  . omeprazole (PRILOSEC) 20 MG capsule Take 40 mg by mouth daily.     . SENSIPAR 60 MG tablet     . sorbitol 70 % solution take 30 milliliters by mouth once daily if needed for constipation 300 mL 0   No current facility-administered medications on file prior to visit.    BP 147/68 mmHg  Pulse 62  Temp(Src) 98.1 F (36.7 C) (Oral)  Wt 117 lb 12.8 oz (53.434 kg)  SpO2 97%     Review of Systems  Constitutional: Negative for fever, chills and fatigue.  Respiratory: Negative for cough, chest tightness, shortness of breath and wheezing.   Cardiovascular: Negative for chest pain and palpitations.  Gastrointestinal: Positive for abdominal  pain, diarrhea and constipation. Negative for nausea, vomiting, blood in stool, anal bleeding and rectal pain.       Constipation first then loose stools. But still feels bloated.  Genitourinary: Negative.   Musculoskeletal: Negative for back pain.  Neurological: Negative for dizziness, syncope, speech difficulty, weakness, light-headedness, numbness and headaches.  Hematological: Negative for adenopathy. Does not bruise/bleed easily.       Objective:   Physical Exam   General Appearance- Not in acute distress.  HEENT Eyes- Scleraeral/Conjuntiva-bilat- Not Yellow. Mouth & Throat- Normal.  Chest and Lung Exam Auscultation: Breath sounds:-Normal. Adventitious sounds:- No Adventitious sounds.  Cardiovascular Auscultation:Rythm - Regular. Heart Sounds -Normal heart sounds.  Abdomen Inspection:-Inspection Normal.  Palpation/Perucssion: Palpation and  Percussion of the abdomen reveal- Non Tender(maybe mild bloated), No Rebound tenderness, No rigidity(Guarding) and No Palpable abdominal masses. + bs. Liver:-Normal.  Spleen:- Normal.           Assessment & Plan:

## 2014-01-27 NOTE — Assessment & Plan Note (Signed)
For your constipation with very watery loose stools, I want you to get ct of abdomen and pelvis with oral contrast. And wait downstairs until we discuss the results by phone. With test can evaluate if bowel obstruction, presence of stool and assess for possible ascites.  I do also want to get labs today stat cbc and cmp.  If studies show some stool but no obstuction then I want pt and family to ask if pt can use miralax in light of renal failure history/dialysis.

## 2014-01-27 NOTE — Telephone Encounter (Signed)
I called downstairs to radiology asking about the ct results. The last message I got was they needed to go to dialysis. I ok'd them to get ct study done and then go to dialysis. I would not make them wait for the results. I would call them later and I  got call back number. Later in the day when I checked the result was not available and study was not done?? I called pt call back number and left lengthy message asking why they did not get study done and that the study is very important. I asked them to call me in the morning and gave our office number. Will ask my nurse to call pt tomorrow and get pt or family on the line.

## 2014-01-27 NOTE — Patient Instructions (Addendum)
For your constipation with very watery loose stools, I want you to get ct of abdomen and pelvis with oral contrast. And wait downstairs until we discuss the results by phone. With test can evaluate if bowel obstruction, presence of stool and assess for possible ascites.  I do also want to get labs today stat cbc and cmp.  If studies show some stool but no obstuction then I want pt and family to ask if pt can use miralax in light of renal failure history/dialysis.  Follow up date depending on lab results.  Pt needed to leave before stat results back. She needed to go to dialysis. i did not want to delay test for later so she got the scan and then went to dialysis. I will call back family with results of test.

## 2014-01-27 NOTE — Progress Notes (Signed)
Pre visit review using our clinic review tool, if applicable. No additional management support is needed unless otherwise documented below in the visit note. 

## 2014-01-27 NOTE — Assessment & Plan Note (Signed)
With  Ct of abdomen/pelvis can evaluate if bowel obstruction, presence of stool and assess for possible ascites.  I do also want to get labs today stat cbc and cmp.  Chose CT since some constipation and I wanted to assess degree of ascites as well if possible bowel obstruction.

## 2014-01-28 ENCOUNTER — Other Ambulatory Visit (HOSPITAL_BASED_OUTPATIENT_CLINIC_OR_DEPARTMENT_OTHER): Payer: BC Managed Care – PPO

## 2014-01-28 ENCOUNTER — Ambulatory Visit (HOSPITAL_BASED_OUTPATIENT_CLINIC_OR_DEPARTMENT_OTHER)
Admission: RE | Admit: 2014-01-28 | Discharge: 2014-01-28 | Disposition: A | Discharge status: Home / Self Care | Payer: Medicare Other | Source: Ambulatory Visit | Attending: Medical | Admitting: Medical

## 2014-01-28 ENCOUNTER — Telehealth: Payer: Self-pay | Admitting: Internal Medicine

## 2014-01-28 ENCOUNTER — Telehealth: Payer: Self-pay | Admitting: Medical

## 2014-01-28 DIAGNOSIS — N2581 Secondary hyperparathyroidism of renal origin: Secondary | ICD-10-CM | POA: Insufficient documentation

## 2014-01-28 DIAGNOSIS — R1012 Left upper quadrant pain: Secondary | ICD-10-CM | POA: Diagnosis present

## 2014-01-28 DIAGNOSIS — R188 Other ascites: Secondary | ICD-10-CM | POA: Insufficient documentation

## 2014-01-28 DIAGNOSIS — I700 Atherosclerosis of aorta: Secondary | ICD-10-CM | POA: Diagnosis not present

## 2014-01-28 DIAGNOSIS — I517 Cardiomegaly: Secondary | ICD-10-CM | POA: Insufficient documentation

## 2014-01-28 DIAGNOSIS — N186 End stage renal disease: Secondary | ICD-10-CM | POA: Diagnosis not present

## 2014-01-28 DIAGNOSIS — K5900 Constipation, unspecified: Secondary | ICD-10-CM

## 2014-01-28 DIAGNOSIS — K7460 Unspecified cirrhosis of liver: Secondary | ICD-10-CM | POA: Diagnosis not present

## 2014-01-28 DIAGNOSIS — I2510 Atherosclerotic heart disease of native coronary artery without angina pectoris: Secondary | ICD-10-CM | POA: Diagnosis not present

## 2014-01-28 DIAGNOSIS — Z992 Dependence on renal dialysis: Secondary | ICD-10-CM | POA: Diagnosis not present

## 2014-01-28 NOTE — Telephone Encounter (Signed)
I called today again in the morning. They left yesterday because they wanted to get to her dialysis appointment. So I got them to get test done at 2 pm today. Advised them to stay in radiology today until I get back the results. Grandson want her to have pain meds stating she is worse. I explained we need test results first and I may send her to the ED.

## 2014-01-28 NOTE — Telephone Encounter (Signed)
Per Es he has talked to patient family. Will be speaking with them again.

## 2014-01-28 NOTE — Telephone Encounter (Signed)
Pt had more pain today than yesterday. After reviewing ct of abdomen /pelvis and discussing case with Dr. Larose Kells pt pcp advsied to go to [facility] main ED today. [place] office staff advised pt and family in [place]. Copy of CT report given to the pt.

## 2014-01-28 NOTE — Telephone Encounter (Signed)
Caller name: alfonso Relation to pt: grandson Call back number: [phone] Pharmacy:  Reason for call:   Patient granddaughter would like Percell Miller to call her regarding the appointment for today.

## 2014-03-10 ENCOUNTER — Encounter: Payer: BC Managed Care – PPO | Admitting: Internal Medicine

## 2014-04-04 ENCOUNTER — Encounter: Payer: Medicare Other | Admitting: Internal Medicine

## 2014-04-13 ENCOUNTER — Ambulatory Visit: Payer: Medicare Other | Admitting: Cardiovascular Disease

## 2014-04-13 ENCOUNTER — Encounter: Payer: Medicare Other | Admitting: Cardiovascular Disease

## 2014-04-13 NOTE — Progress Notes (Signed)
Cancel.

## 2014-06-18 IMAGING — PT NM PET TUM IMG RESTAG (PS) SKULL BASE T - THIGH
1 of 7 series · 1 of 25 positions shown · non-contrast
Comparison: US MEACHEN/PARACENTESIS dated 06/11/2005; CT ABD/PELV WO CM
dated 03/19/2013 are

CLINICAL DATA: Subsequent treatment strategy for non-Hodgkin's
lymphoma.

EXAM:
NUCLEAR MEDICINE PET SKULL BASE TO THIGH
TECHNIQUE: 8.7 mCi F-18 FDG was injected intravenously. Full-ring PET imaging
was performed from the skull base to thigh after the radiotracer. CT
data was obtained and used for attenuation correction and anatomic
localization.
FASTING BLOOD GLUCOSE:  Value: 135 mg/dl

[Series 4: ct sk_thigh 5.0 b31f · axial · 5.0mm · 0.91mm/px · 1 of 208 slices shown]
[im 208/208  brain]
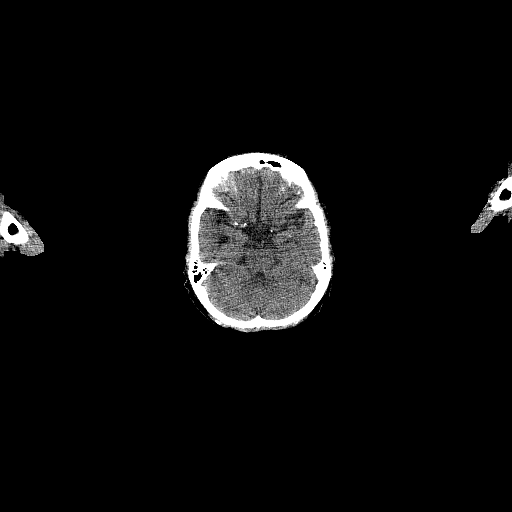

[1 of 25 positions shown; findings below may reference images not displayed]

FINDINGS: NECK

No hypermetabolic lymph nodes in the neck.

CHEST

No hypermetabolic mediastinal or hilar nodes. No suspicious
pulmonary nodules on the CT scan.

ABDOMEN/PELVIS

No abnormal hypermetabolic activity within the liver, pancreas,
adrenal glands, or spleen. No hypermetabolic lymph nodes in the
abdomen or pelvis.

Moderate volume of ascitic fluid within the item pelvis similar to
comparison CT.

There is a 5 cm rounded fluid collection in left groin. This likely
represents ascitic fluid communicating through a inguinal hernia.

SKELETON

No focal hypermetabolic activity to suggest skeletal metastasis.
IMPRESSION: 1. No evidence of hypermetabolic lymph nodes in the chest, abdomen,
or pelvis.
2. Benign appearing fluid collection within the right groin relates
to ascites and inguinal hernia.
3. Large volume ascites similar to comparison CT.

## 2014-08-05 ENCOUNTER — Ambulatory Visit: Payer: Medicare Other | Admitting: Cardiovascular Disease

## 2014-09-19 ENCOUNTER — Ambulatory Visit: Payer: Medicare Other | Admitting: Physician Assistant

## 2015-01-24 ENCOUNTER — Telehealth: Payer: Self-pay | Admitting: Behavioral Health

## 2015-01-24 NOTE — Telephone Encounter (Signed)
The following care gaps were identified: A1C BP Microalbumin Colonoscopy Flu Shot  Yearly physical has been scheduled for 03/06/15 at 10:00 AM with PCP.

## 2015-02-02 ENCOUNTER — Telehealth: Payer: Self-pay | Admitting: Nurse Practitioner

## 2015-02-02 ENCOUNTER — Other Ambulatory Visit: Payer: Self-pay | Admitting: Oncology

## 2015-02-02 DIAGNOSIS — C8299 Follicular lymphoma, unspecified, extranodal and solid organ sites: Secondary | ICD-10-CM

## 2015-02-02 NOTE — Telephone Encounter (Signed)
Spoke with patients son and he is aware of the added appointment 12/23

## 2015-02-03 ENCOUNTER — Ambulatory Visit (HOSPITAL_BASED_OUTPATIENT_CLINIC_OR_DEPARTMENT_OTHER): Payer: Medicare Other | Admitting: Nurse Practitioner

## 2015-02-03 ENCOUNTER — Ambulatory Visit
Admission: RE | Admit: 2015-02-03 | Discharge: 2015-02-03 | Disposition: A | Discharge status: Home / Self Care | Payer: Medicare Other | Source: Ambulatory Visit | Attending: Nurse Practitioner | Admitting: Nurse Practitioner

## 2015-02-03 ENCOUNTER — Telehealth: Payer: Self-pay | Admitting: Nurse Practitioner

## 2015-02-03 ENCOUNTER — Other Ambulatory Visit (HOSPITAL_BASED_OUTPATIENT_CLINIC_OR_DEPARTMENT_OTHER): Payer: Medicare Other

## 2015-02-03 ENCOUNTER — Encounter: Payer: Self-pay | Admitting: Nurse Practitioner

## 2015-02-03 VITALS — BP 118/48 | HR 59 | Temp 97.8°F | Resp 18 | Ht 60.0 in | Wt 127.0 lb

## 2015-02-03 DIAGNOSIS — R188 Other ascites: Secondary | ICD-10-CM

## 2015-02-03 DIAGNOSIS — K7460 Unspecified cirrhosis of liver: Secondary | ICD-10-CM

## 2015-02-03 DIAGNOSIS — C8589 Other specified types of non-Hodgkin lymphoma, extranodal and solid organ sites: Secondary | ICD-10-CM

## 2015-02-03 DIAGNOSIS — C8299 Follicular lymphoma, unspecified, extranodal and solid organ sites: Secondary | ICD-10-CM

## 2015-02-03 DIAGNOSIS — C8510 Unspecified B-cell lymphoma, unspecified site: Secondary | ICD-10-CM

## 2015-02-03 LAB — CBC WITH DIFFERENTIAL/PLATELET
BASO%: 1.4 % (ref 0.0–2.0)
Basophils Absolute: 0.1 10*3/uL (ref 0.0–0.1)
EOS%: 5.4 % (ref 0.0–7.0)
Eosinophils Absolute: 0.2 10*3/uL (ref 0.0–0.5)
HCT: 32.1 % — ABNORMAL LOW (ref 34.8–46.6)
HGB: 10.5 g/dL — ABNORMAL LOW (ref 11.6–15.9)
LYMPH%: 25.5 % (ref 14.0–49.7)
MCH: 33.4 pg (ref 25.1–34.0)
MCHC: 32.6 g/dL (ref 31.5–36.0)
MCV: 102.5 fL — ABNORMAL HIGH (ref 79.5–101.0)
MONO#: 0.5 10*3/uL (ref 0.1–0.9)
MONO%: 11.1 % (ref 0.0–14.0)
NEUT#: 2.6 10*3/uL (ref 1.5–6.5)
NEUT%: 56.6 % (ref 38.4–76.8)
PLATELETS: 196 10*3/uL (ref 145–400)
RBC: 3.14 10*6/uL — AB (ref 3.70–5.45)
RDW: 15.6 % — ABNORMAL HIGH (ref 11.2–14.5)
WBC: 4.5 10*3/uL (ref 3.9–10.3)
lymph#: 1.2 10*3/uL (ref 0.9–3.3)

## 2015-02-03 LAB — COMPREHENSIVE METABOLIC PANEL
ALT: 11 U/L (ref 0–55)
ANION GAP: 14 meq/L — AB (ref 3–11)
AST: 18 U/L (ref 5–34)
Albumin: 3.1 g/dL — ABNORMAL LOW (ref 3.5–5.0)
Alkaline Phosphatase: 141 U/L (ref 40–150)
BUN: 27.1 mg/dL — ABNORMAL HIGH (ref 7.0–26.0)
CHLORIDE: 96 meq/L — AB (ref 98–109)
CO2: 29 meq/L (ref 22–29)
CREATININE: 3.9 mg/dL — AB (ref 0.6–1.1)
Calcium: 10.7 mg/dL — ABNORMAL HIGH (ref 8.4–10.4)
EGFR: 11 mL/min/{1.73_m2} — AB (ref >90)
Glucose: 171 mg/dl — ABNORMAL HIGH (ref 70–140)
Potassium: 4.2 mEq/L (ref 3.5–5.1)
Sodium: 139 mEq/L (ref 136–145)
Total Bilirubin: 0.48 mg/dL (ref 0.20–1.20)
Total Protein: 7.5 g/dL (ref 6.4–8.3)

## 2015-02-03 LAB — LACTATE DEHYDROGENASE: LDH: 148 U/L (ref 125–245)

## 2015-02-03 NOTE — Discharge Instructions (Signed)
Paracentesis, cuidados posteriores (Paracentesis, Care After) Siga estas instrucciones durante las prximas semanas. Estas indicaciones le proporcionan informacin acerca de cmo deber cuidarse despus del procedimiento. El mdico tambin podr darle instrucciones ms especficas. El tratamiento ha sido planificado segn las prcticas mdicas actuales, pero en algunos casos pueden ocurrir problemas. Comunquese con el mdico si tiene algn problema o dudas despus del procedimiento. QU ESPERAR DESPUS DEL PROCEDIMIENTO Despus del procedimiento, es normal que emane una pequea cantidad de lquido transparente del lugar de la puncin. INSTRUCCIONES PARA EL CUIDADO EN EL HOGAR  Reanude sus actividades normales como se lo haya indicado el mdico. Pregntele al mdico qu actividades son seguras para usted.  Tome los medicamentos de venta libre y los recetados solamente como se lo haya indicado el mdico.  No tome baos de inmersin, no nade ni use el jacuzzi hasta que el mdico lo autorice.  Siga las indicaciones del mdico acerca de lo siguiente:  Cmo Government social research officer de la puncin.  Cmo y cundo cambiar las vendas (vendaje).  Cundo retirar el vendaje.  Controle el sitio de la puncin todos los das para detectar signos de infeccin. Est atento a lo siguiente:  Dolor, hinchazn o enrojecimiento.  Lquido, sangre o pus.  Concurra a todas las visitas de control como se lo haya indicado el mdico. Esto es importante. SOLICITE ATENCIN MDICA SI:  Tiene enrojecimiento, hinchazn o Management consultant de la puncin.  Aumenta la cantidad de lquido transparente que emana del lugar de la puncin.  Observa que emanan sangre o pus del lugar de la puncin.  Tiene escalofros.  Tiene fiebre. SOLICITE ATENCIN MDICA DE INMEDIATO SI:  Tiene dolor de pecho o le falta el aire.  Presenta dolor, molestias o hinchazn en el abdomen.  Tiene sensacin de [employer] o de desvanecimiento, o se  desmaya.   Esta informacin no tiene Marine scientist el consejo del mdico. Asegrese de hacerle al mdico cualquier pregunta que tenga.   Document Released: 10/19/2014 Elsevier Interactive Patient Education [employer].

## 2015-02-03 NOTE — Telephone Encounter (Signed)
Appointments made and avs printed for patient °

## 2015-02-03 NOTE — Progress Notes (Signed)
ID: LIBNY BLADEN   DOB: July 31, 1940  MR#: [id number]  [id number]  PCP: Kathlene November GYN:  SU: Dr. Star Age III OTHER MD: Lauree Chandler, Roney Jaffe  DIAGNOSIS: Non-Hodgkin's lymphoma  Chief complaint: Follow up visit for lymphoma and  review of PET scans  HISTORY OF PRESENT ILLNESS: As per previously documented note:  "The patient was originally referred in 2007, when she presented with gross hematuria.  Dr. Jeffie Pollock obtained CT urogram, which showed small kidneys bilaterally (the patient has end-stage renal disease) with no obvious stones or renal lesions.  In the bladder, there was a donut-shaped mass at the bladder neck and cystoscopy showed a smooth bladder without papillary lesions but on retroflexion there was a donut-like lesion relatively circumferential at the bladder neck with some bleeding on the posterior aspect.  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  The patient was referred for further evaluation but failed to show (I actually wrote her a letter in [place] with a 2nd appointment but she failed to show for that appointment as well.).   More recently, the patient again developed gross hematuria.  She again went to see Dr. Jeffie Pollock and he again obtained a CT of the abdomen and pelvis without contrast.  Interestingly, some of the abdominal lymph nodes previously noted are now a little bit smaller.  The left ureter, which was questionably or intermittently dilated previously, is now clearly dilated to the level of S3, at which point it is difficult to follow.  There is bladder wall thickening, particularly on the right, and there is an enlarged left groin lymph node measuring 2.5 cm.  Otherwise, the left groin adenopathy is decreased compared to 2007"  Her subsequent history is as detailed below.  INTERVAL HISTORY: [name] returns today for follow up, accompanied by her daughter, her son, and a Training and development officer.  She has not been seen by this office for 18 months, and is not here with concerns regarding her lymphoma. She has no "B" symptoms, no fevers, chills, night sweats, lymphadenopathy, unexplained weight loss, or excessive fatigue. She however does have a grossly distended abdomen filled with fluid. She has a history of cirrhosis of the liver, with just 1 paracentesis in the past. This was exactly 1 year ago. She has not been to any appointments with Dr. Larose Kells or Dr. Jana Hakim since this was performed. She is having pelvic pain and pain to her left and right groin. She has a left inguinal hernia, but there are no plans for surgery to this area.   REVIEW OF SYSTEMS: [name] is chronically constipated and uses sorbitol daily. She has a bowel movement 3 times weekly. She does not urinate and is on hemodialysis on Tues, Thurs, and Saturday. Her appetite is down. Besides her groin, there is no pain. She denies shortness of breath, chest pain, cough, or palpitations. She denies headaches, dizziness, or vision changes. She has no swelling to any of her extremities.  A detailed review of systems is otherwise stable.  PAST MEDICAL HISTORY: Past Medical History  Diagnosis Date  . Non-Hodgkin lymphoma 2007  . ESRD on dialysis     dialysis  . Hypertension   . Diabetes mellitus   . Bladder cancer Jan 10, 2006    BLADDER NECK, BIOPSY: DIFFUSE LARGE B-CELL LYMPHOMA. CT showed worrisome pelvic LN. No treatment records in EPIC.  Marland Kitchen Hyperparathyroidism, secondary   . Atrial fibrillation     on ASA  .  Pulmonary HTN   . Blind 2005  Significant for malignant hypertension, currently better  controlled;  diabetes mellitus complicated by blindness;  end-stage renal disease with significant proteinuria;  anemia;  secondary hyperparathyroidism;  and renal osteodystrophy.  She has a fistula in the left upper extremity placed June of 2007.  Her diabetes, she says, is well controlled.  She is essentially blind secondary to that.   There is also a history of hypothyroidism.  PAST SURGICAL HISTORY: Past Surgical History  Procedure Laterality Date  . Insertion of dialysis catheter      FAMILY HISTORY Family History  Problem Relation Age of Onset  . Thyroid disease Mother   . Lung cancer Father   . Colon cancer Neg Hx   . Breast cancer Neg Hx   The patient's father died at the age of 64 from "lung problems".  The patient's mother died at the age of 59 from pneumonia.  The patient has 3 sisters and 4 brothers.  She is not aware of a history of cancer in the family.    GYNECOLOGIC HISTORY: She had 7 children.  She does not recall when she went through the change of life.  She did not take hormones.  SOCIAL HISTORY: She was born [place] in [place].  She has lived in the [place] about 8 years.  Her husband, Luiz Ochoa, is retired from Architect and is present today, as are 2 children, Mudlogger (who works in a [employer]) and Educational psychologist (who works in [employer]).  The patient has more grandchildren than she can count, and innumerable great-grandchildren as well.     ADVANCED DIRECTIVES:  HEALTH MAINTENANCE: Social History  Substance Use Topics  . Smoking status: Never Smoker   . Smokeless tobacco: Never Used  . Alcohol Use: No     Colonoscopy:  PAP:  Bone density:  Lipid panel:  No Known Allergies  Current Outpatient Prescriptions  Medication Sig Dispense Refill  . glipiZIDE (GLUCOTROL) 10 MG tablet Take 10 mg by mouth daily before breakfast.    . labetalol (NORMODYNE) 200 MG tablet Take 200 mg by mouth 2 (two) times daily.    Marland Kitchen omeprazole (PRILOSEC) 20 MG capsule Take 40 mg by mouth daily.     . SENSIPAR 60 MG tablet     . sorbitol 70 % solution take 30 milliliters by mouth once daily if needed for constipation 300 mL 0   No current facility-administered medications for this visit.    OBJECTIVE: Hispanic woman who appears older than stated age 41 Vitals:   02/03/15 1040 02/03/15 1102   BP: 123/39 118/48  Pulse: 59   Temp: 97.8 F (36.6 C)   Resp: 18      Body mass index is 24.8 kg/(m^2).    ECOG FS: 1 Filed Weights   02/03/15 1040  Weight: 127 lb (57.607 kg)    Skin: warm, dry  HEENT: sclerae anicteric, conjunctivae pink, oropharynx clear. No thrush or mucositis.  Lymph Nodes: No cervical or supraclavicular lymphadenopathy  Lungs: clear to auscultation bilaterally, no rales, wheezes, or rhonci  Heart: irregular rate and rhythm, systolic murmur noted.  Abdomen: severely distended but soft, positive bowel sounds, non tender, no masses palpated. Left inguinal hernia.  Musculoskeletal: No focal spinal tenderness, no peripheral edema  Neuro: non focal, well oriented, positive affect  Breasts: bilateral breasts unremarkable. No suspicious masses palpated. No skin or nipple changes. Bilateral axillae benign.   LAB RESULTS: Lab Results  Component Value Date  WBC 4.5 02/03/2015   NEUTROABS 2.6 02/03/2015   HGB 10.5* 02/03/2015   HCT 32.1* 02/03/2015   MCV 102.5* 02/03/2015   PLT 196 02/03/2015      Chemistry      Component Value Date/Time   NA 137 01/27/2014 0923   NA 139 09/01/2013 1313   K 5.0 01/27/2014 0923   K 4.3 09/01/2013 1313   CL 95* 01/27/2014 0923   CL 97* 10/16/2011 1425   CO2 28 01/27/2014 0923   CO2 28 09/01/2013 1313   BUN 38* 01/27/2014 0923   BUN 38.7* 09/01/2013 1313   CREATININE 5.4* 01/27/2014 0923   CREATININE 4.9* 09/01/2013 1313      Component Value Date/Time   CALCIUM 9.7 01/27/2014 0923   CALCIUM 10.2 09/01/2013 1313   ALKPHOS 344* 01/27/2014 0923   ALKPHOS 463* 09/01/2013 1313   AST 15 01/27/2014 0923   AST 18 09/01/2013 1313   ALT 10 01/27/2014 0923   ALT 15 09/01/2013 1313   BILITOT 0.6 01/27/2014 0923   BILITOT 0.67 09/01/2013 1313      STUDIES: No results found.  ASSESSMENT: 74 y.o. 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 I believe this abdominal pressure has made the pain to her left inguinal hernia worse. She would benefit from a paracentesis, so I am working to facilitate that today. I am having the results sent off for cytology. The results of the previous year's cytology were negative for malignancy. I have also placed a referral to [facility] to have this problem managed more acutely. There are several causes of cirrhosis of the liver and her etiology is unclear. For now she will avoid salt intake. The labs were reviewed in detail and her liver enzymes are suprisingly normal today.   [name] is scheduled for follow up with her PCP Dr. Larose Kells in January. Her blood sugars are poorly controlled, 171 today, but her daughter states this is better than what it usually is. She will continue her glipizide as ordered. Her electrolytes are fairly stable despite hemodialysis.   [name] will return in 2 months for follow up. If she is stable at this point we will expand follow up to every 6 months. She understands and agrees with this plan. She has been encouraged to call with any issues that might arise before her next visit here.   Total time spent in appointment was 50 minutes, with greater than 50% of the time spent face to face with the patient.   Valerie Panda, NP 02/03/2015

## 2015-02-03 NOTE — Progress Notes (Signed)
Critical lab today- creatinine 3.9 Gentry Fitz, NP aware

## 2015-02-07 LAB — CYTOLOGY - NON PAP

## 2015-03-06 ENCOUNTER — Telehealth: Payer: Self-pay | Admitting: Internal Medicine

## 2015-03-06 ENCOUNTER — Encounter: Payer: Medicare Other | Admitting: Internal Medicine

## 2015-03-08 ENCOUNTER — Encounter: Payer: Self-pay | Admitting: Internal Medicine

## 2015-03-08 NOTE — Telephone Encounter (Signed)
Pt was no show 03/06/15 10:00am for cpe appt, pt has not rescheduled, last OV 2015, charge or no charge?

## 2015-03-08 NOTE — Telephone Encounter (Signed)
Charge. 

## 2015-03-08 NOTE — Telephone Encounter (Signed)
Marked to charge, mailing letters

## 2015-04-10 ENCOUNTER — Telehealth: Payer: Self-pay | Admitting: Oncology

## 2015-04-10 ENCOUNTER — Other Ambulatory Visit (HOSPITAL_BASED_OUTPATIENT_CLINIC_OR_DEPARTMENT_OTHER): Payer: Medicare Other

## 2015-04-10 ENCOUNTER — Ambulatory Visit (HOSPITAL_BASED_OUTPATIENT_CLINIC_OR_DEPARTMENT_OTHER): Payer: Medicare Other | Admitting: Oncology

## 2015-04-10 VITALS — BP 135/34 | HR 61 | Temp 98.1°F | Resp 18 | Ht 60.0 in | Wt 110.6 lb

## 2015-04-10 DIAGNOSIS — E119 Type 2 diabetes mellitus without complications: Secondary | ICD-10-CM

## 2015-04-10 DIAGNOSIS — K7469 Other cirrhosis of liver: Secondary | ICD-10-CM

## 2015-04-10 DIAGNOSIS — Z8572 Personal history of non-Hodgkin lymphomas: Secondary | ICD-10-CM | POA: Diagnosis present

## 2015-04-10 DIAGNOSIS — Z992 Dependence on renal dialysis: Secondary | ICD-10-CM

## 2015-04-10 DIAGNOSIS — I4891 Unspecified atrial fibrillation: Secondary | ICD-10-CM

## 2015-04-10 DIAGNOSIS — B029 Zoster without complications: Secondary | ICD-10-CM | POA: Diagnosis not present

## 2015-04-10 DIAGNOSIS — C8299 Follicular lymphoma, unspecified, extranodal and solid organ sites: Secondary | ICD-10-CM

## 2015-04-10 DIAGNOSIS — R188 Other ascites: Secondary | ICD-10-CM

## 2015-04-10 DIAGNOSIS — I10 Essential (primary) hypertension: Secondary | ICD-10-CM

## 2015-04-10 DIAGNOSIS — H540 Blindness, both eyes: Secondary | ICD-10-CM

## 2015-04-10 DIAGNOSIS — C8599 Non-Hodgkin lymphoma, unspecified, extranodal and solid organ sites: Secondary | ICD-10-CM

## 2015-04-10 DIAGNOSIS — N186 End stage renal disease: Secondary | ICD-10-CM | POA: Diagnosis not present

## 2015-04-10 DIAGNOSIS — K7460 Unspecified cirrhosis of liver: Secondary | ICD-10-CM

## 2015-04-10 LAB — CBC WITH DIFFERENTIAL/PLATELET
BASO%: 1.3 % (ref 0.0–2.0)
Basophils Absolute: 0.1 10*3/uL (ref 0.0–0.1)
EOS ABS: 0.2 10*3/uL (ref 0.0–0.5)
EOS%: 4.9 % (ref 0.0–7.0)
HCT: 34.3 % — ABNORMAL LOW (ref 34.8–46.6)
HGB: 11.5 g/dL — ABNORMAL LOW (ref 11.6–15.9)
LYMPH%: 26 % (ref 14.0–49.7)
MCH: 33.9 pg (ref 25.1–34.0)
MCHC: 33.6 g/dL (ref 31.5–36.0)
MCV: 100.9 fL (ref 79.5–101.0)
MONO#: 0.4 10*3/uL (ref 0.1–0.9)
MONO%: 9.1 % (ref 0.0–14.0)
NEUT#: 2.7 10*3/uL (ref 1.5–6.5)
NEUT%: 58.7 % (ref 38.4–76.8)
PLATELETS: 194 10*3/uL (ref 145–400)
RBC: 3.4 10*6/uL — AB (ref 3.70–5.45)
RDW: 15.5 % — ABNORMAL HIGH (ref 11.2–14.5)
WBC: 4.7 10*3/uL (ref 3.9–10.3)
lymph#: 1.2 10*3/uL (ref 0.9–3.3)

## 2015-04-10 LAB — COMPREHENSIVE METABOLIC PANEL
ALT: 20 U/L (ref 0–55)
AST: 23 U/L (ref 5–34)
Albumin: 3.3 g/dL — ABNORMAL LOW (ref 3.5–5.0)
Alkaline Phosphatase: 162 U/L — ABNORMAL HIGH (ref 40–150)
Anion Gap: 14 mEq/L — ABNORMAL HIGH (ref 3–11)
BILIRUBIN TOTAL: 0.43 mg/dL (ref 0.20–1.20)
BUN: 54.5 mg/dL — ABNORMAL HIGH (ref 7.0–26.0)
CHLORIDE: 98 meq/L (ref 98–109)
CO2: 23 meq/L (ref 22–29)
CREATININE: 5 mg/dL — AB (ref 0.6–1.1)
Calcium: 9.1 mg/dL (ref 8.4–10.4)
EGFR: 8 mL/min/{1.73_m2} — AB (ref >90)
GLUCOSE: 273 mg/dL — AB (ref 70–140)
Potassium: 4.5 mEq/L (ref 3.5–5.1)
SODIUM: 135 meq/L — AB (ref 136–145)
TOTAL PROTEIN: 7.5 g/dL (ref 6.4–8.3)

## 2015-04-10 LAB — LACTATE DEHYDROGENASE: LDH: 157 U/L (ref 125–245)

## 2015-04-10 MED ORDER — DIPHENHYDRAMINE-ZINC ACETATE 1-0.1 % EX CREA: TOPICAL_CREAM | Freq: Three times a day (TID) | CUTANEOUS | Status: DC | PRN

## 2015-04-10 NOTE — Progress Notes (Signed)
ID: [name]   DOB: October 02, 1940  MR#: [id number]  [id number]  PCP: [name] GYN:  SU: [name] OTHER MD: [name], [name]  DIAGNOSIS: Non-Hodgkin's lymphoma  Chief complaint: Follow up visit for lymphoma and  review of PET scans  HISTORY OF PRESENT ILLNESS: As per previously documented note:  "The patient was originally referred in 2007, when she presented with gross hematuria.  [name] obtained CT urogram, which showed small kidneys bilaterally (the patient has end-stage renal disease) with no obvious stones or renal lesions.  In the bladder, there was a donut-shaped mass at the bladder neck and cystoscopy showed a smooth bladder without papillary lesions but on retroflexion there was a donut-like lesion relatively circumferential at the bladder neck with some bleeding on the posterior aspect.  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  The patient was referred for further evaluation but failed to show (I actually wrote her a letter in [place] with a 2nd appointment but she failed to show for that appointment as well.).   More recently, the patient again developed gross hematuria.  She again went to see [name] and he again obtained a CT of the abdomen and pelvis without contrast.  Interestingly, some of the abdominal lymph nodes previously noted are now a little bit smaller.  The left ureter, which was questionably or intermittently dilated previously, is now clearly dilated to the level of S3, at which point it is difficult to follow.  There is bladder wall thickening, particularly on the right, and there is an enlarged left groin lymph node measuring 2.5 cm.  Otherwise, the left groin adenopathy is decreased compared to 2007"  Her subsequent history is as detailed below.  INTERVAL HISTORY: [name] returns today for follow up, accompanied by one of her grandsons.asked what her worse problem is she  says she has an itchy in her upper back that she wants me to look at. She is concerned about her left inguinal hernia which seems to be getting bigger. However she denies pain. There has been no fever, no  Blood in her urine, and she continues to go to hemodialysis 3 times a week.  REVIEW OF SYSTEMS: [name] at least half the day in bed. She complains of cramps. Sometimes her vision is a little bit blurred. She can have trouble swallowing. Sometimes her ankles swell. Her appetite is poor. She bruises easily. She says her sugars are "okay". A detailed review of systems today was otherwise stable  PAST MEDICAL HISTORY: Past Medical History  Diagnosis Date  . ESRD on dialysis [facility])     dialysis  . Hypertension   . Diabetes mellitus   . Hyperparathyroidism, secondary ([place])   . Atrial fibrillation (HCC)     on ASA  . Pulmonary HTN ([place])   . Blind 2005  . Non-Hodgkin lymphoma ([place]) 2007  . Bladder cancer [facility]) Jan 10, 2006    BLADDER NECK, BIOPSY: DIFFUSE LARGE B-CELL LYMPHOMA. CT showed worrisome pelvic LN. No treatment records in EPIC.  Significant for malignant hypertension, currently better  controlled;  diabetes mellitus complicated by blindness;  end-stage renal disease with significant proteinuria;  anemia;  secondary hyperparathyroidism;  and renal osteodystrophy.  She has a fistula in the left upper extremity placed June of 2007.  Her diabetes, she says, is well controlled.  She is essentially blind secondary to that.  There is also a history of hypothyroidism.  PAST SURGICAL HISTORY: Past Surgical History  Procedure Laterality Date  . Insertion of dialysis catheter      FAMILY HISTORY Family History  Problem Relation Age of Onset  . Thyroid disease Mother   . Lung cancer Father   . Colon cancer Neg Hx   . Breast cancer Neg Hx   The patient's father died at the age of 69 from "lung problems".  The patient's mother died at the age of 54 from pneumonia.  The patient has 3  sisters and 4 brothers.  She is not aware of a history of cancer in the family.    GYNECOLOGIC HISTORY: She had 7 children.  She does not recall when she went through the change of life.  She did not take hormones.  SOCIAL HISTORY: She was born [place] in [place].  She has lived in the [place] about 8 years.  Her husband, [name], is retired from [employer] and is present today, as are 2 children, [name] (who works in a [place]) and [name] (who works in [employer]).  The patient has more grandchildren than she can count, and innumerable great-grandchildren as well.     ADVANCED DIRECTIVES:  HEALTH MAINTENANCE: Social History  Substance Use Topics  . Smoking status: Never Smoker   . Smokeless tobacco: Never Used  . Alcohol Use: No     Colonoscopy:  PAP:  Bone density:  Lipid panel:  No Known Allergies  Current Outpatient Prescriptions  Medication Sig Dispense Refill  . glipiZIDE (GLUCOTROL) 10 MG tablet Take 10 mg by mouth daily before breakfast.    . labetalol (NORMODYNE) 200 MG tablet Take 200 mg by mouth 2 (two) times daily.    Marland Kitchen omeprazole (PRILOSEC) 20 MG capsule Take 40 mg by mouth daily.     . SENSIPAR 60 MG tablet     . sorbitol 70 % solution take 30 milliliters by mouth once daily if needed for constipation 300 mL 0   No current facility-administered medications for this visit.    OBJECTIVE: Hispanic woman examined in a wheelchair Filed Vitals:   04/10/15 0959  BP: 135/34  Pulse: 61  Temp: 98.1 F (36.7 C)  Resp: 18     Body mass index is 21.6 kg/(m^2).    ECOG FS: 3 Filed Weights   04/10/15 0959  Weight: 110 lb 9.6 oz (50.168 kg)    Sclerae unicteric, EOMs intact Oropharynx clear, no thrush or other lesions No cervical or supraclavicular adenopathy Lungs no rales or rhonchi Heart regular rate and rhythm Abd soft, distended, nontender, positive bowel sounds MSK no focal spinal tenderness Neuro: nonfocal, well oriented, positive  affect Breasts: deferred Skin: She has some lesions in the upper left breast which do not cross the midline and are consistent with resolving shingles.  LAB RESULTS: Lab Results  Component Value Date   WBC 4.7 04/10/2015   NEUTROABS 2.7 04/10/2015   HGB 11.5* 04/10/2015   HCT 34.3* 04/10/2015   MCV 100.9 04/10/2015   PLT 194 04/10/2015      Chemistry      Component Value Date/Time   NA 139 02/03/2015 1019   NA 137 01/27/2014 0923   K 4.2 02/03/2015 1019   K 5.0 01/27/2014 0923   CL 95* 01/27/2014 0923   CL 97* 10/16/2011 1425   CO2 29 02/03/2015 1019   CO2 28 01/27/2014 0923   BUN 27.1* 02/03/2015 1019   BUN 38* 01/27/2014 0923   CREATININE 3.9* 02/03/2015 1019  CREATININE 5.4* 01/27/2014 0923      Component Value Date/Time   CALCIUM 10.7* 02/03/2015 1019   CALCIUM 9.7 01/27/2014 0923   ALKPHOS 141 02/03/2015 1019   ALKPHOS 344* 01/27/2014 0923   AST 18 02/03/2015 1019   AST 15 01/27/2014 0923   ALT 11 02/03/2015 1019   ALT 10 01/27/2014 0923   BILITOT 0.48 02/03/2015 1019   BILITOT 0.6 01/27/2014 0923      STUDIES: No results found.  ASSESSMENT: 75 y.o. 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 [name] is doing well from a lymphoma point of view. She does not have any systemic symptoms relating to it, and has  No recurrence of her hematuria.  I gave a copy of the CT scan  to her grandson so the family can understand that she has significant cirrhosis in addition to her kidney failure.  Her shingles has been present for more than 2 weeks and therefore proceeding with antivirals at this point would not be helpful. She is getting some relief from Benadryl cream and we will continue that.  Those are her two be problems.. She tells me that if she is going to be dying it would be helpful to now because getting buried in [place] is a lot cheaper. However I really don't know how long she will live. She will have to take her chances just like the rest of [place].  At this point I'm going to start seeing her on a once a year basis. I will be glad to see her before next year though if the need arises.   Chauncey Cruel, MD 04/10/2015

## 2015-04-10 NOTE — Telephone Encounter (Signed)
Gave patient/son avs report and appointments for February 2018.

## 2015-08-23 ENCOUNTER — Other Ambulatory Visit: Payer: Self-pay | Admitting: *Deleted

## 2015-08-23 DIAGNOSIS — N186 End stage renal disease: Secondary | ICD-10-CM

## 2015-08-23 DIAGNOSIS — T82510A Breakdown (mechanical) of surgically created arteriovenous fistula, initial encounter: Secondary | ICD-10-CM

## 2015-08-25 ENCOUNTER — Encounter: Payer: Self-pay | Admitting: Surgery

## 2015-08-30 ENCOUNTER — Ambulatory Visit (HOSPITAL_COMMUNITY)
Admission: RE | Admit: 2015-08-30 | Discharge: 2015-08-30 | Disposition: A | Discharge status: Home / Self Care | Payer: Medicare Other | Source: Ambulatory Visit | Attending: Surgery | Admitting: Surgery

## 2015-08-30 ENCOUNTER — Other Ambulatory Visit: Payer: Self-pay

## 2015-08-30 ENCOUNTER — Ambulatory Visit (INDEPENDENT_AMBULATORY_CARE_PROVIDER_SITE_OTHER): Payer: Medicare Other | Admitting: Surgery

## 2015-08-30 ENCOUNTER — Encounter: Payer: Self-pay | Admitting: Surgery

## 2015-08-30 VITALS — BP 148/52 | HR 53 | Temp 97.5°F | Resp 18 | Ht 59.5 in | Wt 113.4 lb

## 2015-08-30 DIAGNOSIS — I272 Other secondary pulmonary hypertension: Secondary | ICD-10-CM | POA: Diagnosis not present

## 2015-08-30 DIAGNOSIS — I120 Hypertensive chronic kidney disease with stage 5 chronic kidney disease or end stage renal disease: Secondary | ICD-10-CM | POA: Diagnosis not present

## 2015-08-30 DIAGNOSIS — I868 Varicose veins of other specified sites: Secondary | ICD-10-CM | POA: Diagnosis not present

## 2015-08-30 DIAGNOSIS — Z992 Dependence on renal dialysis: Secondary | ICD-10-CM | POA: Diagnosis not present

## 2015-08-30 DIAGNOSIS — E1122 Type 2 diabetes mellitus with diabetic chronic kidney disease: Secondary | ICD-10-CM | POA: Diagnosis not present

## 2015-08-30 DIAGNOSIS — T82510A Breakdown (mechanical) of surgically created arteriovenous fistula, initial encounter: Secondary | ICD-10-CM | POA: Diagnosis not present

## 2015-08-30 DIAGNOSIS — N186 End stage renal disease: Secondary | ICD-10-CM

## 2015-08-30 NOTE — Progress Notes (Signed)
[facility] [place]  Patient name: [name] MRN: [id number] DOB: 1940/07/15 Sex: female  REFERRING PHYSICIAN: Dr. Mercy Moore  REASON FOR CONSULT: Fistula aneurysm  HPI: [name] is a 75 y.o. female, who had a left brachiocephalic fistula created approximately 10 years ago.  She is on dialysis Tuesday Thursday Saturday.  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  There are no scabs or ulcers.  She does have pain with cannulation.  The patient is medically managed for diabetes and hypertension.  She has atrial fibrillation.  She is not on anticoagulation, just aspirin.  Past Medical History  Diagnosis Date  . ESRD on dialysis [facility])     dialysis  . Hypertension   . Diabetes mellitus   . Hyperparathyroidism, secondary ([place])   . Atrial fibrillation (HCC)     on ASA  . Pulmonary HTN ([place])   . Blind 2005  . Non-Hodgkin lymphoma ([place]) 2007  . Bladder cancer [facility]) Jan 10, 2006    BLADDER NECK, BIOPSY: DIFFUSE LARGE B-CELL LYMPHOMA. CT showed worrisome pelvic LN. No treatment records in EPIC.    Family History  Problem Relation Age of Onset  . Thyroid disease Mother   . Lung cancer Father   . Colon cancer Neg Hx   . Breast cancer Neg Hx     SOCIAL HISTORY: Social History   Social History  . Marital Status: Married    Spouse Name: N/A  . Number of Children: 8  . Years of Education: N/A   Occupational History  . Disabled    Social History Main Topics  . Smoking status: Never Smoker   . Smokeless tobacco: Never Used  . Alcohol Use: No  . Drug Use: No  . Sexual Activity: Not on file   Other Topics Concern  . Not on file   Social History Narrative   Original from [place]    In [place] since ~ 2006   household -- husband       Communicate with daughter-in-law Murray Hodgkins [phone]    No Known Allergies  Current Outpatient Prescriptions  Medication Sig Dispense Refill  . acetaminophen  (TYLENOL) 325 MG tablet Take 650 mg by mouth every 6 (six) hours as needed.    . diphenhydrAMINE-zinc acetate (BENADRYL) cream Apply topically 3 (three) times daily as needed for itching. 28.3 g 0  . glipiZIDE (GLUCOTROL) 10 MG tablet Take 10 mg by mouth daily before breakfast.    . labetalol (NORMODYNE) 200 MG tablet Take 200 mg by mouth 2 (two) times daily.    Marland Kitchen omeprazole (PRILOSEC) 20 MG capsule Take 40 mg by mouth daily.     . SENSIPAR 60 MG tablet     . sorbitol 70 % solution take 30 milliliters by mouth once daily if needed for constipation 300 mL 0   No current facility-administered medications for this visit.    REVIEW OF SYSTEMS:  [X]  denotes positive finding, [ ]  denotes negative finding Cardiac  Comments:  Chest pain or chest pressure: x   Shortness of breath upon exertion:    Short of breath when lying flat:    Irregular heart rhythm:        Vascular    Pain in calf, thigh, or hip brought on by ambulation:    Pain in feet at night that wakes you up from your sleep:     Blood clot in your veins:    Leg swelling:  x  Pulmonary    Oxygen at home:    Productive cough:     Wheezing:         Neurologic    Sudden weakness in arms or legs:     Sudden numbness in arms or legs:  x   Sudden onset of difficulty speaking or slurred speech:    Temporary loss of vision in one eye:  x   Problems with dizziness:  x       Gastrointestinal    Blood in stool:     Vomited blood:         Genitourinary    Burning when urinating:     Blood in urine:        Psychiatric    Major depression:         Hematologic    Bleeding problems:    Problems with blood clotting too easily:        Skin    Rashes or ulcers:        Constitutional    Fever or chills:      PHYSICAL EXAM: Filed Vitals:   08/30/15 0910 08/30/15 0914  BP: 155/54 148/52  Pulse: 53   Temp: 97.5 F (36.4 C)   TempSrc: Oral   Resp: 18   Height: 4' 11.5" (1.511 m)   Weight: 113 lb 6.4 oz (51.438 kg)    SpO2: 100%     GENERAL: The patient is a well-nourished female, in no acute distress. The vital signs are documented above. CARDIAC: There is a regular rate and rhythm.  VASCULAR: 2 large pseudoaneurysms off of the left brachiocephalic fistula.  There is skin thickening but no breakdown. PULMONARY: There is good air exchange bilaterally without wheezing or rales..  MUSCULOSKELETAL: There are no major deformities or cyanosis. NEUROLOGIC: No focal weakness or paresthesias are detected.  Vision impairment SKIN: There are no ulcers or rashes noted. PSYCHIATRIC: The patient has a normal affect.  DATA:  I have ordered and reviewed her fistula duplex.  This shows pseudoaneurysms in the fistula and plaque at the anastomosis.  MEDICAL ISSUES: Dialysis fistula pseudoaneurysm: The patient is having pain with cannulation.  I have recommended proceeding with plication of both of her aneurysms.  I feel that there is enough room and her cephalic vein proximal to the aneurysms to continue with dialysis in the upper arm.  I did discuss that if they were unable to cannulate the vein at this level that she would require a catheter for proximally one month while the incisions healed.  All of her questions were answered.  Her surgery has been scheduled for Wednesday, August 9.  This conversation occurred with the presence of a Spanish interpreter.   Annamarie Major, MD [facility] [phone] Pager [phone]

## 2015-08-30 NOTE — Progress Notes (Signed)
Filed Vitals:   08/30/15 0910 08/30/15 0914  BP: 155/54 148/52  Pulse: 53   Temp: 97.5 F (36.4 C)   TempSrc: Oral   Resp: 18   Height: 4' 11.5" (1.511 m)   Weight: 113 lb 6.4 oz (51.438 kg)   SpO2: 100%

## 2015-09-19 ENCOUNTER — Encounter (HOSPITAL_COMMUNITY): Payer: Self-pay | Admitting: *Deleted

## 2015-09-19 MED ORDER — DEXTROSE 5 % IV SOLN
1.5000 g | INTRAVENOUS | Status: AC
  Administered 2015-09-20: 1.5 g via INTRAVENOUS
  Filled 2015-09-19: qty 1.5

## 2015-09-19 NOTE — Progress Notes (Signed)
Ms Rameriez reports that CBG runs 1200- 300, did not check this am because she had to get ready to go to dialysis.  Patient repeated that she is to hold diabetes medication in am.  I gave her son the number for Pre- OP and to call if CBG is < 70 in am.   I completed phone interview using [address] [place], [place] [address].

## 2015-09-20 ENCOUNTER — Encounter (HOSPITAL_COMMUNITY): Payer: Self-pay | Admitting: Surgery

## 2015-09-20 ENCOUNTER — Encounter (HOSPITAL_COMMUNITY)
Admission: RE | Disposition: A | Discharge status: Home / Self Care | Payer: Self-pay | Source: Ambulatory Visit | Attending: Surgery

## 2015-09-20 ENCOUNTER — Ambulatory Visit (HOSPITAL_COMMUNITY): Payer: Medicare Other | Admitting: Anesthesiology

## 2015-09-20 ENCOUNTER — Ambulatory Visit (HOSPITAL_COMMUNITY)
Admission: RE | Admit: 2015-09-20 | Discharge: 2015-09-20 | Disposition: A | Discharge status: Home / Self Care | Payer: Medicare Other | Source: Ambulatory Visit | Attending: Surgery | Admitting: Surgery

## 2015-09-20 DIAGNOSIS — Z7982 Long term (current) use of aspirin: Secondary | ICD-10-CM | POA: Insufficient documentation

## 2015-09-20 DIAGNOSIS — Z8551 Personal history of malignant neoplasm of bladder: Secondary | ICD-10-CM | POA: Insufficient documentation

## 2015-09-20 DIAGNOSIS — Z8572 Personal history of non-Hodgkin lymphomas: Secondary | ICD-10-CM | POA: Diagnosis not present

## 2015-09-20 DIAGNOSIS — Z992 Dependence on renal dialysis: Secondary | ICD-10-CM | POA: Insufficient documentation

## 2015-09-20 DIAGNOSIS — I120 Hypertensive chronic kidney disease with stage 5 chronic kidney disease or end stage renal disease: Secondary | ICD-10-CM | POA: Diagnosis not present

## 2015-09-20 DIAGNOSIS — Y832 Surgical operation with anastomosis, bypass or graft as the cause of abnormal reaction of the patient, or of later complication, without mention of misadventure at the time of the procedure: Secondary | ICD-10-CM | POA: Diagnosis not present

## 2015-09-20 DIAGNOSIS — I4891 Unspecified atrial fibrillation: Secondary | ICD-10-CM | POA: Diagnosis not present

## 2015-09-20 DIAGNOSIS — T82898A Other specified complication of vascular prosthetic devices, implants and grafts, initial encounter: Secondary | ICD-10-CM | POA: Diagnosis present

## 2015-09-20 DIAGNOSIS — N186 End stage renal disease: Secondary | ICD-10-CM | POA: Insufficient documentation

## 2015-09-20 DIAGNOSIS — E1122 Type 2 diabetes mellitus with diabetic chronic kidney disease: Secondary | ICD-10-CM | POA: Diagnosis not present

## 2015-09-20 DIAGNOSIS — N2581 Secondary hyperparathyroidism of renal origin: Secondary | ICD-10-CM | POA: Insufficient documentation

## 2015-09-20 HISTORY — DX: Personal history of other medical treatment: Z92.89

## 2015-09-20 HISTORY — DX: Constipation, unspecified: K59.00

## 2015-09-20 HISTORY — PX: RESECTION OF ARTERIOVENOUS FISTULA ANEURYSM: SHX6070

## 2015-09-20 HISTORY — DX: Reserved for inherently not codable concepts without codable children: IMO0001

## 2015-09-20 HISTORY — DX: Reserved for concepts with insufficient information to code with codable children: IMO0002

## 2015-09-20 LAB — GLUCOSE, CAPILLARY: GLUCOSE-CAPILLARY: 170 mg/dL — AB (ref 65–99)

## 2015-09-20 LAB — POCT I-STAT 4, (NA,K, GLUC, HGB,HCT)
Glucose, Bld: 190 mg/dL — ABNORMAL HIGH (ref 65–99)
HCT: 36 % (ref 36.0–46.0)
Hemoglobin: 12.2 g/dL (ref 12.0–15.0)
Potassium: 4.1 mmol/L (ref 3.5–5.1)
SODIUM: 135 mmol/L (ref 135–145)

## 2015-09-20 SURGERY — RESECTION OF ARTERIOVENOUS FISTULA ANEURYSM
Anesthesia: Monitor Anesthesia Care | Site: Arm Lower | Laterality: Left

## 2015-09-20 MED ORDER — MIDAZOLAM HCL 2 MG/2ML IJ SOLN
0.5000 mg | Freq: Once | INTRAMUSCULAR | Status: AC
  Administered 2015-09-20: 0.5 mg via INTRAVENOUS

## 2015-09-20 MED ORDER — LIDOCAINE-EPINEPHRINE (PF) 1 %-1:200000 IJ SOLN: INTRAMUSCULAR | Status: DC | PRN

## 2015-09-20 MED ORDER — OXYCODONE-ACETAMINOPHEN 5-325 MG PO TABS: 1.0000 | ORAL_TABLET | Freq: Four times a day (QID) | ORAL | 0 refills | Status: DC | PRN

## 2015-09-20 MED ORDER — FENTANYL CITRATE (PF) 100 MCG/2ML IJ SOLN
INTRAMUSCULAR | Status: AC
  Administered 2015-09-20: 50 ug via INTRAVENOUS
  Filled 2015-09-20: qty 2

## 2015-09-20 MED ORDER — MIDAZOLAM HCL 5 MG/5ML IJ SOLN
INTRAMUSCULAR | Status: DC | PRN
  Administered 2015-09-20: 1 mg via INTRAVENOUS

## 2015-09-20 MED ORDER — LIDOCAINE-EPINEPHRINE (PF) 1.5 %-1:200000 IJ SOLN
INTRAMUSCULAR | Status: DC | PRN
  Administered 2015-09-20: 20 mL via PERINEURAL

## 2015-09-20 MED ORDER — HEPARIN SODIUM (PORCINE) 1000 UNIT/ML IJ SOLN
INTRAMUSCULAR | Status: DC | PRN
  Administered 2015-09-20: 5000 [IU] via INTRAVENOUS

## 2015-09-20 MED ORDER — PROTAMINE SULFATE 10 MG/ML IV SOLN
INTRAVENOUS | Status: AC
  Filled 2015-09-20: qty 5

## 2015-09-20 MED ORDER — BUPIVACAINE HCL (PF) 0.5 % IJ SOLN
INTRAMUSCULAR | Status: DC | PRN
  Administered 2015-09-20: 10 mL via PERINEURAL

## 2015-09-20 MED ORDER — FENTANYL CITRATE (PF) 100 MCG/2ML IJ SOLN
50.0000 ug | Freq: Once | INTRAMUSCULAR | Status: AC
  Administered 2015-09-20: 50 ug via INTRAVENOUS

## 2015-09-20 MED ORDER — SODIUM CHLORIDE 0.9 % IV SOLN
INTRAVENOUS | Status: DC | PRN
  Administered 2015-09-20: 500 mL

## 2015-09-20 MED ORDER — SODIUM CHLORIDE 0.9 % IV SOLN
INTRAVENOUS | Status: DC | PRN
  Administered 2015-09-20 (×2): via INTRAVENOUS

## 2015-09-20 MED ORDER — FENTANYL CITRATE (PF) 250 MCG/5ML IJ SOLN
INTRAMUSCULAR | Status: AC
  Filled 2015-09-20: qty 5

## 2015-09-20 MED ORDER — MIDAZOLAM HCL 2 MG/2ML IJ SOLN
INTRAMUSCULAR | Status: AC
  Administered 2015-09-20: 0.5 mg via INTRAVENOUS
  Filled 2015-09-20: qty 2

## 2015-09-20 MED ORDER — SODIUM CHLORIDE 0.9 % IV SOLN
INTRAVENOUS | Status: DC
  Administered 2015-09-20: 11:00:00 via INTRAVENOUS

## 2015-09-20 MED ORDER — FENTANYL CITRATE (PF) 100 MCG/2ML IJ SOLN
INTRAMUSCULAR | Status: DC | PRN
Start: 2015-09-20 — End: 2015-09-20
  Administered 2015-09-20: 50 ug via INTRAVENOUS

## 2015-09-20 MED ORDER — MIDAZOLAM HCL 2 MG/2ML IJ SOLN
INTRAMUSCULAR | Status: AC
  Filled 2015-09-20: qty 2

## 2015-09-20 MED ORDER — HEMOSTATIC AGENTS (NO CHARGE) OPTIME
TOPICAL | Status: DC | PRN
  Administered 2015-09-20 (×2): 1 via TOPICAL

## 2015-09-20 MED ORDER — CHLORHEXIDINE GLUCONATE CLOTH 2 % EX PADS: 6.0000 | MEDICATED_PAD | Freq: Once | CUTANEOUS | Status: DC

## 2015-09-20 MED ORDER — PROTAMINE SULFATE 10 MG/ML IV SOLN
INTRAVENOUS | Status: DC | PRN
  Administered 2015-09-20: 25 mg via INTRAVENOUS
  Administered 2015-09-20: 50 mg via INTRAVENOUS

## 2015-09-20 MED ORDER — HEPARIN SODIUM (PORCINE) 1000 UNIT/ML IJ SOLN
INTRAMUSCULAR | Status: AC
  Filled 2015-09-20: qty 1

## 2015-09-20 MED ORDER — LIDOCAINE-EPINEPHRINE (PF) 1 %-1:200000 IJ SOLN
INTRAMUSCULAR | Status: AC
  Filled 2015-09-20: qty 30

## 2015-09-20 MED ORDER — 0.9 % SODIUM CHLORIDE (POUR BTL) OPTIME
TOPICAL | Status: DC | PRN
  Administered 2015-09-20: 1000 mL

## 2015-09-20 MED ORDER — ONDANSETRON HCL 4 MG/2ML IJ SOLN
INTRAMUSCULAR | Status: DC | PRN
  Administered 2015-09-20: 4 mg via INTRAVENOUS

## 2015-09-20 SURGICAL SUPPLY — 34 items
ARMBAND PINK RESTRICT EXTREMIT (MISCELLANEOUS) ×3 IMPLANT
CANISTER SUCTION 2500CC (MISCELLANEOUS) ×3 IMPLANT
CLIP TI MEDIUM 6 (CLIP) ×3 IMPLANT
CLIP TI WIDE RED SMALL 6 (CLIP) ×3 IMPLANT
COVER PROBE W GEL 5X96 (DRAPES) ×1 IMPLANT
ELECT REM PT RETURN 9FT ADLT (ELECTROSURGICAL) ×3
ELECTRODE REM PT RTRN 9FT ADLT (ELECTROSURGICAL) ×1 IMPLANT
GLOVE BIO SURGEON STRL SZ 6.5 (GLOVE) ×1 IMPLANT
GLOVE BIO SURGEONS STRL SZ 6.5 (GLOVE) ×1
GLOVE BIOGEL PI IND STRL 6.5 (GLOVE) IMPLANT
GLOVE BIOGEL PI IND STRL 7.5 (GLOVE) ×1 IMPLANT
GLOVE BIOGEL PI INDICATOR 6.5 (GLOVE) ×4
GLOVE BIOGEL PI INDICATOR 7.5 (GLOVE) ×2
GLOVE ECLIPSE 6.5 STRL STRAW (GLOVE) ×2 IMPLANT
GLOVE SURG SS PI 7.5 STRL IVOR (GLOVE) ×3 IMPLANT
GOWN STRL REUS W/ TWL LRG LVL3 (GOWN DISPOSABLE) ×2 IMPLANT
GOWN STRL REUS W/ TWL XL LVL3 (GOWN DISPOSABLE) ×1 IMPLANT
GOWN STRL REUS W/TWL LRG LVL3 (GOWN DISPOSABLE) ×9
GOWN STRL REUS W/TWL XL LVL3 (GOWN DISPOSABLE) ×3
HEMOSTAT SNOW SURGICEL 2X4 (HEMOSTASIS) ×4 IMPLANT
KIT BASIN OR (CUSTOM PROCEDURE TRAY) ×3 IMPLANT
KIT ROOM TURNOVER OR (KITS) ×3 IMPLANT
LIQUID BAND (GAUZE/BANDAGES/DRESSINGS) ×3 IMPLANT
NS IRRIG 1000ML POUR BTL (IV SOLUTION) ×3 IMPLANT
PACK CV ACCESS (CUSTOM PROCEDURE TRAY) ×3 IMPLANT
PAD ARMBOARD 7.5X6 YLW CONV (MISCELLANEOUS) ×6 IMPLANT
SPONGE LAP 18X18 X RAY DECT (DISPOSABLE) ×2 IMPLANT
SUT PROLENE 5 0 C 1 24 (SUTURE) ×22 IMPLANT
SUT PROLENE 6 0 CC (SUTURE) ×3 IMPLANT
SUT VIC AB 3-0 SH 27 (SUTURE) ×6
SUT VIC AB 3-0 SH 27X BRD (SUTURE) ×1 IMPLANT
SUT VICRYL 4-0 PS2 18IN ABS (SUTURE) ×3 IMPLANT
UNDERPAD 30X30 INCONTINENT (UNDERPADS AND DIAPERS) ×3 IMPLANT
WATER STERILE IRR 1000ML POUR (IV SOLUTION) ×3 IMPLANT

## 2015-09-20 NOTE — Transfer of Care (Signed)
Immediate Anesthesia Transfer of Care Note  Patient: [name]  Procedure(s) Performed: Procedure(s): RESECTION and revision OF ARTERIOVENOUS FISTULA ANEURYSM (Left)  Patient Location: PACU  Anesthesia Type:MAC  Level of Consciousness: awake, oriented, sedated, patient cooperative and responds to stimulation  Airway & Oxygen Therapy: Patient Spontanous Breathing  Post-op Assessment: Report given to RN, Post -op Vital signs reviewed and stable, Patient moving all extremities and Patient moving all extremities X 4  Post vital signs: Reviewed and stable  Last Vitals:  Vitals:   09/20/15 0957  BP: (!) 189/46  Pulse: (!) 41  Resp: 18  Temp: 36.6 C    Last Pain:  Vitals:   09/20/15 0957  TempSrc: Oral      Patients Stated Pain Goal: 3 ([id number])  Complications: No apparent anesthesia complications

## 2015-09-20 NOTE — Anesthesia Preprocedure Evaluation (Signed)
Anesthesia Evaluation  Patient identified by MRN, date of birth, ID band Patient awake    Reviewed: Allergy & Precautions, NPO status , Patient's Chart, lab work & pertinent test results  Airway Mallampati: II  TM Distance: >3 FB Neck ROM: Full    Dental no notable dental hx.    Pulmonary neg pulmonary ROS,    Pulmonary exam normal breath sounds clear to auscultation       Cardiovascular hypertension, + dysrhythmias Atrial Fibrillation  Rhythm:Irregular Rate:Normal     Neuro/Psych negative neurological ROS  negative psych ROS   GI/Hepatic negative GI ROS, Neg liver ROS,   Endo/Other  diabetes  Renal/GU DialysisRenal disease  negative genitourinary   Musculoskeletal negative musculoskeletal ROS (+)   Abdominal   Peds negative pediatric ROS (+)  Hematology negative hematology ROS (+)   Anesthesia Other Findings   Reproductive/Obstetrics negative OB ROS                             Anesthesia Physical Anesthesia Plan  ASA: III  Anesthesia Plan: Regional and MAC   Post-op Pain Management:    Induction: Intravenous  Airway Management Planned: Simple Face Mask  Additional Equipment:   Intra-op Plan:   Post-operative Plan:   Informed Consent: I have reviewed the patients History and Physical, chart, labs and discussed the procedure including the risks, benefits and alternatives for the proposed anesthesia with the patient or authorized representative who has indicated his/her understanding and acceptance.   Dental advisory given  Plan Discussed with: CRNA and Surgeon  Anesthesia Plan Comments:         Anesthesia Quick Evaluation

## 2015-09-20 NOTE — Anesthesia Procedure Notes (Signed)
Anesthesia Regional Block:  Supraclavicular block  Pre-Anesthetic Checklist: ,, timeout performed, Correct Patient, Correct Site, Correct Laterality, Correct Procedure, Correct Position, site marked, Risks and benefits discussed,  Surgical consent,  Pre-op evaluation,  At surgeon's request and post-op pain management  Laterality: Left  Prep: chloraprep       Needles:  Injection technique: Single-shot  Needle Type: Echogenic Needle     Needle Length: 9cm 9 cm Needle Gauge: 21 G    Additional Needles:  Procedures: ultrasound guided (picture in chart) Supraclavicular block Narrative:  Injection made incrementally with aspirations every 5 mL.  Performed by: Personally  Anesthesiologist: Beulah Matusek  Additional Notes: Patient tolerated the procedure well without complications

## 2015-09-20 NOTE — Interval H&P Note (Signed)
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 After consideration of risks, benefits and other options for treatment, the patient has consented to  Procedure(s): RESECTION OF ARTERIOVENOUS FISTULA ANEURYSM (Left) as a surgical intervention .  The patient's history has been reviewed, patient examined, no change in status, stable for surgery.  I have reviewed the patient's chart and labs.  Questions were answered to the patient's satisfaction.     Annamarie Major

## 2015-09-20 NOTE — Progress Notes (Signed)
Orthopedic Tech Progress Note Patient Details:  [name] 1940/04/04 [id number]  Ortho Devices Type of Ortho Device: Arm sling Ortho Device/Splint Location: LUE Ortho Device/Splint Interventions: Ordered, Application   Braulio Bosch 09/20/2015, 3:32 PM

## 2015-09-20 NOTE — H&P (View-Only) (Signed)
[facility]ist of [place]  Patient name: [name] MRN: [id number] DOB: January 12, 1941 Sex: female  REFERRING PHYSICIAN: Dr. Mercy Moore  REASON FOR CONSULT: Fistula aneurysm  HPI: [name] is a 75 y.o. female, who had a left brachiocephalic fistula created approximately 10 years ago.  She is on dialysis Tuesday Thursday Saturday.  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  There are no scabs or ulcers.  She does have pain with cannulation.  The patient is medically managed for diabetes and hypertension.  She has atrial fibrillation.  She is not on anticoagulation, just aspirin.  Past Medical History  Diagnosis Date  . ESRD on dialysis [facility])     dialysis  . Hypertension   . Diabetes mellitus   . Hyperparathyroidism, secondary ([place])   . Atrial fibrillation (HCC)     on ASA  . Pulmonary HTN ([place])   . Blind 2005  . Non-Hodgkin lymphoma ([place]) 2007  . Bladder cancer [facility]) Jan 10, 2006    BLADDER NECK, BIOPSY: DIFFUSE LARGE B-CELL LYMPHOMA. CT showed worrisome pelvic LN. No treatment records in EPIC.    Family History  Problem Relation Age of Onset  . Thyroid disease Mother   . Lung cancer Father   . Colon cancer Neg Hx   . Breast cancer Neg Hx     SOCIAL HISTORY: Social History   Social History  . Marital Status: Married    Spouse Name: N/A  . Number of Children: 8  . Years of Education: N/A   Occupational History  . Disabled    Social History Main Topics  . Smoking status: Never Smoker   . Smokeless tobacco: Never Used  . Alcohol Use: No  . Drug Use: No  . Sexual Activity: Not on file   Other Topics Concern  . Not on file   Social History Narrative   Original from [place]    In [place] since ~ 2006   household -- husband       Communicate with daughter-in-law Murray Hodgkins [phone]    No Known Allergies  Current Outpatient Prescriptions  Medication Sig Dispense Refill  . acetaminophen  (TYLENOL) 325 MG tablet Take 650 mg by mouth every 6 (six) hours as needed.    . diphenhydrAMINE-zinc acetate (BENADRYL) cream Apply topically 3 (three) times daily as needed for itching. 28.3 g 0  . glipiZIDE (GLUCOTROL) 10 MG tablet Take 10 mg by mouth daily before breakfast.    . labetalol (NORMODYNE) 200 MG tablet Take 200 mg by mouth 2 (two) times daily.    Marland Kitchen omeprazole (PRILOSEC) 20 MG capsule Take 40 mg by mouth daily.     . SENSIPAR 60 MG tablet     . sorbitol 70 % solution take 30 milliliters by mouth once daily if needed for constipation 300 mL 0   No current facility-administered medications for this visit.    REVIEW OF SYSTEMS:  [X]  denotes positive finding, [ ]  denotes negative finding Cardiac  Comments:  Chest pain or chest pressure: x   Shortness of breath upon exertion:    Short of breath when lying flat:    Irregular heart rhythm:        Vascular    Pain in calf, thigh, or hip brought on by ambulation:    Pain in feet at night that wakes you up from your sleep:     Blood clot in your veins:    Leg swelling:  x  Pulmonary    Oxygen at home:    Productive cough:     Wheezing:         Neurologic    Sudden weakness in arms or legs:     Sudden numbness in arms or legs:  x   Sudden onset of difficulty speaking or slurred speech:    Temporary loss of vision in one eye:  x   Problems with dizziness:  x       Gastrointestinal    Blood in stool:     Vomited blood:         Genitourinary    Burning when urinating:     Blood in urine:        Psychiatric    Major depression:         Hematologic    Bleeding problems:    Problems with blood clotting too easily:        Skin    Rashes or ulcers:        Constitutional    Fever or chills:      PHYSICAL EXAM: Filed Vitals:   08/30/15 0910 08/30/15 0914  BP: 155/54 148/52  Pulse: 53   Temp: 97.5 F (36.4 C)   TempSrc: Oral   Resp: 18   Height: 4' 11.5" (1.511 m)   Weight: 113 lb 6.4 oz (51.438 kg)    SpO2: 100%     GENERAL: The patient is a well-nourished female, in no acute distress. The vital signs are documented above. CARDIAC: There is a regular rate and rhythm.  VASCULAR: 2 large pseudoaneurysms off of the left brachiocephalic fistula.  There is skin thickening but no breakdown. PULMONARY: There is good air exchange bilaterally without wheezing or rales..  MUSCULOSKELETAL: There are no major deformities or cyanosis. NEUROLOGIC: No focal weakness or paresthesias are detected.  Vision impairment SKIN: There are no ulcers or rashes noted. PSYCHIATRIC: The patient has a normal affect.  DATA:  I have ordered and reviewed her fistula duplex.  This shows pseudoaneurysms in the fistula and plaque at the anastomosis.  MEDICAL ISSUES: Dialysis fistula pseudoaneurysm: The patient is having pain with cannulation.  I have recommended proceeding with plication of both of her aneurysms.  I feel that there is enough room and her cephalic vein proximal to the aneurysms to continue with dialysis in the upper arm.  I did discuss that if they were unable to cannulate the vein at this level that she would require a catheter for proximally one month while the incisions healed.  All of her questions were answered.  Her surgery has been scheduled for Wednesday, August 9.  This conversation occurred with the presence of a Spanish interpreter.   Annamarie Major, MD [facility] [phone] Pager [phone]

## 2015-09-20 NOTE — Op Note (Signed)
    Patient name: [name] MRN: [id number] DOB: [birth year] Sex: female  09/20/2015 Pre-operative Diagnosis: L upper arm AV fistula aneurysm Post-operative diagnosis:  Same Surgeon:  Annamarie Major Assistants:  Silva Bandy Procedure:   Aneurysmorrhaphy 2 of left brachiocephalic fistula Anesthesia:  Arm block Blood Loss:  See anesthesia record Specimens:  None  Findings:  2 separate focal aneurysmal changes to the brachiocephalic fistula  Indications:  The patient presented with 2 large areas of aneurysmal change over her left brachiocephalic fistula.  These have been getting larger in size and are becoming more painful.  She comes in today for further treatment with aneurysmorrhaphy.  Procedure:  The patient was identified in the holding area and taken to [facility]  The patient was then placed supine on the table. regional anesthesia was administered.  The patient was prepped and draped in the usual sterile fashion.  A time out was called and antibiotics were administered.  Incision was made directly anterior to the 2 areas of aneurysmal change.  Using Bovie cautery, the fistula and the aneurysmal components were circumferentially mobilized.  There were 2 focal aneurysms.  Once I had adequate exposure as well as proximal distal control, the patient was fully heparinized.  After the heparin circulated the fistula was occluded.  It did take both a Hanley clamp and a peripheral DeBakeys clamp to occlude the fistula.  Once this was done an 11 blade was used to make a venotomy over the first more distal aneurysm.  This was opened with Potts scissors.  Approximately 50% of the diameter was resected and then sewn end to end with running 5-0 Prolene.  Several repair stitches were required for hemostasis.  Next, attention was turned towards the more proximal aneurysm.  Again this was opened with a #11 blade anteriorly.  Approximately 50% of the fistula was resected with Potts scissors.  The vein was  then closed with a running 5-0 Prolene.  Clamps were released.  Multiple sutures were required for hemostasis.  75 mg of protamine was given.  Thrombus and copiously irrigated.  I then resected the redundant skin.  Once hemostasis was satisfactory the subcutaneous tissue was closed with 3-0 Vicryl followed by 4-0 Vicryl and Dermabond on the skin.  There was an excellent thrill in the fistula procedure.  No immediate complications.   Disposition:  To PACU in stable condition.   Theotis Burrow, M.D. [facility] [place] Office: [phone] Pager:  [phone]

## 2015-09-21 ENCOUNTER — Telehealth: Payer: Self-pay | Admitting: Surgery

## 2015-09-21 ENCOUNTER — Encounter (HOSPITAL_COMMUNITY): Payer: Self-pay | Admitting: Surgery

## 2015-09-21 NOTE — Anesthesia Postprocedure Evaluation (Signed)
Anesthesia Post Note  Patient: [name]  Procedure(s) Performed: Procedure(s) (LRB): RESECTION and revision OF ARTERIOVENOUS FISTULA ANEURYSM (Left)  Patient location during evaluation: PACU Anesthesia Type: MAC and Regional Level of consciousness: awake and alert Pain management: pain level controlled Vital Signs Assessment: post-procedure vital signs reviewed and stable Respiratory status: spontaneous breathing, nonlabored ventilation, respiratory function stable and patient connected to nasal cannula oxygen Cardiovascular status: stable and blood pressure returned to baseline Anesthetic complications: no    Last Vitals:  Vitals:   09/20/15 1445 09/20/15 1452  BP: (!) 143/47 140/65  Pulse: (!) 54 (!) 50  Resp: 14 16  Temp: 36.5 C     Last Pain:  Vitals:   09/20/15 1452  TempSrc:   PainSc: 0-No pain                 Pawan Knechtel S

## 2015-09-21 NOTE — Telephone Encounter (Signed)
Sched appt 8/28 at 3:30. Spoke to pt's son and lm on his ph# as per his request.

## 2015-09-21 NOTE — Telephone Encounter (Signed)
-----   Message from Mena Goes, RN sent at 09/20/2015  4:04 PM EDT ----- Regarding: schedule 2-3- weeks    ----- Message ----- From: Serafina Mitchell, MD Sent: 09/20/2015   2:01 PM To: Vvs Charge Pool  09/20/2015:  Surgeon:  Annamarie Major Assistants:  Silva Bandy Procedure:   Aneurysmorrhaphy 2 of left brachiocephalic fistula   Follow-up 2-3 weeks

## 2015-10-02 ENCOUNTER — Telehealth: Payer: Self-pay | Admitting: *Deleted

## 2015-10-02 NOTE — Telephone Encounter (Signed)
Patient's son called to report that she is having increased pain when the [facility] cannulates the AVF She had Plication X 2 on [id number] and this area can not be cannulated for 1 month per Dr. Trula Slade. I called Seth Bake at [facility] [phone], and she did say the patient can only be stuck very high up on the left arm, almost to the shoulder and that it is giving her pain. I spoke to Dr. Trula Slade and he said that if the patient continues to have extreme pain in the area, we will put a HD access in her chest and let th AVF rest. The [facility] will notify us if the access placement is needed.

## 2015-10-04 ENCOUNTER — Encounter: Payer: Self-pay | Admitting: Surgery

## 2015-10-09 ENCOUNTER — Ambulatory Visit (INDEPENDENT_AMBULATORY_CARE_PROVIDER_SITE_OTHER): Payer: Medicare Other | Admitting: Surgery

## 2015-10-09 ENCOUNTER — Encounter: Payer: Self-pay | Admitting: Surgery

## 2015-10-09 VITALS — BP 141/44 | HR 54 | Temp 98.1°F | Resp 14 | Ht 59.0 in | Wt 119.0 lb

## 2015-10-09 DIAGNOSIS — N186 End stage renal disease: Secondary | ICD-10-CM

## 2015-10-09 DIAGNOSIS — Z992 Dependence on renal dialysis: Secondary | ICD-10-CM

## 2015-10-09 NOTE — Progress Notes (Signed)
Vitals:   10/09/15 1602  BP: (!) 144/48  Pulse: (!) 54  Resp: 14  Temp: 98.1 F (36.7 C)  SpO2: 99%  Weight: 119 lb 0.8 oz (54 kg)  Height: 4\' 11"  (1.499 m)

## 2015-10-09 NOTE — Progress Notes (Signed)
   Patient name: [name] MRN: [id number] DOB: Jun 28, 1940 Sex: female  REASON FOR VISIT: post-op  HPI: [name] is a 75 y.o. female who is status post aneurysmorrhaphy 2 of her left brachiocephalic fistula on [id number].  She has not required a catheter and is being cannulated in her shoulder area.  She reports no drainage.  Current Outpatient Prescriptions  Medication Sig Dispense Refill  . acetaminophen (TYLENOL) 325 MG tablet Take 650 mg by mouth every 6 (six) hours as needed for moderate pain, fever or headache.     . diphenhydrAMINE-zinc acetate (BENADRYL) cream Apply 1 application topically 3 (three) times daily as needed for itching.    Marland Kitchen glipiZIDE (GLUCOTROL) 5 MG tablet Take 5 mg by mouth 2 (two) times daily.  0  . labetalol (NORMODYNE) 200 MG tablet Take 200 mg by mouth 2 (two) times daily.    Marland Kitchen omeprazole (PRILOSEC) 20 MG capsule Take 20 mg by mouth daily.     Marland Kitchen oxyCODONE-acetaminophen (ROXICET) 5-325 MG tablet Take 1-2 tablets by mouth every 6 (six) hours as needed for severe pain. 20 tablet 0  . SENSIPAR 60 MG tablet Take 60 mg by mouth 2 (two) times daily.     . sorbitol 70 % solution take 30 milliliters by mouth once daily if needed for constipation 300 mL 0  . diphenhydrAMINE-zinc acetate (BENADRYL) cream Apply topically 3 (three) times daily as needed for itching. (Patient not taking: Reported on 10/09/2015) 28.3 g 0   No current facility-administered medications for this visit.     REVIEW OF SYSTEMS:  [X]  denotes positive finding, [ ]  denotes negative finding Cardiac  Comments:  Chest pain or chest pressure:    Shortness of breath upon exertion:    Short of breath when lying flat:    Irregular heart rhythm:    Constitutional    Fever or chills:      PHYSICAL EXAM: Vitals:   10/09/15 1602 10/09/15 1603  BP: (!) 144/48 (!) 141/44  Pulse: (!) 54 (!) 54  Resp: 14   Temp: 98.1 F (36.7 C)   SpO2: 99%   Weight: 119  lb 0.8 oz (54 kg)   Height: 4\' 11"  (1.499 m)     GENERAL: The patient is a well-nourished female, in no acute distress. The vital signs are documented above. CARDIOVASCULAR: There is a regular rate and rhythm. PULMONARY: There is good air exchange bilaterally without wheezing or rales. Excellent thrill within the fistula.  The incision is healing nicely.  No evidence of infection  MEDICAL ISSUES: The above visit was conducted with the assistance of a Spanish interpreter.  Her fistula will be ready for use on September 9.  She will follow with me on an as-needed basis.  Annamarie Major, MD [facility] [phone] Pager [phone]

## 2015-10-31 ENCOUNTER — Other Ambulatory Visit: Payer: Self-pay | Admitting: *Deleted

## 2015-11-03 ENCOUNTER — Encounter: Payer: Self-pay | Admitting: Gastroenterology

## 2015-11-10 ENCOUNTER — Ambulatory Visit (HOSPITAL_COMMUNITY)
Admission: EM | Admit: 2015-11-10 | Discharge: 2015-11-10 | Disposition: A | Discharge status: Home / Self Care | Payer: Medicare Other | Source: Home / Self Care | Attending: Internal Medicine | Admitting: Internal Medicine

## 2015-11-10 ENCOUNTER — Encounter (HOSPITAL_COMMUNITY)
Admission: EM | Disposition: A | Discharge status: Home / Self Care | Payer: Self-pay | Source: Home / Self Care | Attending: Internal Medicine

## 2015-11-10 ENCOUNTER — Inpatient Hospital Stay (HOSPITAL_COMMUNITY)
Admission: EM | Admit: 2015-11-10 | Discharge: 2015-11-13 | DRG: 252 | Disposition: A | Discharge status: Home / Self Care | Payer: Medicare Other | Attending: Internal Medicine | Admitting: Internal Medicine

## 2015-11-10 ENCOUNTER — Inpatient Hospital Stay (HOSPITAL_COMMUNITY): Payer: Medicare Other | Admitting: Anesthesiology

## 2015-11-10 ENCOUNTER — Encounter (HOSPITAL_COMMUNITY): Payer: Self-pay | Admitting: Emergency Medicine

## 2015-11-10 DIAGNOSIS — K7581 Nonalcoholic steatohepatitis (NASH): Secondary | ICD-10-CM | POA: Diagnosis not present

## 2015-11-10 DIAGNOSIS — R001 Bradycardia, unspecified: Secondary | ICD-10-CM | POA: Diagnosis present

## 2015-11-10 DIAGNOSIS — M79601 Pain in right arm: Secondary | ICD-10-CM | POA: Diagnosis present

## 2015-11-10 DIAGNOSIS — R011 Cardiac murmur, unspecified: Secondary | ICD-10-CM | POA: Diagnosis present

## 2015-11-10 DIAGNOSIS — N2581 Secondary hyperparathyroidism of renal origin: Secondary | ICD-10-CM | POA: Diagnosis not present

## 2015-11-10 DIAGNOSIS — I4891 Unspecified atrial fibrillation: Secondary | ICD-10-CM | POA: Diagnosis present

## 2015-11-10 DIAGNOSIS — I742 Embolism and thrombosis of arteries of the upper extremities: Principal | ICD-10-CM | POA: Diagnosis present

## 2015-11-10 DIAGNOSIS — Z7984 Long term (current) use of oral hypoglycemic drugs: Secondary | ICD-10-CM | POA: Diagnosis not present

## 2015-11-10 DIAGNOSIS — K7460 Unspecified cirrhosis of liver: Secondary | ICD-10-CM | POA: Diagnosis present

## 2015-11-10 DIAGNOSIS — D509 Iron deficiency anemia, unspecified: Secondary | ICD-10-CM | POA: Diagnosis present

## 2015-11-10 DIAGNOSIS — I071 Rheumatic tricuspid insufficiency: Secondary | ICD-10-CM | POA: Diagnosis present

## 2015-11-10 DIAGNOSIS — E1122 Type 2 diabetes mellitus with diabetic chronic kidney disease: Secondary | ICD-10-CM | POA: Diagnosis present

## 2015-11-10 DIAGNOSIS — Z992 Dependence on renal dialysis: Secondary | ICD-10-CM | POA: Diagnosis not present

## 2015-11-10 DIAGNOSIS — N186 End stage renal disease: Secondary | ICD-10-CM | POA: Diagnosis not present

## 2015-11-10 DIAGNOSIS — Z923 Personal history of irradiation: Secondary | ICD-10-CM | POA: Diagnosis not present

## 2015-11-10 DIAGNOSIS — I749 Embolism and thrombosis of unspecified artery: Secondary | ICD-10-CM

## 2015-11-10 DIAGNOSIS — C8599 Non-Hodgkin lymphoma, unspecified, extranodal and solid organ sites: Secondary | ICD-10-CM | POA: Diagnosis present

## 2015-11-10 DIAGNOSIS — Z9181 History of falling: Secondary | ICD-10-CM

## 2015-11-10 DIAGNOSIS — Z8551 Personal history of malignant neoplasm of bladder: Secondary | ICD-10-CM | POA: Diagnosis not present

## 2015-11-10 DIAGNOSIS — T448X5A Adverse effect of centrally-acting and adrenergic-neuron-blocking agents, initial encounter: Secondary | ICD-10-CM | POA: Diagnosis present

## 2015-11-10 DIAGNOSIS — I998 Other disorder of circulatory system: Secondary | ICD-10-CM

## 2015-11-10 DIAGNOSIS — I70208 Unspecified atherosclerosis of native arteries of extremities, other extremity: Secondary | ICD-10-CM

## 2015-11-10 DIAGNOSIS — M79641 Pain in right hand: Secondary | ICD-10-CM

## 2015-11-10 DIAGNOSIS — H547 Unspecified visual loss: Secondary | ICD-10-CM | POA: Diagnosis present

## 2015-11-10 DIAGNOSIS — E119 Type 2 diabetes mellitus without complications: Secondary | ICD-10-CM

## 2015-11-10 DIAGNOSIS — I120 Hypertensive chronic kidney disease with stage 5 chronic kidney disease or end stage renal disease: Secondary | ICD-10-CM | POA: Diagnosis present

## 2015-11-10 DIAGNOSIS — I10 Essential (primary) hypertension: Secondary | ICD-10-CM | POA: Diagnosis present

## 2015-11-10 HISTORY — DX: Liver disease, unspecified: K76.9

## 2015-11-10 HISTORY — PX: THROMBECTOMY BRACHIAL ARTERY: SHX6649

## 2015-11-10 LAB — COMPREHENSIVE METABOLIC PANEL
ALBUMIN: 3.1 g/dL — AB (ref 3.5–5.0)
ALK PHOS: 81 U/L (ref 38–126)
ALT: 13 U/L — AB (ref 14–54)
ANION GAP: 10 (ref 5–15)
AST: 25 U/L (ref 15–41)
BILIRUBIN TOTAL: 0.6 mg/dL (ref 0.3–1.2)
BUN: 21 mg/dL — AB (ref 6–20)
CALCIUM: 10.7 mg/dL — AB (ref 8.9–10.3)
CO2: 28 mmol/L (ref 22–32)
CREATININE: 4.18 mg/dL — AB (ref 0.44–1.00)
Chloride: 97 mmol/L — ABNORMAL LOW (ref 101–111)
GFR calc Af Amer: 11 mL/min — ABNORMAL LOW (ref >60)
GFR calc non Af Amer: 10 mL/min — ABNORMAL LOW (ref >60)
GLUCOSE: 173 mg/dL — AB (ref 65–99)
Potassium: 3.2 mmol/L — ABNORMAL LOW (ref 3.5–5.1)
SODIUM: 135 mmol/L (ref 135–145)
Total Protein: 6.9 g/dL (ref 6.5–8.1)

## 2015-11-10 LAB — CBC WITH DIFFERENTIAL/PLATELET
BASOS ABS: 0 10*3/uL (ref 0.0–0.1)
BASOS PCT: 1 %
EOS ABS: 0.2 10*3/uL (ref 0.0–0.7)
Eosinophils Relative: 4 %
HEMATOCRIT: 32.2 % — AB (ref 36.0–46.0)
HEMOGLOBIN: 10.4 g/dL — AB (ref 12.0–15.0)
Lymphocytes Relative: 34 %
Lymphs Abs: 1.7 10*3/uL (ref 0.7–4.0)
MCH: 33.8 pg (ref 26.0–34.0)
MCHC: 32.3 g/dL (ref 30.0–36.0)
MCV: 104.5 fL — ABNORMAL HIGH (ref 78.0–100.0)
Monocytes Absolute: 0.6 10*3/uL (ref 0.1–1.0)
Monocytes Relative: 13 %
NEUTROS ABS: 2.4 10*3/uL (ref 1.7–7.7)
NEUTROS PCT: 48 %
Platelets: 226 10*3/uL (ref 150–400)
RBC: 3.08 MIL/uL — AB (ref 3.87–5.11)
RDW: 14.4 % (ref 11.5–15.5)
WBC: 4.9 10*3/uL (ref 4.0–10.5)

## 2015-11-10 LAB — TYPE AND SCREEN
ABO/RH(D): O POS
Antibody Screen: NEGATIVE

## 2015-11-10 LAB — CG4 I-STAT (LACTIC ACID): Lactic Acid, Venous: 1.29 mmol/L (ref 0.5–1.9)

## 2015-11-10 LAB — PROTIME-INR
INR: 1.21
Prothrombin Time: 15.3 seconds — ABNORMAL HIGH (ref 11.4–15.2)

## 2015-11-10 LAB — APTT: aPTT: 24 seconds (ref 24–36)

## 2015-11-10 LAB — GLUCOSE, CAPILLARY
Glucose-Capillary: 174 mg/dL — ABNORMAL HIGH (ref 65–99)
Glucose-Capillary: 164 mg/dL — ABNORMAL HIGH (ref 65–99)

## 2015-11-10 SURGERY — THROMBECTOMY, ARTERY, BRACHIAL
Anesthesia: Monitor Anesthesia Care | Laterality: Right

## 2015-11-10 MED ORDER — SODIUM CHLORIDE 0.9 % IV SOLN
INTRAVENOUS | Status: DC | PRN
  Administered 2015-11-10: 17:00:00 via INTRAVENOUS

## 2015-11-10 MED ORDER — SODIUM CHLORIDE 0.9 % IV SOLN
INTRAVENOUS | Status: DC
  Administered 2015-11-10: 23:00:00 via INTRAVENOUS

## 2015-11-10 MED ORDER — INSULIN ASPART 100 UNIT/ML ~~LOC~~ SOLN: 0.0000 [IU] | Freq: Three times a day (TID) | SUBCUTANEOUS | Status: DC

## 2015-11-10 MED ORDER — POLYETHYLENE GLYCOL 3350 17 G PO PACK: 17.0000 g | PACK | Freq: Every day | ORAL | Status: DC | PRN

## 2015-11-10 MED ORDER — BISACODYL 10 MG RE SUPP: 10.0000 mg | Freq: Every day | RECTAL | Status: DC | PRN

## 2015-11-10 MED ORDER — CEFAZOLIN SODIUM 1 G IJ SOLR
INTRAMUSCULAR | Status: AC
  Filled 2015-11-10: qty 10

## 2015-11-10 MED ORDER — ALUM & MAG HYDROXIDE-SIMETH 200-200-20 MG/5ML PO SUSP: 15.0000 mL | ORAL | Status: DC | PRN

## 2015-11-10 MED ORDER — ONDANSETRON HCL 4 MG/2ML IJ SOLN
4.0000 mg | Freq: Four times a day (QID) | INTRAMUSCULAR | Status: DC | PRN
  Filled 2015-11-10 (×2): qty 2

## 2015-11-10 MED ORDER — METOPROLOL TARTRATE 5 MG/5ML IV SOLN: 2.0000 mg | INTRAVENOUS | Status: DC | PRN

## 2015-11-10 MED ORDER — ALBUMIN HUMAN 5 % IV SOLN
INTRAVENOUS | Status: DC | PRN
  Administered 2015-11-10: 18:00:00 via INTRAVENOUS

## 2015-11-10 MED ORDER — BUPIVACAINE HCL (PF) 0.5 % IJ SOLN
INTRAMUSCULAR | Status: AC
  Filled 2015-11-10: qty 30

## 2015-11-10 MED ORDER — LIDOCAINE-EPINEPHRINE (PF) 1 %-1:200000 IJ SOLN
INTRAMUSCULAR | Status: DC | PRN
  Administered 2015-11-10: 4 mL

## 2015-11-10 MED ORDER — SUCCINYLCHOLINE CHLORIDE 200 MG/10ML IV SOSY
PREFILLED_SYRINGE | INTRAVENOUS | Status: AC
  Filled 2015-11-10: qty 10

## 2015-11-10 MED ORDER — FENTANYL CITRATE (PF) 100 MCG/2ML IJ SOLN: 25.0000 ug | INTRAMUSCULAR | Status: DC | PRN

## 2015-11-10 MED ORDER — PHENOL 1.4 % MT LIQD: 1.0000 | OROMUCOSAL | Status: DC | PRN

## 2015-11-10 MED ORDER — LABETALOL HCL 5 MG/ML IV SOLN: 10.0000 mg | INTRAVENOUS | Status: DC | PRN

## 2015-11-10 MED ORDER — HEPARIN SODIUM (PORCINE) 1000 UNIT/ML IJ SOLN
INTRAMUSCULAR | Status: DC | PRN
  Administered 2015-11-10: 6000 [IU] via INTRAVENOUS

## 2015-11-10 MED ORDER — SODIUM CHLORIDE 0.9 % IV SOLN: 500.0000 mL | Freq: Once | INTRAVENOUS | Status: DC | PRN

## 2015-11-10 MED ORDER — FENTANYL CITRATE (PF) 100 MCG/2ML IJ SOLN
INTRAMUSCULAR | Status: DC | PRN
  Administered 2015-11-10: 50 ug via INTRAVENOUS

## 2015-11-10 MED ORDER — 0.9 % SODIUM CHLORIDE (POUR BTL) OPTIME
TOPICAL | Status: DC | PRN
  Administered 2015-11-10: 1000 mL

## 2015-11-10 MED ORDER — HEPARIN (PORCINE) IN NACL 100-0.45 UNIT/ML-% IJ SOLN
1050.0000 [IU]/h | INTRAMUSCULAR | Status: DC
  Administered 2015-11-10: 900 [IU]/h via INTRAVENOUS
  Filled 2015-11-10 (×3): qty 250

## 2015-11-10 MED ORDER — SORBITOL 70 % SOLN
30.0000 mL | Status: DC | PRN
  Administered 2015-11-11: 30 mL via ORAL
  Filled 2015-11-10: qty 30

## 2015-11-10 MED ORDER — CALCIUM CARBONATE 1250 MG/5ML PO SUSP
500.0000 mg | Freq: Four times a day (QID) | ORAL | Status: DC | PRN
  Filled 2015-11-10: qty 5

## 2015-11-10 MED ORDER — HEPARIN SODIUM (PORCINE) 1000 UNIT/ML IJ SOLN
INTRAMUSCULAR | Status: AC
  Filled 2015-11-10: qty 1

## 2015-11-10 MED ORDER — CAMPHOR-MENTHOL 0.5-0.5 % EX LOTN: 1.0000 "application " | TOPICAL_LOTION | Freq: Three times a day (TID) | CUTANEOUS | Status: DC | PRN

## 2015-11-10 MED ORDER — ACETAMINOPHEN 325 MG PO TABS
325.0000 mg | ORAL_TABLET | ORAL | Status: DC | PRN
  Administered 2015-11-13 (×2): 650 mg via ORAL
  Filled 2015-11-10 (×2): qty 2

## 2015-11-10 MED ORDER — DOCUSATE SODIUM 100 MG PO CAPS: 100.0000 mg | ORAL_CAPSULE | Freq: Every day | ORAL | Status: DC

## 2015-11-10 MED ORDER — SODIUM CHLORIDE 0.9 % IV SOLN
INTRAVENOUS | Status: DC
  Administered 2015-11-10: 17:00:00 via INTRAVENOUS

## 2015-11-10 MED ORDER — OXYCODONE-ACETAMINOPHEN 5-325 MG PO TABS
1.0000 | ORAL_TABLET | ORAL | Status: DC | PRN
  Administered 2015-11-11: 2 via ORAL
  Administered 2015-11-12: 1 via ORAL
  Filled 2015-11-10: qty 2
  Filled 2015-11-10: qty 1

## 2015-11-10 MED ORDER — CINACALCET HCL 30 MG PO TABS
60.0000 mg | ORAL_TABLET | Freq: Two times a day (BID) | ORAL | Status: DC
  Administered 2015-11-11 – 2015-11-13 (×5): 60 mg via ORAL
  Filled 2015-11-10 (×5): qty 2

## 2015-11-10 MED ORDER — GUAIFENESIN-DM 100-10 MG/5ML PO SYRP: 15.0000 mL | ORAL_SOLUTION | ORAL | Status: DC | PRN

## 2015-11-10 MED ORDER — ACETAMINOPHEN 325 MG RE SUPP: 325.0000 mg | RECTAL | Status: DC | PRN

## 2015-11-10 MED ORDER — HYDRALAZINE HCL 20 MG/ML IJ SOLN: 5.0000 mg | INTRAMUSCULAR | Status: DC | PRN

## 2015-11-10 MED ORDER — LIDOCAINE HCL (CARDIAC) 20 MG/ML IV SOLN
INTRAVENOUS | Status: DC | PRN
  Administered 2015-11-10: 20 mg via INTRAVENOUS

## 2015-11-10 MED ORDER — PROPOFOL 500 MG/50ML IV EMUL
INTRAVENOUS | Status: DC | PRN
  Administered 2015-11-10: 50 ug/kg/min via INTRAVENOUS

## 2015-11-10 MED ORDER — LABETALOL HCL 200 MG PO TABS
200.0000 mg | ORAL_TABLET | Freq: Two times a day (BID) | ORAL | Status: DC
  Filled 2015-11-10: qty 1

## 2015-11-10 MED ORDER — FENTANYL CITRATE (PF) 100 MCG/2ML IJ SOLN
INTRAMUSCULAR | Status: AC
  Filled 2015-11-10: qty 4

## 2015-11-10 MED ORDER — SODIUM CHLORIDE 0.9 % IV SOLN
INTRAVENOUS | Status: DC | PRN
  Administered 2015-11-10: 500 mL

## 2015-11-10 MED ORDER — PHENYLEPHRINE HCL 10 MG/ML IJ SOLN
INTRAMUSCULAR | Status: DC | PRN
  Administered 2015-11-10: 40 ug via INTRAVENOUS
  Administered 2015-11-10 (×2): 80 ug via INTRAVENOUS

## 2015-11-10 MED ORDER — PHENYLEPHRINE HCL 10 MG/ML IJ SOLN
INTRAVENOUS | Status: DC | PRN
  Administered 2015-11-10: 25 ug/min via INTRAVENOUS

## 2015-11-10 MED ORDER — LIDOCAINE 2% (20 MG/ML) 5 ML SYRINGE
INTRAMUSCULAR | Status: AC
  Filled 2015-11-10: qty 5

## 2015-11-10 MED ORDER — MIDAZOLAM HCL 2 MG/2ML IJ SOLN
INTRAMUSCULAR | Status: AC
  Filled 2015-11-10: qty 2

## 2015-11-10 MED ORDER — HYDROXYZINE HCL 25 MG PO TABS
25.0000 mg | ORAL_TABLET | Freq: Three times a day (TID) | ORAL | Status: DC | PRN
Start: 2015-11-10 — End: 2015-11-13

## 2015-11-10 MED ORDER — LIDOCAINE-EPINEPHRINE (PF) 1 %-1:200000 IJ SOLN
INTRAMUSCULAR | Status: AC
  Filled 2015-11-10: qty 30

## 2015-11-10 MED ORDER — BUPIVACAINE HCL 0.5 % IJ SOLN
INTRAMUSCULAR | Status: DC | PRN
  Administered 2015-11-10: 4 mL

## 2015-11-10 MED ORDER — CEFAZOLIN IN D5W 1 GM/50ML IV SOLN
INTRAVENOUS | Status: DC | PRN
  Administered 2015-11-10: 1 g via INTRAVENOUS

## 2015-11-10 MED ORDER — ZOLPIDEM TARTRATE 5 MG PO TABS
5.0000 mg | ORAL_TABLET | Freq: Every evening | ORAL | Status: DC | PRN
  Filled 2015-11-10: qty 1

## 2015-11-10 MED ORDER — MIDAZOLAM HCL 5 MG/5ML IJ SOLN
INTRAMUSCULAR | Status: DC | PRN
  Administered 2015-11-10: 1 mg via INTRAVENOUS

## 2015-11-10 MED ORDER — DOCUSATE SODIUM 283 MG RE ENEM
1.0000 | ENEMA | RECTAL | Status: DC | PRN
  Filled 2015-11-10: qty 1

## 2015-11-10 MED ORDER — DEXTROSE 5 % IV SOLN
1.5000 g | Freq: Once | INTRAVENOUS | Status: AC
  Administered 2015-11-11: 1.5 g via INTRAVENOUS
  Filled 2015-11-10: qty 1.5

## 2015-11-10 MED ORDER — THROMBIN 20000 UNITS EX SOLR
CUTANEOUS | Status: AC
  Filled 2015-11-10: qty 20000

## 2015-11-10 MED ORDER — NEPRO/CARBSTEADY PO LIQD
237.0000 mL | Freq: Three times a day (TID) | ORAL | Status: DC | PRN
  Filled 2015-11-10: qty 237

## 2015-11-10 MED ORDER — MORPHINE SULFATE (PF) 2 MG/ML IV SOLN: 2.0000 mg | INTRAVENOUS | Status: DC | PRN

## 2015-11-10 MED ORDER — PANTOPRAZOLE SODIUM 40 MG PO TBEC
40.0000 mg | DELAYED_RELEASE_TABLET | Freq: Every day | ORAL | Status: DC
  Administered 2015-11-11 – 2015-11-13 (×3): 40 mg via ORAL
  Filled 2015-11-10 (×3): qty 1

## 2015-11-10 MED ORDER — FLEET ENEMA 7-19 GM/118ML RE ENEM: 1.0000 | ENEMA | Freq: Once | RECTAL | Status: DC | PRN

## 2015-11-10 SURGICAL SUPPLY — 61 items
ADH SKN CLS APL DERMABOND .7 (GAUZE/BANDAGES/DRESSINGS) ×1
AGENT HMST SPONGE THK3/8 (HEMOSTASIS)
BANDAGE ESMARK 6X9 LF (GAUZE/BANDAGES/DRESSINGS) IMPLANT
BNDG CMPR 9X6 STRL LF SNTH (GAUZE/BANDAGES/DRESSINGS)
BNDG ESMARK 6X9 LF (GAUZE/BANDAGES/DRESSINGS)
CANISTER SUCTION 2500CC (MISCELLANEOUS) ×3 IMPLANT
CATH EMB 3FR 40CM (CATHETERS) ×2 IMPLANT
CATH EMB 3FR 80CM (CATHETERS) IMPLANT
CATH EMB 4FR 40CM (CATHETERS) ×2 IMPLANT
CATH EMB 4FR 80CM (CATHETERS) IMPLANT
CATH EMB 5FR 80CM (CATHETERS) IMPLANT
CLIP TI MEDIUM 24 (CLIP) ×1 IMPLANT
CLIP TI MEDIUM 6 (CLIP) ×2 IMPLANT
CLIP TI WIDE RED SMALL 24 (CLIP) ×1 IMPLANT
CLIP TI WIDE RED SMALL 6 (CLIP) ×2 IMPLANT
CLOSURE WOUND 1/2 X4 (GAUZE/BANDAGES/DRESSINGS)
CONT SPEC 4OZ CLIKSEAL STRL BL (MISCELLANEOUS) ×2 IMPLANT
CUFF TOURNIQUET SINGLE 18IN (TOURNIQUET CUFF) IMPLANT
CUFF TOURNIQUET SINGLE 24IN (TOURNIQUET CUFF) IMPLANT
CUFF TOURNIQUET SINGLE 34IN LL (TOURNIQUET CUFF) IMPLANT
CUFF TOURNIQUET SINGLE 44IN (TOURNIQUET CUFF) IMPLANT
DERMABOND ADVANCED (GAUZE/BANDAGES/DRESSINGS) ×2
DERMABOND ADVANCED .7 DNX12 (GAUZE/BANDAGES/DRESSINGS) IMPLANT
DRAIN CHANNEL 15F RND FF W/TCR (WOUND CARE) IMPLANT
DRAPE X-RAY CASS 24X20 (DRAPES) IMPLANT
DRSG COVADERM 4X8 (GAUZE/BANDAGES/DRESSINGS) IMPLANT
ELECT REM PT RETURN 9FT ADLT (ELECTROSURGICAL) ×3
ELECTRODE REM PT RTRN 9FT ADLT (ELECTROSURGICAL) ×1 IMPLANT
EVACUATOR SILICONE 100CC (DRAIN) IMPLANT
GLOVE BIO SURGEON STRL SZ7 (GLOVE) ×3 IMPLANT
GLOVE BIOGEL PI IND STRL 6.5 (GLOVE) IMPLANT
GLOVE BIOGEL PI IND STRL 7.5 (GLOVE) ×1 IMPLANT
GLOVE BIOGEL PI INDICATOR 6.5 (GLOVE) ×4
GLOVE BIOGEL PI INDICATOR 7.5 (GLOVE) ×2
GOWN STRL REUS W/ TWL LRG LVL3 (GOWN DISPOSABLE) ×3 IMPLANT
GOWN STRL REUS W/TWL LRG LVL3 (GOWN DISPOSABLE) ×9
HEMOSTAT SPONGE AVITENE ULTRA (HEMOSTASIS) IMPLANT
KIT BASIN OR (CUSTOM PROCEDURE TRAY) ×3 IMPLANT
KIT ROOM TURNOVER OR (KITS) ×3 IMPLANT
NS IRRIG 1000ML POUR BTL (IV SOLUTION) ×4 IMPLANT
PACK CV ACCESS (CUSTOM PROCEDURE TRAY) ×2 IMPLANT
PACK PERIPHERAL VASCULAR (CUSTOM PROCEDURE TRAY) ×1 IMPLANT
PAD ARMBOARD 7.5X6 YLW CONV (MISCELLANEOUS) ×6 IMPLANT
SET COLLECT BLD 21X3/4 12 (NEEDLE) IMPLANT
STAPLER VISISTAT 35W (STAPLE) IMPLANT
STOPCOCK 4 WAY LG BORE MALE ST (IV SETS) IMPLANT
STRIP CLOSURE SKIN 1/2X4 (GAUZE/BANDAGES/DRESSINGS) ×1 IMPLANT
SUT MNCRL AB 4-0 PS2 18 (SUTURE) ×3 IMPLANT
SUT PROLENE 5 0 C 1 24 (SUTURE) ×3 IMPLANT
SUT PROLENE 6 0 BV (SUTURE) ×3 IMPLANT
SUT PROLENE 7 0 BV 1 (SUTURE) ×4 IMPLANT
SUT SILK 2 0 FS (SUTURE) IMPLANT
SUT VIC AB 2-0 CT1 27 (SUTURE)
SUT VIC AB 2-0 CT1 TAPERPNT 27 (SUTURE) ×1 IMPLANT
SUT VIC AB 3-0 SH 27 (SUTURE) ×3
SUT VIC AB 3-0 SH 27X BRD (SUTURE) ×1 IMPLANT
SYR 3ML LL SCALE MARK (SYRINGE) ×3 IMPLANT
TRAY FOLEY W/METER SILVER 16FR (SET/KITS/TRAYS/PACK) ×1 IMPLANT
TUBING EXTENTION W/L.L. (IV SETS) IMPLANT
UNDERPAD 30X30 (UNDERPADS AND DIAPERS) ×3 IMPLANT
WATER STERILE IRR 1000ML POUR (IV SOLUTION) ×3 IMPLANT

## 2015-11-10 NOTE — Anesthesia Preprocedure Evaluation (Addendum)
Anesthesia Evaluation  Patient identified by MRN, date of birth, ID band Patient awake    Reviewed: Allergy & Precautions, H&P , NPO status , Patient's Chart, lab work & pertinent test results  Airway Mallampati: II  TM Distance: >3 FB Neck ROM: Full    Dental no notable dental hx. (+) Edentulous Upper, Edentulous Lower, Dental Advisory Given   Pulmonary neg pulmonary ROS,    Pulmonary exam normal breath sounds clear to auscultation       Cardiovascular hypertension, Pt. on medications and Pt. on home beta blockers  Rhythm:Irregular Rate:Normal + Systolic murmurs    Neuro/Psych negative neurological ROS  negative psych ROS   GI/Hepatic negative GI ROS, Neg liver ROS,   Endo/Other  diabetes, Type 2, Oral Hypoglycemic Agents  Renal/GU Renal disease  negative genitourinary   Musculoskeletal   Abdominal   Peds  Hematology negative hematology ROS (+)   Anesthesia Other Findings   Reproductive/Obstetrics negative OB ROS                            Anesthesia Physical Anesthesia Plan  ASA: III and emergent  Anesthesia Plan: MAC   Post-op Pain Management:    Induction:   Airway Management Planned: Mask  Additional Equipment:   Intra-op Plan:   Post-operative Plan:   Informed Consent: I have reviewed the patients History and Physical, chart, labs and discussed the procedure including the risks, benefits and alternatives for the proposed anesthesia with the patient or authorized representative who has indicated his/her understanding and acceptance.   Dental advisory given  Plan Discussed with: CRNA  Anesthesia Plan Comments:        Anesthesia Quick Evaluation

## 2015-11-10 NOTE — Progress Notes (Addendum)
ANTICOAGULATION CONSULT NOTE - Initial Consult  Pharmacy Consult for heparin  Indication: Afib / DVT  - now s/p embolectomy    Allergies  Allergen Reactions  . No Known Allergies Other (See Comments)   Patient Measurements: Height: 5' (152.4 cm) Weight: 130 lb 15.3 oz (59.4 kg) IBW/kg (Calculated) : 45.5 Heparin Dosing Weight: 59.4 kg   Vital Signs: Temp: 97.8 F (36.6 C) (09/29 2037) Temp Source: Axillary (09/29 2037) BP: 159/29 (09/29 2037) Pulse Rate: 50 (09/29 2037)  Labs:  Recent Labs  11/10/15 1604  HGB 10.4*  HCT 32.2*  PLT 226  APTT 24  LABPROT 15.3*  INR 1.21  CREATININE 4.18*    Estimated Creatinine Clearance: 9.7 mL/min (by C-G formula based on SCr of 4.18 mg/dL (H)).   Medical History: Past Medical History:  Diagnosis Date  . Atrial fibrillation (HCC)    on ASA  . Bladder cancer [facility]) Jan 10, 2006   BLADDER NECK, BIOPSY: DIFFUSE LARGE B-CELL LYMPHOMA. CT showed worrisome pelvic LN. No treatment records in EPIC.  Marland Kitchen Blind 2005  . Constipation   . Diabetes mellitus   . ESRD on dialysis [facility])    - Dialysis- [place]  . History of blood transfusion   . Hyperparathyroidism, secondary ([place])   . Hypertension   . Non-Hodgkin lymphoma ([place]) 2007  . Pulmonary HTN ([place])   . 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 Now s/p thrombectomy and pharmacy to dose heparin starting at MIDNIGHT with NO bolus. Patient with ESRD on HD TTS and has history of Afib. Per notes, not on oral anticoagulation PTA due to HD and history cirrhosis.  H&H low 10.4/32.3, plt normal, INR 1.21. No overt signs or symptoms of bleeding noted.   Goal of Therapy:  Heparin level 0.3-0.7 units/ml Monitor platelets by anticoagulation protocol: Yes   Plan:  No bolus  Start heparin infusion at 950 units/hr at Midnight  Check anti-Xa level in 8 hours and daily while on  heparin Continue to monitor H&H and platelets  Argie Ramming, PharmD Pharmacy Resident  Pager [phone] 11/10/15 8:55 PM

## 2015-11-10 NOTE — Transfer of Care (Signed)
Immediate Anesthesia Transfer of Care Note  Patient: [name]  Procedure(s) Performed: Procedure(s): RIGHT THROMBECTOMY BRACHIAL ARTERY (Right)  Patient Location: PACU  Anesthesia Type:MAC  Level of Consciousness: awake  Airway & Oxygen Therapy: Patient Spontanous Breathing and Patient connected to nasal cannula oxygen  Post-op Assessment: Report given to RN and Post -op Vital signs reviewed and stable  Post vital signs: Reviewed and stable  Last Vitals:  Vitals:   11/10/15 1623 11/10/15 1903  BP:  (!) 105/41  Pulse: (!) 56 (!) 50  Resp: 15 16  Temp:  36.4 C    Last Pain:  Vitals:   11/10/15 1903  TempSrc:   PainSc: 0-No pain         Complications: No apparent anesthesia complications

## 2015-11-10 NOTE — ED Triage Notes (Signed)
Patient states she is having pain and numbness which started 6 am Denies any injury  10/10 pain

## 2015-11-10 NOTE — ED Provider Notes (Signed)
I saw and evaluated the patient, reviewed the resident's note and I agree with the findings and plan.  75 yo female female with a history of atrial fibrillation, diabetes, hypertension, lymphoma and pulmonary hypertension here with right arm pain started around 6:00 this morning with numbness and pain of a moderate level. With urgent care and was sent here for further evaluation secondary to not able to palpate a pulse. My examination her right distal one third of her forearm and hand is slightly colder than corresponding area on the left side. Not able to palpate a pulse so ultrasound done as documented by the resident without any obvious radial artery flow in the distal third of that arm. Plan for starting heparin after making sure no contraindications and consult vascular surgery.  CRITICAL CARE Performed by: Merrily Pew Total critical care time: 35 minutes Critical care time was exclusive of separately billable procedures and treating other patients. Critical care was necessary to treat or prevent imminent or life-threatening deterioration. Critical care was time spent personally by me on the following activities: development of treatment plan with patient and/or surrogate as well as nursing, discussions with consultants, evaluation of patient's response to treatment, examination of patient, obtaining history from patient or surrogate, ordering and performing treatments and interventions, ordering and review of laboratory studies, ordering and review of radiographic studies, pulse oximetry and re-evaluation of patient's condition.  Final Diagnosis: Radial artery occlusion, right [facility])    Merrily Pew, MD 11/13/15 1036

## 2015-11-10 NOTE — Consult Note (Signed)
Medical Consultation   [name]  [id number]  DOB: 01-Feb-1941  DOA: 11/10/2015  PCP: Kathlene November, MD   Outpatient Specialists: [facility]; nephrology for ESRD/HD Tu/Th/Sat)   Requesting physician: Bridgett Larsson  Reason for consultation: Medical management - assume care   History of Present Illness: [name] is an 75 y.o. female with ESRD.  Clot s/p thrombectomy.  Presented to ER today with pain in arm that started this AM.  Pain was in right arm and was constant in her palm area.  Pain felt like cramps, numb, 5/10 pain.  Nothing made the pain better or worse.  Was seen at urgent care and they were unable to appreciate a radial pulse and so transferred the patient to the ER.  [place] confirmed no obvious radial artery flow and the patient was taken for R radial artery thrombectomy by Dr. Bridgett Larsson.  Now, s/p thrombectomy, the patient has still has some pain in her hand, but better now than prior.  Known h/o afib, does not take anticoagulants other than ASA. CHA2DS2-VASc score is 5, indicating a 6.7%/year adjusted stroke rate.   Review of Systems:  ROS As per HPI otherwise 10 point review of systems reviewed and negative.    Past Medical History: Past Medical History:  Diagnosis Date  . Atrial fibrillation (HCC)    on ASA  . Bladder cancer [facility] Jan 10, 2006   BLADDER NECK, BIOPSY: DIFFUSE LARGE B-CELL LYMPHOMA. CT showed worrisome pelvic LN. No treatment records in EPIC.  Marland Kitchen Blind 2005  . Chronic liver disease    non-alcoholic cirrhosis  . Constipation   . Diabetes mellitus   . ESRD on dialysis [facility])    - Dialysis- [place]  . History of blood transfusion   . Hyperparathyroidism, secondary ([place])   . Hypertension   . Non-Hodgkin lymphoma ([place]) 2007  . Pulmonary HTN ([place])   . Radiation    for bladder cancer    Past Surgical History: Past Surgical History:  Procedure Laterality Date  . AV FISTULA PLACEMENT Right   . AV FISTULA  PLACEMENT Left   . COLONOSCOPY    . FINGER SURGERY Left    Skin graft - skin graft  . INSERTION OF DIALYSIS CATHETER    . 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 No Known Allergies Other (See Comments)     Social History:  reports that she has never smoked. She has never used smokeless tobacco. She reports that she does not drink alcohol or use drugs.   Family History: Family History  Problem Relation Age of Onset  . Thyroid disease Mother   . Lung cancer Father   . Colon cancer Neg Hx   . Breast cancer Neg Hx      Physical Exam: Vitals:   11/10/15 1951 11/10/15 2000 11/10/15 2001 11/10/15 2037  BP: (!) 110/30   (!) 159/29  Pulse: (!) 49 (!) 52 (!) 47 (!) 50  Resp: 16 16 19 20   Temp:  98 F (36.7 C)  97.8 F (36.6 C)  TempSrc:    Axillary  SpO2: 94% 100% 100% 100%  Weight:    59.4 kg (130 lb  15.3 oz)  Height:    5' (1.524 m)    Constitutional: Alert and awake, oriented x3, not in any acute distress.  Appears older than stated age. Eyes: EOMI, irises appear normal, anicteric sclera,  ENMT: external ears and nose appear normal, Lips appears normal,  Neck: neck appears normal, no masses, normal ROM, no thyromegaly, no JVD.  Loud left carotid bruit but this appears to be sound migrating up from her very loud heart murmur. CVS: S1-S2 clear, 4/6 systolic murmur, no rubs or gallops, no LE edema, normal pedal pulses  Respiratory:  clear to auscultation bilaterally, no wheezing, rales or rhonchi. Respiratory effort normal. No accessory muscle use.  Abdomen: soft nontender, nondistended, normal bowel sounds, no hepatosplenomegaly, no hernias  Musculoskeletal: : no cyanosis, clubbing or edema noted bilaterally; R arm bandage c/d/i Neuro: Cranial  nerves II-XII intact, strength, sensation, reflexes Psych: judgement and insight appear normal, stable mood and affect, mental status Skin: no rashes or lesions or ulcers, no induration or nodules    Data reviewed:  I have personally reviewed following labs and imaging studies Labs:  CBC:  Recent Labs Lab 11/10/15 1604  WBC 4.9  NEUTROABS 2.4  HGB 10.4*  HCT 32.2*  MCV 104.5*  PLT [id number]    Basic Metabolic Panel:  Recent Labs Lab 11/10/15 1604  NA 135  K 3.2*  CL 97*  CO2 28  GLUCOSE 173*  BUN 21*  CREATININE 4.18*  CALCIUM 10.7*   GFR Estimated Creatinine Clearance: 9.7 mL/min (by C-G formula based on SCr of 4.18 mg/dL (H)). Liver Function Tests:  Recent Labs Lab 11/10/15 1604  AST 25  ALT 13*  ALKPHOS 81  BILITOT 0.6  PROT 6.9  ALBUMIN 3.1*   No results for input(s): LIPASE, AMYLASE in the last 168 hours. No results for input(s): AMMONIA in the last 168 hours. Coagulation profile  Recent Labs Lab 11/10/15 1604  INR 1.21    Cardiac Enzymes: No results for input(s): CKTOTAL, CKMB, CKMBINDEX, TROPONINI in the last 168 hours. BNP: Invalid input(s): POCBNP CBG:  Recent Labs Lab 11/10/15 1658 11/10/15 1923  GLUCAP 174* 164*   D-Dimer No results for input(s): DDIMER in the last 72 hours. Hgb A1c No results for input(s): HGBA1C in the last 72 hours. Lipid Profile No results for input(s): CHOL, HDL, LDLCALC, TRIG, CHOLHDL, LDLDIRECT in the last 72 hours. Thyroid function studies No results for input(s): TSH, T4TOTAL, T3FREE, THYROIDAB in the last 72 hours.  Invalid input(s): FREET3 Anemia work up No results for input(s): VITAMINB12, FOLATE, FERRITIN, TIBC, IRON, RETICCTPCT in the last 72 hours. Urinalysis No results found for: COLORURINE, APPEARANCEUR, LABSPEC, [place], GLUCOSEU, HGBUR, BILIRUBINUR, KETONESUR, PROTEINUR, UROBILINOGEN, NITRITE, [place]   Microbiology No results found for this or any previous visit (from the past 240  hour(s)).     Inpatient Medications:   Scheduled Meds: . [START ON 11/11/2015] cefUROXime (ZINACEF)  IV  1.5 g Intravenous Once  . [START ON 11/11/2015] cinacalcet  60 mg Oral BID WC  . [START ON 11/11/2015] insulin aspart  0-15 Units Subcutaneous TID WC  . labetalol  200 mg Oral BID  . pantoprazole  40 mg Oral Daily   Continuous Infusions: . sodium chloride    . [START ON 11/11/2015] heparin       Radiological Exams on Admission: No results found.  Impression/Recommendations Principal Problem:   Thromboembolism ([place]) Active Problems:   ESRD (end stage renal disease) on dialysis ([place])   Diabetes mellitus type II, non insulin  dependent ([place])   Hypertension   Non-Hodgkin's lymphoma of extranodal site ([place])   Cirrhosis, non-alcoholic (HCC)   Atrial fibrillation (HCC)   Cardiac murmur  Thromboembolism s/p thrombectomy of right radial artery -Patient with improved pain/numbness s/p thrombectomy -Will need anticoagulation, likely lifelong -Heparin drip for now -Will need transition to Coumadin, as NOAC is not a reasonable option in this situation  ESRD -Nephrology consult line notified of admission, as patient has Tu/Th/Sa HD and will need maintenance dialysis tomorrow -Nephrology prn order set utilized  Afib -Bradycardia, so rate controlled - this is likely related to Labetolol -Not previously on anticoagulation, possibly not even ASA (not listed on her medication list) -CHA2DS2-VASc score is 5, indicating a 6.7%/year adjusted stroke rate.  HTN -BP erratic - as low as 99/32 and as high as 159/29 -Will follow  DM -On Glucotrol -Holding PO med, cover with SSI -No recent A1c, will check (already ordered by Dr. Bridgett Larsson)  Cirrhosis -NASH, no evidence of lymphoma in ascites fluid in 2/15 -Stable LFTs at this time -Unlikely to impact current management  NHL -Patient followed by Dr. Jana Hakim, last seen 04/10/15 -Diffuse large B-cell subtype, diagnosed through bladder neck  biopsy in Nov 2007, s/p bladder radiation; PET scan in 3/15 showed no active disease  Murmur -Extremely loud murmur which radiates to left carotid -Last echo in 4/15 showed moderate to severe tricuspid regurgitation, so this may be the cause (EF 60-65%, unable to evaluate diastolic function)  Other  -SW consult - wants to change PCP -Suggest palliative care consult - this is a patient with multiple serious underlying medical conditions who would benefit from early involvement by palliative care.  I did not discuss this with the family at the time of my evaluation and so will defer this issue to the day team.  Thank you for this consultation.  Our [facility] hospitalist team will assume care of the patient in the morning.  Time Spent: 63 min  Karmen Bongo M.D. [facility] 11/10/2015, 9:38 PM

## 2015-11-10 NOTE — ED Provider Notes (Signed)
[place]    CSN: [id number] Arrival date & time: 11/10/15  1155     History   Chief Complaint Chief Complaint  Patient presents with  . Arm Pain    HPI ZAIA VALA is a 75 y.o. female. Pt has hx ESRD on hemodialysis Tues-Thurs-Sat, and cirrhosis (requiring paracentesis x 2, last 01/2015). Onset of R arm pain at 6am today, starts in shoulder and radiates down arm to hand.  Hand is cold and feels numb.  No trauma, no unusual activities.  No chest discomfort, no dyspnea, no cough. No fever. Some right upper quadrant discomfort.  Felt fine yesterday.  Being dialyzed through AV access L upper arm.    HPI  Past Medical History:  Diagnosis Date  . Atrial fibrillation (HCC)    on ASA  . Bladder cancer [facility]) Jan 10, 2006   BLADDER NECK, BIOPSY: DIFFUSE LARGE B-CELL LYMPHOMA. CT showed worrisome pelvic LN. No treatment records in EPIC.  Marland Kitchen Blind 2005  . Constipation   . Diabetes mellitus   . ESRD on dialysis [facility])    - Dialysis- [place]  . History of blood transfusion   . Hyperparathyroidism, secondary ([place])   . Hypertension   . Non-Hodgkin lymphoma ([place]) 2007  . Pulmonary HTN ([place])   . Radiation    for bladder cancer    Patient Active Problem List   Diagnosis Date Noted  . Constipation 01/27/2014  . Atrial fibrillation ([place]) 11/22/2013  . Cirrhosis, non-alcoholic ([place]) [id number]  . Left inguinal hernia 03/24/2013  . Axillary lymphadenopathy 03/12/2013  . Abnormal EKG 04/08/2012  . Hypercalcemia associated with chronic dialysis 04/08/2012  . Non-Hodgkin's lymphoma of extranodal site ([place]) 10/16/2011  . ESRD (end stage renal disease) on dialysis ([place]) 05/05/2011  . Dizziness 05/05/2011  . Diabetes mellitus type II, non insulin dependent ([place]) 05/05/2011  . Hypertension 05/05/2011    Past Surgical History:  Procedure Laterality Date  . AV FISTULA PLACEMENT Right   . AV FISTULA PLACEMENT Left   . COLONOSCOPY    . FINGER  SURGERY Left    Skin graft - skin graft  . INSERTION OF DIALYSIS CATHETER    . 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 Authorizing Provider  acetaminophen (TYLENOL) 325 MG tablet Take 650 mg by mouth every 6 (six) hours as needed for moderate pain, fever or headache.     Historical Provider, MD  diphenhydrAMINE-zinc acetate (BENADRYL) cream Apply topically 3 (three) times daily as needed for itching. Patient not taking: Reported on 10/09/2015 04/10/15   Chauncey Cruel, MD  diphenhydrAMINE-zinc acetate (BENADRYL) cream Apply 1 application topically 3 (three) times daily as needed for itching.    Historical Provider, MD  glipiZIDE (GLUCOTROL) 5 MG tablet Take 5 mg by mouth 2 (two) times daily. 09/18/15   Historical Provider, MD  labetalol (NORMODYNE) 200 MG tablet Take 200 mg by mouth 2 (two) times daily.    Historical Provider, MD  omeprazole (PRILOSEC) 20 MG capsule Take 20 mg by mouth daily.  04/08/13   Historical Provider, MD  oxyCODONE-acetaminophen (ROXICET) 5-325 MG tablet Take 1-2 tablets by mouth every 6 (six) hours as needed for severe pain. 09/20/15  Kimberly A Trinh, PA-C  SENSIPAR 60 MG tablet Take 60 mg by mouth 2 (two) times daily.  04/19/13   Historical Provider, MD  sorbitol 70 % solution take 30 milliliters by mouth once daily if needed for constipation 01/12/14   Midge Minium, MD    Family History Family History  Problem Relation Age of Onset  . Thyroid disease Mother   . Lung cancer Father   . Colon cancer Neg Hx   . Breast cancer Neg Hx     Social History Social History  Substance Use Topics  . Smoking status: Never Smoker  . Smokeless tobacco: Never Used  . Alcohol use No      Allergies   No known allergies   Review of Systems Review of Systems  All other systems reviewed and are negative.    Physical Exam Triage Vital Signs ED Triage Vitals [11/10/15 1209]  Enc Vitals Group     BP 104/66     Pulse Rate (!) 57     Resp 16     Temp 98.6 F (37 C)     Temp Source Oral     SpO2 98 %     Weight      Height    Updated Vital Signs BP 104/66 (BP Location: Right Wrist)   Pulse (!) 57 Comment: notified rn  Temp 98.6 F (37 C) (Oral)   Resp 16   SpO2 98%  Physical Exam  Constitutional: She is oriented to person, place, and time. No distress.  Alert, nicely groomed  HENT:  Head: Atraumatic.  Eyes:  Conjugate gaze, no eye redness/drainage  Neck: Neck supple.  Cardiovascular: Normal rate.   Pulmonary/Chest: No respiratory distress.  Lungs clear, symmetric breath sounds  Abdominal: She exhibits distension.  Musculoskeletal: Normal range of motion.  No leg swelling Able to raise R arm at shoulder.  Marked temperature difference between R upper arm (warm) and R forearm/hand (cold). No erythema/swelling/focal tenderness/bruising.  Not able to palpate radial/ulnar pulses at R wrist.    Neurological: She is alert and oriented to person, place, and time.  Skin: Skin is warm and dry.  No cyanosis  Nursing note and vitals reviewed.    UC Treatments / Results   Procedures Procedures (including critical care time)      Unable to doppler a pulse in the radial/ulnar aspects of the right wrist   Final Clinical Impressions(s) / UC Diagnoses   Final diagnoses:  Right hand pain  Ischemia of hand   Discharging to seek further evaluation of apparent ischemic right hand in [facility] ED.  Spoke with charge nurse Nicki.    Sherlene Shams, MD 11/10/15 1335

## 2015-11-10 NOTE — Op Note (Signed)
OPERATIVE NOTE   PROCEDURE: 1. Right radial artery thrombectomy  PRE-OPERATIVE DIAGNOSIS: thromboembolism right distal radial artery  POST-OPERATIVE DIAGNOSIS: same as above   SURGEON: Adele Barthel, MD  ASSISTANT(S): Leontine Locket, PAC   ANESTHESIA: local and MAC  ESTIMATED BLOOD LOSS: 100 cc  FINDING(S): 1.  Likely embolism to distal radial artery 2.  No proximal thrombus 3.  Diseased but not calcified radial artery 4.  Palpable radial pulse at the end of case 5.  Doppler ulnar artery at end of case  SPECIMEN(S):  Distal radial artery embolus  INDICATIONS:   [name] is a 75 y.o. female with ESLD, ESRD-HD, and afib who presents with acute pain in right hand.  On exam, there was no radial signal consistent with likely distal radial artery thromboembolism.  I recommended going to the operating room emergently for a right arm thromboectomy.  The risk, benefits, and alternative for bypass operations were discussed with the patient.  The patient is aware the risks include but are not limited to: bleeding, infection, myocardial infarction, stroke, limb loss, nerve damage, need for additional procedures in the future, wound complications, and inability to complete the procedure.  The patient is aware of these risks and agreed to proceed.  DESCRIPTION: After obtaining full informed written consent, the patient was brought back to the operating room and placed supine upon the operating table.  The patient received IV antibiotics prior to induction.  After obtaining adequate anesthesia, the patient was prepped and draped in the standard fashion for: right arm exploration.  Using a Sonosite, I identified a segment in the proximal forearm that demonstrated a transition from pulsatile lumen to a non-pulsatile lumen.  I injected a total of 10 cc of 1:1 mixture of 0.5% marcaine with 1% lidocaine with epinephrine in the skin overlying this segment.  I made an incision here and dissected  down to the artery.  I dissected out a 5 cm segment of artery.  The artery externally appeared to have atherosclerotic disease.  I gave 8000 units of Heparin intravenously.  After waiting 3 minutes, I clamped the artery proximally and made an arteriotomy transversely.  There was minimal backbleeding.  I opened the arteriotomy with a small Potts scissor to facilitate the thrombectomy.  I passed a 3 [place] distally to 20 cm.  This retrieved acute embolus from the distal radial artery on two passes.  This resulted in return of bright red blood from the distal radial artery lumen.  I made another 2 passes without any further thrombus.  I washed out the lumen with heparinized saline.  I clamped the radial artery distally.  I passed the 3 Fogarty proximally twice and never got any thrombus back, though I did get some improved bleeding.  I clamped the radial artery proximally.  I washed out the arteriotomy with heparinized saline.  I repaired the arteriotomy with two running stitches of 7-0 Prolene sewed from each end of the arteriotomy and tied together.  There was no further bleeding from the artery at this point.  I could easily palpate a radial pulse at this point.  As this patient had backbleeding from the distal radial artery due to intact ulnar blood to palmar arch, I did not feel a forearm fasciotomy would need to be completed.  The subcutaneous tissue was reapproximated with a running stitch of 3-0 Vicryl.  The skin was reapproximated with a running stitch of 4-0 Vicryl.  The skin was cleaned, dried, and reinforced with  Dermabond.   COMPLICATIONS: none  CONDITION: stable  Adele Barthel, MD [facility] Office: [phone] Pager: [phone]  11/10/2015, 6:35 PM

## 2015-11-10 NOTE — Progress Notes (Signed)
   Daily Progress Note  Given likely embolism to R radial artery resulting in R hand ischemia, pt should be considered for lifelong anticoagulation.  Her liver does not appeared to be actively decompensated at this point, so anticoagulation is reasonable to avoid thromboembolic complications from afib.     - I have discussed with [facility] assuming care of this patient with DM, cirrhosis, afib, and ESRD-HD, who now needs to have anticoagulation loaded. - She will NOT be a candidate for NOAC with both liver and renal disease. - Given her chronic medical problems, she would be better managed on a primarily medical service.  Adele Barthel, MD, FACS [facility] [place] Office: [phone] Pager: [phone]  11/10/2015, 7:00 PM

## 2015-11-10 NOTE — ED Provider Notes (Signed)
[place] DEPT Provider Note  CSN: [id number] Arrival Date & Time: 11/10/15 @ 1338  History    Chief Complaint Chief Complaint  Patient presents with  . Arm Pain    HPI [name] is a 75 y.o. female. Patient presents for assessment of right arm pain. Started at 6 AM. Sensation is numbness and pain. Severity 5/10.  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 Has not required paracentesis since December of 2016. Dialysis at   Patient denies CP, SOB, or abdominal pain. Denies fever or chills. Denies back pain.  Patient has never had AV fistula attempted on the right side.  Has history of Afib but does not take AC. No history of blood clots. No recurrent blood clots in family members.  Past Medical & Surgical History    Past Medical History:  Diagnosis Date  . Atrial fibrillation (HCC)    on ASA  . Bladder cancer [facility]) Jan 10, 2006   BLADDER NECK, BIOPSY: DIFFUSE LARGE B-CELL LYMPHOMA. CT showed worrisome pelvic LN. No treatment records in EPIC.  Marland Kitchen Blind 2005  . Constipation   . Diabetes mellitus   . ESRD on dialysis [facility])    - Dialysis- [place]  . History of blood transfusion   . Hyperparathyroidism, secondary ([place])   . Hypertension   . Non-Hodgkin lymphoma ([place]) 2007  . Pulmonary HTN ([place])   . Radiation    for bladder cancer   Patient Active Problem List   Diagnosis Date Noted  . Constipation 01/27/2014  . Atrial fibrillation ([place]) 11/22/2013  . Cirrhosis, non-alcoholic ([place]) 123456  . Left inguinal hernia 03/24/2013  . Axillary lymphadenopathy 03/12/2013  . Abnormal EKG 04/08/2012  . Hypercalcemia associated with chronic dialysis 04/08/2012  . Non-Hodgkin's lymphoma of extranodal site ([place]) 10/16/2011  . ESRD (end stage renal disease) on dialysis ([place]) 05/05/2011  . Dizziness 05/05/2011  . Diabetes mellitus type II, non insulin dependent ([place]) 05/05/2011  . Hypertension 05/05/2011     Past Surgical History:  Procedure Laterality Date  . AV FISTULA PLACEMENT Right   . AV FISTULA PLACEMENT Left   . COLONOSCOPY    . FINGER SURGERY Left    Skin graft - skin graft  . INSERTION OF DIALYSIS CATHETER    . 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 Thyroid disease Mother   . Lung cancer Father   . Colon cancer Neg Hx   . Breast cancer Neg Hx    Social History  Substance Use Topics  . Smoking status: Never Smoker  . Smokeless tobacco: Never Used  . Alcohol use No    Home Medications    Prior to Admission medications   Medication Sig Start Date End Date Taking? Authorizing Provider  acetaminophen (TYLENOL) 325 MG tablet Take 650 mg by mouth every 6 (six) hours as needed for moderate pain, fever or headache.     Historical Provider, MD  diphenhydrAMINE-zinc acetate (BENADRYL) cream Apply topically 3 (three) times daily as needed for itching. Patient not taking: Reported on 10/09/2015 04/10/15   Chauncey Cruel, MD  diphenhydrAMINE-zinc acetate (BENADRYL) cream Apply 1 application topically 3 (three) times daily as needed for itching.  Historical Provider, MD  glipiZIDE (GLUCOTROL) 5 MG tablet Take 5 mg by mouth 2 (two) times daily. 09/18/15   Historical Provider, MD  labetalol (NORMODYNE) 200 MG tablet Take 200 mg by mouth 2 (two) times daily.    Historical Provider, MD  omeprazole (PRILOSEC) 20 MG capsule Take 20 mg by mouth daily.  04/08/13   Historical Provider, MD  oxyCODONE-acetaminophen (ROXICET) 5-325 MG tablet Take 1-2 tablets by mouth every 6 (six) hours as needed for severe pain. 09/20/15   Kimberly A Trinh, PA-C  SENSIPAR 60 MG tablet Take 60 mg by mouth 2 (two) times daily.  04/19/13   Historical Provider, MD   sorbitol 70 % solution take 30 milliliters by mouth once daily if needed for constipation 01/12/14   Midge Minium, MD    Allergies    No known allergies  I reviewed & agree with nursing's documentation on the patient's past medical, surgical, social & family histories as well as their allergies.  Review of Systems  Complete ROS obtained, and is negative except as stated in HPI.   Physical Exam  Updated Vital Signs BP (!) 151/41 (BP Location: Right Arm)   Pulse (!) 48   Temp 97.8 F (36.6 C) (Oral)   Resp 18   SpO2 98%  I have reviewed the triage vital signs and the nursing notes. Physical Exam CONST: Patient alert, well appearing, in no apparent distress.  EYES: PERRLA. EOMI. Conjunctiva w/o d/c. Lids AT w/o swelling.  ENMT: External Nares & Ears AT w/o swelling. Oropharynx patent. MM moist.  NECK: ROM full w/o rigidity. Trachea midline. JVD absent.  CVS: +S1/S2 w/ obvious systolic ejection murmur. Lower extremities w/o pitting edema.  RESP: Respiratory effort unlabored w/o retractions or accessory muscle use. BS clear bilaterally.  GI: Soft & ND. +BS x 4. TTP absent. Hernia absent. Guarding & Rebound absent.  BACK: CVA TTP absent bilaterally.  SKIN: Skin dry. Turgor poor. No rash. Cool skin per distal RUE. LUE AV fistula with palpable thrill and audible bruit.  PSYCH: Alert. Oriented. Affect and mood appropriate.  NEURO: CN II-XII grossly intact. Motor exam asymmetric w/ upper & lower extremity per left 5/5 and 4+/5 per RUE and 5/5 RLE. Sensation decreased to two point discrimination per RUE.  MSK: Joints located & stable, w/o obvious dislocation & obvious deformity or crepitus absent w/ Cap refill > 2 sec per RUE, other extremities <2 seconds. Peripheral pulses not palpable for radial or ulnar arteries in RUE and LUE distal to mid-forearm. RLE & LLE 2+ & equal in all extremities.   ED Treatments & Results   Labs (only abnormal results are displayed) Labs Reviewed   CBC WITH DIFFERENTIAL/PLATELET  COMPREHENSIVE METABOLIC PANEL  APTT  PROTIME-INR  I-STAT CG4 LACTIC ACID, ED  TYPE AND SCREEN    EKG    EKG Interpretation  Date/Time:    Ventricular Rate:    PR Interval:    QRS Duration:   QT Interval:    QTC Calculation:   R Axis:     Text Interpretation:         Radiology No results found.  Pertinent labs & imaging results that were available during my care of the patient were personally visualized by me and considered in my medical decision making, please see chart for details.  Procedures (including critical care time) Procedures  Medications Ordered in ED Medications - No data to display  Initial Impression & Assessment / Evaluation & Plan / ED Course &  Final Dispo   Initial Impression & Assessment Patient presents to the emergency car for evaluation of acute arm pain of 6 AM this morning. Patient was seen at outside urgent care or transfer here for concerns of arterial clot off in right upper extremity. Patient appears well however is in distress from pain in right upper extremity. Per exam of right upper extremity in comparison to left, the patient's capillary refill and 2. discrimination are decreased.  Patient is hemodynamically stable however is bradycardic but is alert and oriented 4.  Evaluation & Plan For examination concern at this time involves arterial clot in RUE. No evidence of infection per this extremity and patient has no chest pain or shortness of breath or abdominal pain or back pain therefore doubt aortic dissection which I believe also unlikely given the patient has proximal arterial brachial Doppler signals and pulses that are palpable in RUE.  Given my concern for acute arterial injury versus clot I obtained an emergent consultation from vascular surgery who came to the patient's bedside and after discussion of the patient's ED clinical course and decision was made to start Heparin.  I obtained coagulation  profile EKG screening laboratory work and performed digital rectal exam which revealed no gross blood however scant occult blood present. Given the risks of gastrointestinal bleed versus loss of upper extremity, per discussion with the patient and family decision was made to start heparin per pharmacy protocol.  Final Dispo Reassessment of the patient reveals Vascular surgery agrees with my concern for acute arterial injury therefore taken to OR urgently. Transferred to OR HD stable.  Final Clinical Impressions(s) & ED Diagnoses  No diagnosis found. Patient care discussed with the attending physician, who oversaw their evaluation & treatment & voiced agreement. Note: This document was prepared using Dragon voice recognition software and may include unintentional dictation errors.  House Officer: Voncille Lo, MD, Emergency Medicine Resident.   Voncille Lo, MD 11/12/15 [id number]    Merrily Pew, MD 11/13/15 1037

## 2015-11-10 NOTE — ED Triage Notes (Signed)
Patient states arm and shoulder pain that started this morning at 0600.   Patient states her arm feels cold.   Hard time finding pulse on the arm.   Arm is warm to the touch, hand is cooler.    Denies other symptomsl.

## 2015-11-10 NOTE — Consult Note (Addendum)
Referred by:  Dr. Dayna Barker [facility] ED)  Reason for referral: right arm pain    History of Present Illness  [name] is a 75 y.o. ([birth year]) female who presents with cc: right arm pain.  History is obtained from chart and translator.  Pt reported woke R arm pain described as achy, radiating from shoulder to hand.  Now localizing to hand.  Some numbness is associated with the pain.  She was seen in an urgent care center and sent to the ED for further evaluation.  Pt has known history of Afib.  Not on anticoagulation due to ESRD-HD and cirrhosis.    Past Medical History:  Diagnosis Date  . Atrial fibrillation (HCC)    on ASA  . Bladder cancer [facility]) Jan 10, 2006   BLADDER NECK, BIOPSY: DIFFUSE LARGE B-CELL LYMPHOMA. CT showed worrisome pelvic LN. No treatment records in EPIC.  Marland Kitchen Blind 2005  . Constipation   . Diabetes mellitus   . ESRD on dialysis [facility])    - Dialysis- [place]  . History of blood transfusion   . Hyperparathyroidism, secondary ([place])   . Hypertension   . Non-Hodgkin lymphoma ([place]) 2007  . Pulmonary HTN ([place])   . Radiation    for bladder cancer    Past Surgical History:  Procedure Laterality Date  . AV FISTULA PLACEMENT Right   . AV FISTULA PLACEMENT Left   . COLONOSCOPY    . FINGER SURGERY Left    Skin graft - skin graft  . INSERTION OF DIALYSIS CATHETER    . 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 Marital status: Married    Spouse name: N/A  . Number of children: 8  . Years of education: N/A   Occupational History  . Disabled Unemployed   Social History Main Topics  . Smoking status: Never Smoker  . Smokeless tobacco: Never Used  . Alcohol use No  . Drug use: No  . Sexual activity: Not on file    Other Topics Concern  . Not on file   Social History Narrative   Original from [place]    In [place] since ~ 2006   household -- husband       Communicate with daughter-in-law Murray Hodgkins [phone]    Family History  Problem Relation Age of Onset  . Thyroid disease Mother   . Lung cancer Father   . Colon cancer Neg Hx   . Breast cancer Neg Hx     No current facility-administered medications for this encounter.    Current Outpatient Prescriptions  Medication Sig Dispense Refill  . acetaminophen (TYLENOL) 325 MG tablet Take 650 mg by mouth every 6 (six) hours as needed for moderate pain, fever or headache.     . diphenhydrAMINE-zinc acetate (BENADRYL) cream Apply topically 3 (three) times daily as needed for itching. (Patient not taking: Reported on 10/09/2015) 28.3 g 0  . diphenhydrAMINE-zinc acetate (BENADRYL) cream Apply 1 application topically 3 (three) times daily as needed for itching.    Marland Kitchen glipiZIDE (GLUCOTROL) 5 MG tablet Take 5 mg by mouth 2 (two) times daily.  0  . labetalol (NORMODYNE) 200 MG tablet Take 200 mg by mouth 2 (two) times daily.    Marland Kitchen omeprazole (PRILOSEC) 20 MG capsule  Take 20 mg by mouth daily.     Marland Kitchen oxyCODONE-acetaminophen (ROXICET) 5-325 MG tablet Take 1-2 tablets by mouth every 6 (six) hours as needed for severe pain. 20 tablet 0  . SENSIPAR 60 MG tablet Take 60 mg by mouth 2 (two) times daily.     . sorbitol 70 % solution take 30 milliliters by mouth once daily if needed for constipation 300 mL 0    Allergies  Allergen Reactions  . No Known Allergies Other (See Comments)     REVIEW OF SYSTEMS:   Cardiac:  positive for: no symptoms, negative for: Chest pain or chest pressure, Shortness of breath upon exertion and Shortness of breath when lying flat,   Vascular:  positive for: Pain in R hand,  negative for: Pain in calf, thigh, or hip brought on by ambulation, Pain in feet at night that wakes you up from your sleep, Blood clot in your veins and Leg  swelling  Pulmonary:  positive for: no symptoms,  negative for: Oxygen at home, Productive cough and Wheezing  Neurologic:  positive for: Problems with dizziness, negative for: Sudden weakness in arms or legs, Sudden numbness in arms or legs, Sudden onset of difficulty speaking or slurred speech, Temporary loss of vision in one eye and Problems with dizziness  Gastrointestinal:  positive for: no symptoms, negative for: Blood in stool and Vomited blood  Genitourinary:  positive for: no symptoms, negative for: Burning when urinating and Blood in urine  Psychiatric:  positive for: no symptoms,  negative for: Major depression  Hematologic:  positive for: no symptoms,  negative for: negative for: Bleeding problems and Problems with blood clotting too easily  Dermatologic:  positive for: no symptoms, negative for: Rashes or ulcers  Constitutional:  positive for: no symptoms, negative for: Fever or chills   Musculoskeletal: Positive for: no symptoms Negative for: chronic back pain or joint pain  Ear/Nose/Throat:  Positive for: no symptoms, Negative for:  Change in eyesight, Change in hearing, Nose bleeds, Sore throat    Physical Examination  Vitals:   11/10/15 1345 11/10/15 1539  BP: (!) 148/41 (!) 151/41  Pulse: (!) 50 (!) 48  Resp: 16 18  Temp: 98.3 F (36.8 C) 97.8 F (36.6 C)  TempSrc: Oral Oral  SpO2: 98% 98%    There is no height or weight on file to calculate BMI.  General: Alert, O x 3, Cachectic,Ill appearing  Head: [place]/AT,   Ear/Nose/Throat: Hearing grossly intact, nares without erythema or drainage, oropharynx without Erythema or Exudate , Mallampati score: 3, teeth missing  Eyes: PERRLA, EOMI,   Neck: Supple, mid-line trachea,    Pulmonary: Sym exp, good B air movt,CTA B  Cardiac: Irregularly, irregular rate and rhythm, no Murmurs, No rubs, No S3,S4  Vascular: Vessel Right Left  Radial Not palpable Not palpable  Brachial Palpable  Palpable  Carotid Palpable, No Bruit Palpable, No Bruit  Aorta Not palpable N/A  Femoral Palpable Palpable  Popliteal Not palpable Not palpable  PT Not palpable Not palpable  DP Not palpable Not palpable   Gastrointestinal: soft, distended, appropriate tenderness to palpation, No guarding or rebound, HSM present, no masses, no CVAT B, No palpable prominent aortic pulse due to pannus, palpable mid-line defect, +ballotable fluid  Musculoskeletal: M/S 5/5 throughout  , Extremities without ischemic changes  , No edema present,  , LDS present: B leg, palpable L BC AVF with thrill and bruit, healing incisions, warm R arm down to distal half of forearm, pink R  hand with intact CR < 2 sec  R forearm: dopplerable monophasic ulnar, dopplerable palmar arch signal, faint distal radial signal (damp mono), dopplerable proximal radial signal until distal 2/3 of radial course where no signal  Neurologic: CN 2-12 intact , Pain and light touch intact in extremities , Motor exam as listed above  Psychiatric: Judgement intact, Mood & affect appropriate for pt's clinical situation  Dermatologic: See M/S exam for extremity exam, No rashes otherwise noted  Lymph : Palpable lymph nodes: None  Laboratory: CBC:    Component Value Date/Time   WBC 4.9 11/10/2015 1604   RBC 3.08 (L) 11/10/2015 1604   HGB 10.4 (L) 11/10/2015 1604   HGB 11.5 (L) 04/10/2015 0938   HCT 32.2 (L) 11/10/2015 1604   HCT 34.3 (L) 04/10/2015 0938   PLT 226 11/10/2015 1604   PLT 194 04/10/2015 0938   MCV 104.5 (H) 11/10/2015 1604   MCV 100.9 04/10/2015 0938   MCH 33.8 11/10/2015 1604   MCHC 32.3 11/10/2015 1604   RDW 14.4 11/10/2015 1604   RDW 15.5 (H) 04/10/2015 0938   LYMPHSABS 1.7 11/10/2015 1604   LYMPHSABS 1.2 04/10/2015 0938   MONOABS 0.6 11/10/2015 1604   MONOABS 0.4 04/10/2015 0938   EOSABS 0.2 11/10/2015 1604   EOSABS 0.2 04/10/2015 0938   BASOSABS 0.0 11/10/2015 1604   BASOSABS 0.1 04/10/2015 0938    BMP:      Component Value Date/Time   NA 135 09/20/2015 1109   NA 135 (L) 04/10/2015 0938   K 4.1 09/20/2015 1109   K 4.5 04/10/2015 0938   CL 95 (L) 01/27/2014 0923   CL 97 (L) 10/16/2011 1425   CO2 23 04/10/2015 0938   GLUCOSE 190 (H) 09/20/2015 1109   GLUCOSE 273 (H) 04/10/2015 0938   GLUCOSE 296 (H) 10/16/2011 1425   BUN 54.5 (H) 04/10/2015 0938   CREATININE 5.0 (HH) 04/10/2015 0938   CALCIUM 9.1 04/10/2015 0938   GFRNONAA 8 (L) 04/08/2012 1416   GFRAA 10 (L) 04/08/2012 1416    Coagulation: Lab Results  Component Value Date   INR >10.0 (HH) 12/01/2013   No results found for: PTT  Lipids: No results found for: CHOL, TRIG, HDL, CHOLHDL, VLDL, LDLCALC, LDLDIRECT  Radiology: No results found.   Medical Decision Making  Bralynn Eugene Sado is a 75 y.o. female ESLD with cirrhosis, ESRD-HD who presents with: possible thromboembolism to R radial artery   Unfortunately, >6 hours have already elapsed since initial start of sx.  Hopefully, the persistent palmar perfusion via the ulnar artery will prevent any reperfusion injury.  Will attempt thrombectomy of right radial artery, though there is a possibility that this patient has chronic radial artery disease rather than thromboembolism.    Unclear to me why she would have acute onset of patient if her disease is chronic.  Based on the patient's vascular studies and examination, I have offered the patient: emergent thrombectomy right arm, other procedures as indicated. The risk, benefits, and alternative for this operation were discussed with the patient.   The patient is aware the risks include but are not limited to: bleeding, infection, myocardial infarction, stroke, limb loss, nerve damage, need for additional procedures in the future, wound complications, and inability to revascularize the radial artery due to chronic disease. The patient is aware of these risks and agreed to proceed.  Thank you for allowing [place] to participate in this  patient's care.   Adele Barthel, MD, FACS [facility] Office:  [phone] Pager: [phone]  11/10/2015, 4:11 PM

## 2015-11-10 NOTE — Progress Notes (Signed)
Stratus interpretation, in use with Dr. Ola Spurr & all nursing staff.   IV in the R foot, started in ED.

## 2015-11-11 LAB — HEPARIN LEVEL (UNFRACTIONATED)
Heparin Unfractionated: 0.19 IU/mL — ABNORMAL LOW (ref 0.30–0.70)
Heparin Unfractionated: 0.55 IU/mL (ref 0.30–0.70)

## 2015-11-11 LAB — CBC
HEMATOCRIT: 33 % — AB (ref 36.0–46.0)
Hemoglobin: 10.6 g/dL — ABNORMAL LOW (ref 12.0–15.0)
MCH: 33.8 pg (ref 26.0–34.0)
MCHC: 32.1 g/dL (ref 30.0–36.0)
MCV: 105.1 fL — ABNORMAL HIGH (ref 78.0–100.0)
Platelets: 213 10*3/uL (ref 150–400)
RBC: 3.14 MIL/uL — ABNORMAL LOW (ref 3.87–5.11)
RDW: 14.4 % (ref 11.5–15.5)
WBC: 4.5 10*3/uL (ref 4.0–10.5)

## 2015-11-11 LAB — BASIC METABOLIC PANEL
Anion gap: 13 (ref 5–15)
BUN: 23 mg/dL — AB (ref 6–20)
CALCIUM: 10.5 mg/dL — AB (ref 8.9–10.3)
CO2: 26 mmol/L (ref 22–32)
CREATININE: 4.7 mg/dL — AB (ref 0.44–1.00)
Chloride: 98 mmol/L — ABNORMAL LOW (ref 101–111)
GFR calc non Af Amer: 8 mL/min — ABNORMAL LOW (ref >60)
GFR, EST AFRICAN AMERICAN: 10 mL/min — AB (ref >60)
Glucose, Bld: 142 mg/dL — ABNORMAL HIGH (ref 65–99)
Potassium: 3.4 mmol/L — ABNORMAL LOW (ref 3.5–5.1)
SODIUM: 137 mmol/L (ref 135–145)

## 2015-11-11 LAB — GLUCOSE, CAPILLARY
Glucose-Capillary: 142 mg/dL — ABNORMAL HIGH (ref 65–99)
Glucose-Capillary: 149 mg/dL — ABNORMAL HIGH (ref 65–99)
Glucose-Capillary: 87 mg/dL (ref 65–99)

## 2015-11-11 MED ORDER — FENTANYL CITRATE (PF) 100 MCG/2ML IJ SOLN: 25.0000 ug | INTRAMUSCULAR | Status: DC | PRN

## 2015-11-11 MED ORDER — PROMETHAZINE HCL 25 MG/ML IJ SOLN: 6.2500 mg | INTRAMUSCULAR | Status: DC | PRN

## 2015-11-11 MED ORDER — SORBITOL 70 % SOLN
30.0000 mL | Freq: Every day | Status: DC
  Administered 2015-11-11: 30 mL via ORAL
  Filled 2015-11-11 (×3): qty 30

## 2015-11-11 MED ORDER — RENA-VITE PO TABS
1.0000 | ORAL_TABLET | Freq: Every day | ORAL | Status: DC
  Administered 2015-11-11 – 2015-11-12 (×2): 1 via ORAL
  Filled 2015-11-11 (×2): qty 1

## 2015-11-11 MED ORDER — SODIUM CHLORIDE 0.9 % IV SOLN
125.0000 mg | INTRAVENOUS | Status: DC
  Administered 2015-11-11: 125 mg via INTRAVENOUS
  Filled 2015-11-11: qty 10

## 2015-11-11 MED ORDER — OXYCODONE-ACETAMINOPHEN 5-325 MG PO TABS
ORAL_TABLET | ORAL | Status: AC
  Filled 2015-11-11: qty 2

## 2015-11-11 MED ORDER — PENTAFLUOROPROP-TETRAFLUOROETH EX AERO
INHALATION_SPRAY | CUTANEOUS | Status: AC
  Filled 2015-11-11: qty 103.5

## 2015-11-11 NOTE — Procedures (Signed)
I was present at this session.  I have reviewed the session itself and made appropriate changes.  Hd via LUA AVF.  Starting HD  [name] L 9/30/20171:46 PM

## 2015-11-11 NOTE — Consult Note (Signed)
[facility] Renal Consultation Note  Indication for Consultation:  Management of ESRD/hemodialysis; anemia, hypertension/volume and secondary hyperparathyroidism  HPI: [name] is a 75 y.o. female chronic HD sec DM (TTS ([place] [facility]) compliant with txs admitted with thromboembolism to R radial artery. Has had  revsion of AVF 8/09 17  secondary to aneurysm al development . Noted yesterday developed pain in R upper extremity  Came to ER Dr. Bridgett Larsson  eval revealed embolus , taken to O. R  for  Right radial artery thrombectomy .  This am denies pain  Post op last pm . Grandson and granddaughter in room and interpreting .    Past Medical History:  Diagnosis Date  . Atrial fibrillation (HCC)    on ASA  . Bladder cancer [facility]) Jan 10, 2006   BLADDER NECK, BIOPSY: DIFFUSE LARGE B-CELL LYMPHOMA. CT showed worrisome pelvic LN. No treatment records in EPIC.  Marland Kitchen Blind 2005  . Chronic liver disease    non-alcoholic cirrhosis  . Constipation   . Diabetes mellitus   . ESRD on dialysis [facility])    - Dialysis- [place]  . History of blood transfusion   . Hyperparathyroidism, secondary ([place])   . Hypertension   . Non-Hodgkin lymphoma ([place]) 2007  . Pulmonary HTN ([place])   . Radiation    for bladder cancer    Past Surgical History:  Procedure Laterality Date  . AV FISTULA PLACEMENT Right   . AV FISTULA PLACEMENT Left   . COLONOSCOPY    . FINGER SURGERY Left    Skin graft - skin graft  . INSERTION OF DIALYSIS CATHETER    . 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 Thyroid disease Mother   . Lung cancer Father   . Colon cancer Neg Hx   . Breast cancer Neg Hx       reports that she has never smoked. She has never used  smokeless tobacco. She reports that she does not drink alcohol or use drugs.   Allergies  Allergen Reactions  . No Known Allergies Other (See Comments)    Prior to Admission medications   Medication Sig Start Date End Date Taking? Authorizing Provider  acetaminophen (TYLENOL) 325 MG tablet Take 650 mg by mouth every 6 (six) hours as needed for moderate pain, fever or headache.     Historical Provider, MD  diphenhydrAMINE-zinc acetate (BENADRYL) cream Apply topically 3 (three) times daily as needed for itching. Patient not taking: Reported on 10/09/2015 04/10/15   Chauncey Cruel, MD  diphenhydrAMINE-zinc acetate (BENADRYL) cream Apply 1 application topically 3 (three) times daily as needed for itching.    Historical Provider, MD  glipiZIDE (GLUCOTROL) 5 MG tablet Take 5 mg by mouth 2 (two) times daily. 09/18/15   Historical Provider, MD  labetalol (NORMODYNE) 200 MG tablet Take 200 mg by mouth 2 (two) times daily.    Historical Provider, MD  omeprazole (PRILOSEC) 20 MG capsule Take 20 mg by mouth daily.  04/08/13   Historical Provider, MD  oxyCODONE-acetaminophen (ROXICET) 5-325 MG tablet Take 1-2 tablets by mouth every 6 (six) hours as needed for severe pain. 09/20/15   Kimberly A Trinh, PA-C  SENSIPAR 60 MG tablet Take 60 mg by mouth 2 (  two) times daily.  04/19/13   Historical Provider, MD  sorbitol 70 % solution take 30 milliliters by mouth once daily if needed for constipation 01/12/14   Midge Minium, MD     I have reviewed the patient's current medications. Prior to Admission:  Prescriptions Prior to Admission  Medication Sig Dispense Refill Last Dose  . acetaminophen (TYLENOL) 500 MG tablet Take 500 mg by mouth every 6 (six) hours as needed for headache (pain).    11/10/2015 at Unknown time  . cinacalcet (SENSIPAR) 60 MG tablet Take 60 mg by mouth 2 (two) times daily.   11/09/2015  . glipiZIDE (GLUCOTROL) 5 MG tablet Take 5 mg by mouth 2 (two) times daily.  0 11/09/2015  . labetalol  (NORMODYNE) 100 MG tablet Take 100 mg by mouth See admin instructions. Take 1 tablet (100 mg) by mouth Sunday, Monday, Wednesday, Friday morning (non-dialysis days), take 1 tablet (100 mg) daily at bedtime   11/09/2015 at 2200  . Multiple Vitamin (MULTIVITAMIN WITH MINERALS) TABS tablet Take 1 tablet by mouth daily.   11/09/2015  . omeprazole (PRILOSEC) 20 MG capsule Take 20 mg by mouth daily.    11/02/2015  . sevelamer carbonate (RENVELA) 800 MG tablet Take 1,600 mg by mouth 3 (three) times daily before meals.   11/09/2015  . sorbitol 70 % solution take 30 milliliters by mouth once daily if needed for constipation 300 mL 0 11/06/2015  . diphenhydrAMINE-zinc acetate (BENADRYL) cream Apply topically 3 (three) times daily as needed for itching. (Patient not taking: Reported on 11/11/2015) 28.3 g 0 Not Taking at Unknown time  . oxyCODONE-acetaminophen (ROXICET) 5-325 MG tablet Take 1-2 tablets by mouth every 6 (six) hours as needed for severe pain. (Patient not taking: Reported on 11/11/2015) 20 tablet 0 Not Taking at Unknown time    Results for orders placed or performed during the hospital encounter of 11/10/15 (from the past 48 hour(s))  CBC with Differential     Status: Abnormal   Collection Time: 11/10/15  4:04 PM  Result Value Ref Range   WBC 4.9 4.0 - 10.5 K/uL   RBC 3.08 (L) 3.87 - 5.11 MIL/uL   Hemoglobin 10.4 (L) 12.0 - 15.0 g/dL   HCT 32.2 (L) 36.0 - 46.0 %   MCV 104.5 (H) 78.0 - 100.0 fL   MCH 33.8 26.0 - 34.0 pg   MCHC 32.3 30.0 - 36.0 g/dL   RDW 14.4 11.5 - 15.5 %   Platelets 226 150 - 400 K/uL   Neutrophils Relative % 48 %   Neutro Abs 2.4 1.7 - 7.7 K/uL   Lymphocytes Relative 34 %   Lymphs Abs 1.7 0.7 - 4.0 K/uL   Monocytes Relative 13 %   Monocytes Absolute 0.6 0.1 - 1.0 K/uL   Eosinophils Relative 4 %   Eosinophils Absolute 0.2 0.0 - 0.7 K/uL   Basophils Relative 1 %   Basophils Absolute 0.0 0.0 - 0.1 K/uL  Comprehensive metabolic panel     Status: Abnormal   Collection Time:  11/10/15  4:04 PM  Result Value Ref Range   Sodium 135 135 - 145 mmol/L   Potassium 3.2 (L) 3.5 - 5.1 mmol/L   Chloride 97 (L) 101 - 111 mmol/L   CO2 28 22 - 32 mmol/L   Glucose, Bld 173 (H) 65 - 99 mg/dL   BUN 21 (H) 6 - 20 mg/dL   Creatinine, Ser 4.18 (H) 0.44 - 1.00 mg/dL   Calcium 10.7 (H) 8.9 -  10.3 mg/dL   Total Protein 6.9 6.5 - 8.1 g/dL   Albumin 3.1 (L) 3.5 - 5.0 g/dL   AST 25 15 - 41 U/L   ALT 13 (L) 14 - 54 U/L   Alkaline Phosphatase 81 38 - 126 U/L   Total Bilirubin 0.6 0.3 - 1.2 mg/dL   GFR calc non Af Amer 10 (L) >60 mL/min   GFR calc Af Amer 11 (L) >60 mL/min    Comment: (NOTE) The eGFR has been calculated using the CKD EPI equation. This calculation has not been validated in all clinical situations. eGFR's persistently <60 mL/min signify possible Chronic Kidney Disease.    Anion gap 10 5 - 15  APTT     Status: None   Collection Time: 11/10/15  4:04 PM  Result Value Ref Range   aPTT 24 24 - 36 seconds  Protime-INR     Status: Abnormal   Collection Time: 11/10/15  4:04 PM  Result Value Ref Range   Prothrombin Time 15.3 (H) 11.4 - 15.2 seconds   INR 1.21   Type and screen [place]     Status: None   Collection Time: 11/10/15  4:21 PM  Result Value Ref Range   ABO/RH(D) O POS    Antibody Screen NEG    Sample Expiration 11/13/2015   CG4 I-STAT (Lactic acid)     Status: None   Collection Time: 11/10/15  4:51 PM  Result Value Ref Range   Lactic Acid, Venous 1.29 0.5 - 1.9 mmol/L  Glucose, capillary     Status: Abnormal   Collection Time: 11/10/15  4:58 PM  Result Value Ref Range   Glucose-Capillary 174 (H) 65 - 99 mg/dL  Glucose, capillary     Status: Abnormal   Collection Time: 11/10/15  7:23 PM  Result Value Ref Range   Glucose-Capillary 164 (H) 65 - 99 mg/dL  CBC     Status: Abnormal   Collection Time: 11/11/15  5:26 AM  Result Value Ref Range   WBC 4.5 4.0 - 10.5 K/uL   RBC 3.14 (L) 3.87 - 5.11 MIL/uL   Hemoglobin 10.6 (L) 12.0  - 15.0 g/dL   HCT 33.0 (L) 36.0 - 46.0 %   MCV 105.1 (H) 78.0 - 100.0 fL   MCH 33.8 26.0 - 34.0 pg   MCHC 32.1 30.0 - 36.0 g/dL   RDW 14.4 11.5 - 15.5 %   Platelets 213 150 - 400 K/uL  Basic metabolic panel     Status: Abnormal   Collection Time: 11/11/15  5:26 AM  Result Value Ref Range   Sodium 137 135 - 145 mmol/L   Potassium 3.4 (L) 3.5 - 5.1 mmol/L   Chloride 98 (L) 101 - 111 mmol/L   CO2 26 22 - 32 mmol/L   Glucose, Bld 142 (H) 65 - 99 mg/dL   BUN 23 (H) 6 - 20 mg/dL   Creatinine, Ser 4.70 (H) 0.44 - 1.00 mg/dL   Calcium 10.5 (H) 8.9 - 10.3 mg/dL   GFR calc non Af Amer 8 (L) >60 mL/min   GFR calc Af Amer 10 (L) >60 mL/min    Comment: (NOTE) The eGFR has been calculated using the CKD EPI equation. This calculation has not been validated in all clinical situations. eGFR's persistently <60 mL/min signify possible Chronic Kidney Disease.    Anion gap 13 5 - 15  Glucose, capillary     Status: Abnormal   Collection Time: 11/11/15  6:41 AM  Result Value Ref Range   Glucose-Capillary 142 (H) 65 - 99 mg/dL   Comment 1 Notify RN   Heparin level (unfractionated)     Status: Abnormal   Collection Time: 11/11/15  8:09 AM  Result Value Ref Range   Heparin Unfractionated 0.19 (L) 0.30 - 0.70 IU/mL    Comment:        IF HEPARIN RESULTS ARE BELOW EXPECTED VALUES, AND PATIENT DOSAGE HAS BEEN CONFIRMED, SUGGEST FOLLOW UP TESTING OF ANTITHROMBIN III LEVELS.   Glucose, capillary     Status: Abnormal   Collection Time: 11/11/15 11:41 AM  Result Value Ref Range   Glucose-Capillary 149 (H) 65 - 99 mg/dL   EKG: not eval.  ROS:  Negative except pain in wrist  Physical Exam: Vitals:   11/11/15 0549 11/11/15 0957  BP: (!) 117/34 (!) 109/35  Pulse: 61 60  Resp: 18   Temp: 99.1 F (37.3 C)      General: NAD, elderly female with language barrier but family very helpful2 HEENT: fundi benign, pharynx with moist, membranes no lesions Eyes: PERRLA, fundi benigh Neck: PCL Heart:  S4, Gr 2/6 SEM ULSB, Pmi 11 cm lat to MSL 5th ICS,  Lungs: no R,R,W, good air movement Abdomen: mild distension, striae , pos bs,soft Extremities: op procedure with dressing R wrist, AVF with recent repair LUA, B&T Skin: thin bruises,  Dialysis Access: AVF as above  Dialysis Orders: Center: [facility]   on TTS . EDW 54 kg HD Bath 2k, 2.25 ca  Time 3hr Heparin none. Access LUA AVF BFR 400  DFR AF1.5    Hec 8 mcg IV/HD  Mircera 80mg q 2 wks last on 11/02/15     Other op lbas 2/21 hgb 10.4 tfs 20% .  Corec ac 11.1 phos 4.5 Pth 299<323  Assessment/Plan  1. ESRD -  HD today on TTS schedule /  2. Thromboembolism right distal radial artery/ per [facility] Dr. CBridgett Larsson On Iv Heparin sp R Radial artery Thrombectomy  3. Hypertension/volume  -  Per wt (BED ) 5 kg >edw,   bp 109/35 check stand wt  If cannot uf 2liter as op rarely gains more 3.5 l / no op bp meds Will do longer, slower UFR 4. Anemia  -hgb 10.6 next ESA per schedule next week  Fe def on op recnt labs 20% start 5 ts Venofer load on hd / fu hgb  5. Metabolic bone disease -   Ca/ Corec ca ^ 11.2  Hold hector ol / Renvela as binder , continue Sensipar at [id number] day Review meds 6. Nutrition - renal  Carb mod / renal vit. 7. DM type 2 -  8. HO Bladder Ca (sp Radiation [place]) 9. Non hodgkin Lymphoma   DaErnest HaberPA-C [facility] [phone]/30/2017, 12:23 PM I have seen and examined this patient and agree with the plan of care seen, eval, family counseled ,exam is mine. Discussed with PA,  .  Mitsuko Luera L 11/11/2015, 1:29 PM

## 2015-11-11 NOTE — Progress Notes (Signed)
ANTICOAGULATION CONSULT NOTE - F/U  Pharmacy Consult for heparin  Indication: Afib / DVT  - s/p embolectomy    Allergies  Allergen Reactions  . No Known Allergies Other (See Comments)   Patient Measurements: Height: 5' (152.4 cm) Weight: 121 lb 11.1 oz (55.2 kg) IBW/kg (Calculated) : 45.5 Heparin Dosing Weight: 59.4 kg   Vital Signs: Temp: 99.5 F (37.5 C) (09/30 1957) Temp Source: Oral (09/30 1957) BP: 103/29 (09/30 1957) Pulse Rate: 59 (09/30 1957)  Labs:  Recent Labs  11/10/15 1604 11/11/15 0526 11/11/15 0809 11/11/15 1814  HGB 10.4* 10.6*  --   --   HCT 32.2* 33.0*  --   --   PLT 226 213  --   --   APTT 24  --   --   --   LABPROT 15.3*  --   --   --   INR 1.21  --   --   --   HEPARINUNFRC  --   --  0.19* 0.55  CREATININE 4.18* 4.70*  --   --     Estimated Creatinine Clearance: 8.3 mL/min (by C-G formula based on SCr of 4.7 mg/dL (H)).   Medical History: Past Medical History:  Diagnosis Date  . Atrial fibrillation (HCC)    on ASA  . Bladder cancer [facility]) Jan 10, 2006   BLADDER NECK, BIOPSY: DIFFUSE LARGE B-CELL LYMPHOMA. CT showed worrisome pelvic LN. No treatment records in EPIC.  Marland Kitchen Blind 2005  . Chronic liver disease    non-alcoholic cirrhosis  . Constipation   . Diabetes mellitus   . ESRD on dialysis [facility])    - Dialysis- [place]  . History of blood transfusion   . Hyperparathyroidism, secondary ([place])   . Hypertension   . Non-Hodgkin lymphoma ([place]) 2007  . Pulmonary HTN ([place])   . 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 Now s/p thrombectomy and pharmacy to dose heparin starting last night at MIDNIGHT without bolus. Patient with ESRD on HD TTS and has history of Afib. Per notes, not on oral anticoagulation PTA due to HD and history cirrhosis.    Heparin level is therapeutic this evening at 0.55 after most recent rate increase to 1050  units/hr.    Goal of Therapy:  Heparin level 0.3-0.7 units/ml Monitor platelets by anticoagulation protocol: Yes   Plan:  1. Continue heparin gtt to 1050 units/hr  2. Anti-Xa level in am to serve as confirmatory  3. Continue to monitor H&H and platelets  Vincenza Hews, PharmD, BCPS 11/11/2015, 8:01 PM Pager: [phone]

## 2015-11-11 NOTE — Progress Notes (Addendum)
Progress Note    11/11/2015 8:03 AM 1 Day Post-Op  Subjective:  No pain in her right hand  Tm 99.1 HR 40's-50's Afib 110's-150's systolic 95% 2LO2NC  Vitals:   11/10/15 2037 11/11/15 0549  BP: (!) 159/29 (!) 117/34  Pulse: (!) 50 61  Resp: 20 18  Temp: 97.8 F (36.6 C) 99.1 F (37.3 C)    Physical Exam: Cardiac:  Irregular Lungs:  Non labored Incisions:  Clean and dry  Extremities:  Easily palpable right radial pulse;  Good grip strength right hand   CBC    Component Value Date/Time   WBC 4.5 11/11/2015 0526   RBC 3.14 (L) 11/11/2015 0526   HGB 10.6 (L) 11/11/2015 0526   HGB 11.5 (L) 04/10/2015 0938   HCT 33.0 (L) 11/11/2015 0526   HCT 34.3 (L) 04/10/2015 0938   PLT 213 11/11/2015 0526   PLT 194 04/10/2015 0938   MCV 105.1 (H) 11/11/2015 0526   MCV 100.9 04/10/2015 0938   MCH 33.8 11/11/2015 0526   MCHC 32.1 11/11/2015 0526   RDW 14.4 11/11/2015 0526   RDW 15.5 (H) 04/10/2015 0938   LYMPHSABS 1.7 11/10/2015 1604   LYMPHSABS 1.2 04/10/2015 0938   MONOABS 0.6 11/10/2015 1604   MONOABS 0.4 04/10/2015 0938   EOSABS 0.2 11/10/2015 1604   EOSABS 0.2 04/10/2015 0938   BASOSABS 0.0 11/10/2015 1604   BASOSABS 0.1 04/10/2015 0938    BMET    Component Value Date/Time   NA 137 11/11/2015 0526   NA 135 (L) 04/10/2015 0938   K 3.4 (L) 11/11/2015 0526   K 4.5 04/10/2015 0938   CL 98 (L) 11/11/2015 0526   CL 97 (L) 10/16/2011 1425   CO2 26 11/11/2015 0526   CO2 23 04/10/2015 0938   GLUCOSE 142 (H) 11/11/2015 0526   GLUCOSE 273 (H) 04/10/2015 0938   GLUCOSE 296 (H) 10/16/2011 1425   BUN 23 (H) 11/11/2015 0526   BUN 54.5 (H) 04/10/2015 0938   CREATININE 4.70 (H) 11/11/2015 0526   CREATININE 5.0 (HH) 04/10/2015 0938   CALCIUM 10.5 (H) 11/11/2015 0526   CALCIUM 9.1 04/10/2015 0938   GFRNONAA 8 (L) 11/11/2015 0526   GFRAA 10 (L) 11/11/2015 0526    INR    Component Value Date/Time   INR 1.21 11/10/2015 1604     Intake/Output Summary (Last 24 hours)  at 11/11/15 0803 Last data filed at 11/10/15 2000  Gross per 24 hour  Intake              525 ml  Output               10 ml  Net              515 ml     Assessment:  75 y.o. female is s/p:  Right radial artery thrombectomy  1 Day Post-Op  Plan: -spoke with pt via Environmental manager.  She does not have any pain in her hand.  Good grip strength in right hand.  She does have a palpable right radial pulse this morning -incision looks good -afib-pt on heparin.  Not a candidate for NOAC given both liver and renal dz -2D echo ordered -Family Medicine/hospitalist to resume care this am for this complicated pt with chronic medical problems. -will need HD today-I have spoken with Dr. Darrick Penna and he will consult on her as she dialyzes T/T/S & today is her HD day. -will continue to follow.    Doreatha Massed, PA-C [facility] [phone] 11/11/2015 8:03 AM  Addendum  I have independently interviewed and examined the patient, and I agree with the physician assistant's findings.  Will need TTE to evaluate for intra-cardiac thrombus given findings of the thrombectomy.  I doubt this is in-situ thrombosis based on the character of the clot.  I think the question of long-term anticoagulation will depend on the overall functional status of this sick patient with multiple active co-morbidities.    - If TTE is positive for cardiac thrombus -> anticoag - If TTE is negative -> risk-benefit analysis and patient's preference will be important.  Minimally pt needs to be on an anti-platelet agent. - Agree that a palliative care discussion may be helpful in determine the long-term goals of her care.  Adele Barthel, MD, FACS [facility] [place] Office: [phone] Pager: [phone]  11/11/2015, 11:15 AM

## 2015-11-11 NOTE — Progress Notes (Signed)
PROGRESS NOTE    [name]  [id number] DOB: 01/23/1941 DOA: 11/10/2015 PCP: Kathlene November, MD    Brief Narrative:   Assessment & Plan:   Principal Problem:   Thromboembolism (HCC) Active Problems:   ESRD (end stage renal disease) on dialysis ([place])   Diabetes mellitus type II, non insulin dependent ([place])   Hypertension   Non-Hodgkin's lymphoma of extranodal site (HCC)   Cirrhosis, non-alcoholic (HCC)   Atrial fibrillation (HCC)   Cardiac murmur   Thromboembolism s/p thrombectomy of right radial artery -Patient with improved pain/numbness s/p thrombectomy -Will need anticoagulation, likely lifelong -Heparin drip for now -plan to do ECHO, if positive for cardiac thrombus , patient will need anticoagulation. If TTE is negative will have to consider risk benefit and patient preference.   ESRD -Nephrology consult line notified of admission, as patient has Tu/Th/Sa HD and will need maintenance dialysis tomorrow   Afib -Bradycardia, so rate controlled - this is likely related to Labetolol -Not previously on anticoagulation, possibly not even ASA (not listed on her medication list) -CHA2DS2-VASc score is 5, indicating a 6.7%/year adjusted stroke rate.  HTN -BP erratic - as low as 99/32 and as high as 159/29 -Will follow  DM -On Glucotrol -Holding PO med, cover with SSI - A1c, pending   Cirrhosis -NASH, no evidence of lymphoma in ascites fluid in 2/15 -Stable LFTs at this time  NHL -Patient followed by Dr. 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  Family  Communication: discussed care with family, explain to them difficult situation regarding anticoagulation and patient multiples medical condition, and specially cirrhosis and patient high risk of been on anticoagulation. I will continue to discussed with patient and family medical condition. Will await ECHO results.  Disposition Plan: home at time of discharge.    Consultants:   Dr Bridgett Larsson  Renal.    Procedures:   ECHO pending.    Antimicrobials  none   Subjective: She complaint of cramps in her leg, wants to change position. She wants to go home.   Objective: Vitals:   11/10/15 2001 11/10/15 2037 11/11/15 0549 11/11/15 0957  BP:  (!) 159/29 (!) 117/34 (!) 109/35  Pulse: (!) 47 (!) 50 61 60  Resp: 19 20 18    Temp:  97.8 F (36.6 C) 99.1 F (37.3 C)   TempSrc:  Axillary Oral   SpO2: 100% 100% 95%   Weight:  59.4 kg (130 lb 15.3 oz)    Height:  5' (1.524 m)      Intake/Output Summary (Last 24 hours) at 11/11/15 1018 Last data filed at 11/10/15 2000  Gross per 24 hour  Intake              525 ml  Output               10 ml  Net              515 ml   Filed Weights   11/10/15 2037  Weight: 59.4 kg (130 lb 15.3 oz)    Examination:  General exam: Appears calm and comfortable  Respiratory system: Clear  to auscultation. Respiratory effort normal. Cardiovascular system: S1 & S2 heard, RRR. No JVD, murmurs, rubs, gallops or clicks. No pedal edema. Gastrointestinal system: Abdomen is distended, soft and nontender. No organomegaly or masses felt. Normal bowel sounds heard. Central nervous system: Alert and oriented. No focal neurological deficits. Extremities: Symmetric 5 x 5 power. Right arm with clean incision.  Skin: No rashes, lesions or ulcers     Data Reviewed: I have personally reviewed following labs and imaging studies  CBC:  Recent Labs Lab 11/10/15 1604 11/11/15 0526  WBC 4.9 4.5  NEUTROABS 2.4  --   HGB 10.4* 10.6*  HCT 32.2* 33.0*  MCV 104.5*  105.1*  PLT 226 123456   Basic Metabolic Panel:  Recent Labs Lab 11/10/15 1604 11/11/15 0526  NA 135 137  K 3.2* 3.4*  CL 97* 98*  CO2 28 26  GLUCOSE 173* 142*  BUN 21* 23*  CREATININE 4.18* 4.70*  CALCIUM 10.7* 10.5*   GFR: Estimated Creatinine Clearance: 8.6 mL/min (by C-G formula based on SCr of 4.7 mg/dL (H)). Liver Function Tests:  Recent Labs Lab 11/10/15 1604  AST 25  ALT 13*  ALKPHOS 81  BILITOT 0.6  PROT 6.9  ALBUMIN 3.1*   No results for input(s): LIPASE, AMYLASE in the last 168 hours. No results for input(s): AMMONIA in the last 168 hours. Coagulation Profile:  Recent Labs Lab 11/10/15 1604  INR 1.21   Cardiac Enzymes: No results for input(s): CKTOTAL, CKMB, CKMBINDEX, TROPONINI in the last 168 hours. BNP (last 3 results) No results for input(s): PROBNP in the last 8760 hours. HbA1C: No results for input(s): HGBA1C in the last 72 hours. CBG:  Recent Labs Lab 11/10/15 1658 11/10/15 1923 11/11/15 0641  GLUCAP 174* 164* 142*   Lipid Profile: No results for input(s): CHOL, HDL, LDLCALC, TRIG, CHOLHDL, LDLDIRECT in the last 72 hours. Thyroid Function Tests: No results for input(s): TSH, T4TOTAL, FREET4, T3FREE, THYROIDAB in the last 72 hours. Anemia Panel: No results for input(s): VITAMINB12, FOLATE, FERRITIN, TIBC, IRON, RETICCTPCT in the last 72 hours. Sepsis Labs:  Recent Labs Lab 11/10/15 1651  LATICACIDVEN 1.29    No results found for this or any previous visit (from the past 240 hour(s)).       Radiology Studies: No results found.      Scheduled Meds: . cinacalcet  60 mg Oral BID WC  . insulin aspart  0-15 Units Subcutaneous TID WC  . labetalol  200 mg Oral BID  . pantoprazole  40 mg Oral Daily   Continuous Infusions: . sodium chloride 75 mL/hr at 11/10/15 2323  . heparin 1,050 Units/hr (11/11/15 0902)     LOS: 1 day    Time spent: 35 minutes.     Elmarie Shiley, MD [facility] Pager  [phone]  If 7PM-7AM, please contact night-coverage [email or web address] Password [email or web address] 11/11/2015, 10:18 AM

## 2015-11-11 NOTE — Anesthesia Postprocedure Evaluation (Signed)
Anesthesia Post Note  Patient: [name]  Procedure(s) Performed: Procedure(s) (LRB): RIGHT THROMBECTOMY BRACHIAL ARTERY (Right)  Patient location during evaluation: PACU Anesthesia Type: MAC Level of consciousness: awake and alert Pain management: pain level controlled Vital Signs Assessment: post-procedure vital signs reviewed and stable Respiratory status: spontaneous breathing, nonlabored ventilation, respiratory function stable and patient connected to nasal cannula oxygen Cardiovascular status: stable and blood pressure returned to baseline Anesthetic complications: no    Last Vitals:  Vitals:   11/10/15 2037 11/11/15 0549  BP: (!) 159/29 (!) 117/34  Pulse: (!) 50 61  Resp: 20 18  Temp: 36.6 C 37.3 C    Last Pain:  Vitals:   11/11/15 0549  TempSrc: Oral  PainSc:                  Effie Berkshire

## 2015-11-11 NOTE — Progress Notes (Signed)
ANTICOAGULATION CONSULT NOTE - F/U  Pharmacy Consult for heparin  Indication: Afib / DVT  - s/p embolectomy    Allergies  Allergen Reactions  . No Known Allergies Other (See Comments)   Patient Measurements: Height: 5' (152.4 cm) Weight: 130 lb 15.3 oz (59.4 kg) IBW/kg (Calculated) : 45.5 Heparin Dosing Weight: 59.4 kg   Vital Signs: Temp: 99.1 F (37.3 C) (09/30 0549) Temp Source: Oral (09/30 0549) BP: 117/34 (09/30 0549) Pulse Rate: 61 (09/30 0549)  Labs:  Recent Labs  11/10/15 1604 11/11/15 0526 11/11/15 0809  HGB 10.4* 10.6*  --   HCT 32.2* 33.0*  --   PLT 226 213  --   APTT 24  --   --   LABPROT 15.3*  --   --   INR 1.21  --   --   HEPARINUNFRC  --   --  0.19*  CREATININE 4.18* 4.70*  --     Estimated Creatinine Clearance: 8.6 mL/min (by C-G formula based on SCr of 4.7 mg/dL (H)).   Medical History: Past Medical History:  Diagnosis Date  . Atrial fibrillation (HCC)    on ASA  . Bladder cancer [facility]) Jan 10, 2006   BLADDER NECK, BIOPSY: DIFFUSE LARGE B-CELL LYMPHOMA. CT showed worrisome pelvic LN. No treatment records in EPIC.  Marland Kitchen Blind 2005  . Chronic liver disease    non-alcoholic cirrhosis  . Constipation   . Diabetes mellitus   . ESRD on dialysis [facility])    - Dialysis- [place]  . History of blood transfusion   . Hyperparathyroidism, secondary ([place])   . Hypertension   . Non-Hodgkin lymphoma ([place]) 2007  . Pulmonary HTN ([place])   . 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 Now s/p thrombectomy and pharmacy to dose heparin starting last night at MIDNIGHT without bolus. Patient with ESRD on HD TTS and has history of Afib. Per notes, not on oral anticoagulation PTA due to HD and history cirrhosis.    Heparin level subtherapeutic this AM at 0.19. H&H low, but stable. Patelets within normal limits. No overt signs or symptoms of bleeding noted.    Goal of Therapy:  Heparin level 0.3-0.7 units/ml Monitor platelets by anticoagulation protocol: Yes   Plan:  Increase heparin gtt to 1050 units/hr  Check anti-Xa level in 8 hours and daily while on heparin Continue to monitor H&H and platelets  Argie Ramming, PharmD Pharmacy Resident  Pager [phone] 11/11/15 8:55 AM

## 2015-11-11 NOTE — H&P (Signed)
   See Consult Note on chart from ED  Adele Barthel, MD, [place] [facility] [place] Office: [phone] Pager: [phone]  11/11/2015, 9:51 AM

## 2015-11-12 ENCOUNTER — Encounter (HOSPITAL_COMMUNITY): Payer: Self-pay | Admitting: Vascular Surgery

## 2015-11-12 LAB — GLUCOSE, CAPILLARY
GLUCOSE-CAPILLARY: 148 mg/dL — AB (ref 65–99)
GLUCOSE-CAPILLARY: 162 mg/dL — AB (ref 65–99)
Glucose-Capillary: 145 mg/dL — ABNORMAL HIGH (ref 65–99)

## 2015-11-12 LAB — CBC
HCT: 31.7 % — ABNORMAL LOW (ref 36.0–46.0)
HEMOGLOBIN: 10.2 g/dL — AB (ref 12.0–15.0)
MCH: 33.7 pg (ref 26.0–34.0)
MCHC: 32.2 g/dL (ref 30.0–36.0)
MCV: 104.6 fL — ABNORMAL HIGH (ref 78.0–100.0)
Platelets: 190 10*3/uL (ref 150–400)
RBC: 3.03 MIL/uL — ABNORMAL LOW (ref 3.87–5.11)
RDW: 14.3 % (ref 11.5–15.5)
WBC: 4.2 10*3/uL (ref 4.0–10.5)

## 2015-11-12 LAB — BASIC METABOLIC PANEL
Anion gap: 11 (ref 5–15)
BUN: 12 mg/dL (ref 6–20)
CALCIUM: 10.1 mg/dL (ref 8.9–10.3)
CHLORIDE: 99 mmol/L — AB (ref 101–111)
CO2: 26 mmol/L (ref 22–32)
CREATININE: 3.32 mg/dL — AB (ref 0.44–1.00)
GFR, EST AFRICAN AMERICAN: 15 mL/min — AB (ref >60)
GFR, EST NON AFRICAN AMERICAN: 13 mL/min — AB (ref >60)
Glucose, Bld: 100 mg/dL — ABNORMAL HIGH (ref 65–99)
Potassium: 3.7 mmol/L (ref 3.5–5.1)
SODIUM: 136 mmol/L (ref 135–145)

## 2015-11-12 LAB — HEMOGLOBIN AND HEMATOCRIT, BLOOD
HCT: 30.8 % — ABNORMAL LOW (ref 36.0–46.0)
HEMOGLOBIN: 10 g/dL — AB (ref 12.0–15.0)

## 2015-11-12 LAB — HEMOGLOBIN A1C
HEMOGLOBIN A1C: 8 % — AB (ref 4.8–5.6)
Mean Plasma Glucose: 183 mg/dL

## 2015-11-12 LAB — HEPARIN LEVEL (UNFRACTIONATED): HEPARIN UNFRACTIONATED: 0.5 [IU]/mL (ref 0.30–0.70)

## 2015-11-12 NOTE — Progress Notes (Signed)
Asked to evaluate pt's arm for complaints of redness, swelling and tenderness.  Pt's arm does have mild swelling around the incision site and is soft.  Most likely some oozing given her anticoagulation.  Via translator, family states it started this morning, but really hasn't changed throughout the day.    Per the RN, she did give the pt a Percocet about 3 hrs ago and she tolerated well.  There is some ecchymosis around the incision and I really don't appreciate any erythema.  She continues to have a palpable right radial pulse and motor and sensation are in tact.    Her blood pressure has been stable throughout the day-- 0000000 systolic. She is 98% on RA.    The RN will continue to monitor.  Will use warm compresses on the incision.  Continue heparin.  Okay for the pt to use soap and water on the incision.   Leontine Locket, [facility] 11/12/2015 5:19 PM

## 2015-11-12 NOTE — Progress Notes (Addendum)
Progress Note    11/12/2015 7:38 AM 2 Days Post-Op  Subjective:  Denies pain in her hand  Tm 99.5 now 99.2 HR 50's Afib [id number] systolic [id number] RA  Gtts; heparin   Vitals:   11/11/15 1957 11/12/15 0713  BP: (!) 103/29 (!) 142/33  Pulse: (!) 59 (!) 52  Resp: 18 18  Temp: 99.5 F (37.5 C) 99.2 F (37.3 C)    Physical Exam: Cardiac:  irregular Lungs:  Non labored Incisions:  Clean and dry Extremities:  +palpable right radial pulse   CBC    Component Value Date/Time   WBC 4.5 11/11/2015 0526   RBC 3.14 (L) 11/11/2015 0526   HGB 10.6 (L) 11/11/2015 0526   HGB 11.5 (L) 04/10/2015 0938   HCT 33.0 (L) 11/11/2015 0526   HCT 34.3 (L) 04/10/2015 0938   PLT 213 11/11/2015 0526   PLT 194 04/10/2015 0938   MCV 105.1 (H) 11/11/2015 0526   MCV 100.9 04/10/2015 0938   MCH 33.8 11/11/2015 0526   MCHC 32.1 11/11/2015 0526   RDW 14.4 11/11/2015 0526   RDW 15.5 (H) 04/10/2015 0938   LYMPHSABS 1.7 11/10/2015 1604   LYMPHSABS 1.2 04/10/2015 0938   MONOABS 0.6 11/10/2015 1604   MONOABS 0.4 04/10/2015 0938   EOSABS 0.2 11/10/2015 1604   EOSABS 0.2 04/10/2015 0938   BASOSABS 0.0 11/10/2015 1604   BASOSABS 0.1 04/10/2015 0938    BMET    Component Value Date/Time   NA 137 11/11/2015 0526   NA 135 (L) 04/10/2015 0938   K 3.4 (L) 11/11/2015 0526   K 4.5 04/10/2015 0938   CL 98 (L) 11/11/2015 0526   CL 97 (L) 10/16/2011 1425   CO2 26 11/11/2015 0526   CO2 23 04/10/2015 0938   GLUCOSE 142 (H) 11/11/2015 0526   GLUCOSE 273 (H) 04/10/2015 0938   GLUCOSE 296 (H) 10/16/2011 1425   BUN 23 (H) 11/11/2015 0526   BUN 54.5 (H) 04/10/2015 0938   CREATININE 4.70 (H) 11/11/2015 0526   CREATININE 5.0 (HH) 04/10/2015 0938   CALCIUM 10.5 (H) 11/11/2015 0526   CALCIUM 9.1 04/10/2015 0938   GFRNONAA 8 (L) 11/11/2015 0526   GFRAA 10 (L) 11/11/2015 0526    INR    Component Value Date/Time   INR 1.21 11/10/2015 1604     Intake/Output Summary (Last 24 hours) at 11/12/15  0738 Last data filed at 11/11/15 1720  Gross per 24 hour  Intake                0 ml  Output             2000 ml  Net            -2000 ml     Assessment:  75 y.o. female is s/p:  Right radial artery thrombectomy   2 Days Post-Op  Plan: -pt doing well this am with a palpable right radial pulse -incision looks good -denies any pain in her hand. -complete 2D echo ordered, but not completed yet -DVT prophylaxis:  Heparin -pt was dialyzed yesterday   Leontine Locket, PA-C [facility] [phone] 11/12/2015 7:38 AM  Addendum  I have independently interviewed and examined the patient, and I agree with the physician assistant's findings.  Echo pending.  If cardiac thrombus present -> anticoagulation, other consideration of the greater picture with multiple medical problems may be in order  Adele Barthel, MD, [facility] [place]: [phone] Pager: [phone]  11/12/2015,  8:30 AM

## 2015-11-12 NOTE — Progress Notes (Signed)
[facility] Progress Note   Assessment/ Plan:    Dialysis Orders: Center: [facility]   on TTS . EDW 54 kg HD Bath 2k, 2.25 ca  Time 3hr Heparin none. Access LUA AVF BFR 400  DFR AF1.5    Hec 8 mcg IV/HD  Mircera 28mcg q 2 wks last on 11/02/15     Other op lbas 2/21 hgb 10.4 tfs 20% .  Corec ac 11.1 phos 4.5 Pth 299<323   1. ESRD -  HD 9/30  on TTS schedule.  Next Tuesday 10/3 next dialysis if still in house 2. Thromboembolism right distal radial artery/ per [facility] Dr. Bridgett Larsson  On IV Heparin sp R Radial artery Thrombectomy , TTE pending. 3. Hypertension/volume  -  Per wt (BED) 5 kg >edw,   bp 109/35 check stand wt  If cannot uf 2liter as op rarely gains more 3.5 l / no op bp meds.  Will get standing wts, floor wt this AM 55.2 kg.  Got 2L UF off 9/30. 4. Anemia  -hgb 10.6 next ESA per schedule next week  Fe def on op recnt labs 20% start 5 ts Venofer load on hd / fu hgb  5. Metabolic bone disease -   Ca/ Corec ca ^ 11.2  Hold hector ol / Renvela as binder , continue Sensipar at 60mg  q day, will need to review meds 6. Nutrition - renal  Carb mod / renal vit. 7. DM type 2 - per primary 8. HO Bladder Ca (sp Radiation [place]) 9. Non hodgkin Lymphoma   Subjective:    Pt feels well this AM .lots of cramping in dialysis yesterday and she does not want to go back to the unit on Tuesday if she's still an inpatient.  Her hand isn't painful.   Objective:   BP (!) 142/33 (BP Location: Right Arm)   Pulse (!) 52   Temp 99.2 F (37.3 C) (Oral)   Resp 18   Ht 5' (1.524 m)   Wt 55.2 kg (121 lb 11.1 oz)   SpO2 98%   BMI 23.77 kg/m   Physical Exam: General: NAD, elderly female  HEENT:  pharynx with moist, membranes no lesions Neck: no JVD Heart: S4, Gr 2/6 SEM LUSB, displaced PMI Lungs: normal WOB Abdomen: no distention this AM striae , pos bs,soft Extremities: R FA incision without bleeding, c/d/i.  R hand warm and well perfused with excellent radial pulse and good cap refill Skin:  thin bruises Dialysis Access: AVF as above  Labs: BMET  Recent Labs Lab 11/10/15 1604 11/11/15 0526 11/12/15 0811  NA 135 137 136  K 3.2* 3.4* 3.7  CL 97* 98* 99*  CO2 28 26 26   GLUCOSE 173* 142* 100*  BUN 21* 23* 12  CREATININE 4.18* 4.70* 3.32*  CALCIUM 10.7* 10.5* 10.1   CBC  Recent Labs Lab 11/10/15 1604 11/11/15 0526 11/12/15 0811  WBC 4.9 4.5 4.2  NEUTROABS 2.4  --   --   HGB 10.4* 10.6* 10.2*  HCT 32.2* 33.0* 31.7*  MCV 104.5* 105.1* 104.6*  PLT 226 213 190    @IMGRELPRIORS @ Medications:    . cinacalcet  60 mg Oral BID WC  . [START ON 11/14/2015] ferric gluconate (FERRLECIT/NULECIT) IV  125 mg Intravenous Q T,Th,Sa-HD  . insulin aspart  0-15 Units Subcutaneous TID WC  . multivitamin  1 tablet Oral QHS  . pantoprazole  40 mg Oral Daily  . sorbitol  30 mL Oral Daily     Benjamine Mola  Hollie Salk MD [facility] Cell [phone] pgr: [phone] 11/12/2015, 9:45 AM

## 2015-11-12 NOTE — Progress Notes (Signed)
Pt. Arm is red, swollen, warm, and tender. Paged Chen at [phone] and he said he would see her after 2 emergent patients. CN- Erasmo Downer informed will monitor Q20 min.

## 2015-11-12 NOTE — Progress Notes (Signed)
PROGRESS NOTE    PHI CEREZO  [id number] DOB: 15-Feb-1940 DOA: 11/10/2015 PCP: Kathlene November, MD    Brief Narrative: [name] is an 75 y.o. female with ESRD.  Clot s/p thrombectomy.  Presented to ER today with pain in arm that started this AM.  Pain was in right arm and was constant in her palm area.  Pain felt like cramps, numb, 5/10 pain.  Nothing made the pain better or worse.  Was seen at urgent care and they were unable to appreciate a radial pulse and so transferred the patient to the ER.  [place] confirmed no obvious radial artery flow and the patient was taken for R radial artery thrombectomy by Dr. Bridgett Larsson.  Now, s/p thrombectomy, the patient has still has some pain in her hand, but better now than prior.  Known h/o afib, does not take anticoagulants other than ASA. CHA2DS2-VASc score is 5, indicating a 6.7%/year adjusted stroke rate.    Assessment & Plan:   Principal Problem:   Thromboembolism (HCC) Active Problems:   ESRD (end stage renal disease) on dialysis ([place])   Diabetes mellitus type II, non insulin dependent ([place])   Hypertension   Non-Hodgkin's lymphoma of extranodal site (HCC)   Cirrhosis, non-alcoholic (HCC)   Atrial fibrillation (HCC)   Cardiac murmur   Thromboembolism s/p thrombectomy of right radial artery -Patient with improved pain/numbness s/p thrombectomy -Heparin drip for now -plan to do ECHO, if positive for cardiac thrombus , patient will need anticoagulation. If TTE is negative will have to consider risk benefit and patient preference.   ESRD -Nephrology consult line notified of admission, as patient has Tu/Th/Sa HD Had HD Saturday.   Afib -Bradycardia, so rate controlled - this is likely related to Labetolol -Not previously on anticoagulation, possibly not even ASA (not listed on her medication list) -CHA2DS2-VASc score is 5, indicating a 6.7%/year adjusted stroke rate.  HTN -BP erratic - as low as 99/32 and as high as 159/29 -Will  follow  DM -On Glucotrol -Holding PO med, cover with SSI - A1c, pending   Cirrhosis -NASH, no evidence of lymphoma in ascites fluid in 2/15 -Stable LFTs at this time  NHL -Patient followed by Dr. 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  Family Communication: discussed care with family, explain to them difficult situation regarding anticoagulation and patient multiples medical condition, and specially cirrhosis and patient high risk of been on anticoagulation. I will continue to discussed with patient and family medical condition. Will await ECHO results. They have decline at this time palliative care consultation.  Disposition Plan: home at time of discharge.    Consultants:   Dr Bridgett Larsson  Renal.    Procedures:   ECHO pending.    Antimicrobials  none   Subjective: Feeling well, no new complaints. She was able to have a bowel movement.   Objective: Vitals:   11/11/15 1720 11/11/15 1957 11/12/15 0713 11/12/15 1420  BP: (!) 128/46 (!) 103/29 (!) 142/33 (!) 132/37  Pulse: (!) 55 (!) 59 (!) 52 (!) 51  Resp: 18 18 18 19   Temp: 98 F (36.7 C)  99.5 F (37.5 C) 99.2 F (37.3 C) 97.8 F (36.6 C)  TempSrc: Oral Oral Oral Oral  SpO2: 96% 98% 98% 98%  Weight: 55.2 kg (121 lb 11.1 oz)     Height:        Intake/Output Summary (Last 24 hours) at 11/12/15 1508 Last data filed at 11/12/15 1424  Gross per 24 hour  Intake              356 ml  Output             2000 ml  Net            -1644 ml   Filed Weights   11/10/15 2037 11/11/15 1355 11/11/15 1720  Weight: 59.4  kg (130 lb 15.3 oz) 57.1 kg (125 lb 14.1 oz) 55.2 kg (121 lb 11.1 oz)    Examination:  General exam: Appears calm and comfortable  Respiratory system: Clear to auscultation. Respiratory effort normal. Cardiovascular system: S1 & S2 heard, RRR. No JVD, murmurs, rubs, gallops or clicks. No pedal edema. Gastrointestinal system: Abdomen is distended, soft and nontender. No organomegaly or masses felt. Normal bowel sounds heard. Central nervous system: Alert and oriented. No focal neurological deficits. Extremities: Symmetric 5 x 5 power. Right arm with clean incision.  Skin: No rashes, lesions or ulcers     Data Reviewed: I have personally reviewed following labs and imaging studies  CBC:  Recent Labs Lab 11/10/15 1604 11/11/15 0526 11/12/15 0811  WBC 4.9 4.5 4.2  NEUTROABS 2.4  --   --   HGB 10.4* 10.6* 10.2*  HCT 32.2* 33.0* 31.7*  MCV 104.5* 105.1* 104.6*  PLT 226 213 [id number]   Basic Metabolic Panel:  Recent Labs Lab 11/10/15 1604 11/11/15 0526 11/12/15 0811  NA 135 137 136  K 3.2* 3.4* 3.7  CL 97* 98* 99*  CO2 28 26 26   GLUCOSE 173* 142* 100*  BUN 21* 23* 12  CREATININE 4.18* 4.70* 3.32*  CALCIUM 10.7* 10.5* 10.1   GFR: Estimated Creatinine Clearance: 11.8 mL/min (by C-G formula based on SCr of 3.32 mg/dL (H)). Liver Function Tests:  Recent Labs Lab 11/10/15 1604  AST 25  ALT 13*  ALKPHOS 81  BILITOT 0.6  PROT 6.9  ALBUMIN 3.1*   No results for input(s): LIPASE, AMYLASE in the last 168 hours. No results for input(s): AMMONIA in the last 168 hours. Coagulation Profile:  Recent Labs Lab 11/10/15 1604  INR 1.21   Cardiac Enzymes: No results for input(s): CKTOTAL, CKMB, CKMBINDEX, TROPONINI in the last 168 hours. BNP (last 3 results) No results for input(s): PROBNP in the last 8760 hours. HbA1C: No results for input(s): HGBA1C in the last 72 hours. CBG:  Recent Labs Lab 11/10/15 1923 11/11/15 0641 11/11/15 1141 11/11/15 1857 11/12/15 1111    GLUCAP 164* 142* 149* 87 148*   Lipid Profile: No results for input(s): CHOL, HDL, LDLCALC, TRIG, CHOLHDL, LDLDIRECT in the last 72 hours. Thyroid Function Tests: No results for input(s): TSH, T4TOTAL, FREET4, T3FREE, THYROIDAB in the last 72 hours. Anemia Panel: No results for input(s): VITAMINB12, FOLATE, FERRITIN, TIBC, IRON, RETICCTPCT in the last 72 hours. Sepsis Labs:  Recent Labs Lab 11/10/15 1651  LATICACIDVEN 1.29    No results found for this or any previous visit (from the past 240 hour(s)).       Radiology Studies: No results found.      Scheduled Meds: . cinacalcet  60 mg Oral BID WC  . [START ON 11/14/2015] ferric  gluconate (FERRLECIT/NULECIT) IV  125 mg Intravenous Q T,Th,Sa-HD  . insulin aspart  0-15 Units Subcutaneous TID WC  . multivitamin  1 tablet Oral QHS  . pantoprazole  40 mg Oral Daily  . sorbitol  30 mL Oral Daily   Continuous Infusions: . sodium chloride 75 mL/hr at 11/10/15 2323  . heparin 1,050 Units/hr (11/11/15 0902)     LOS: 2 days    Time spent: 35 minutes.     Elmarie Shiley, MD [facility] Pager [phone]  If 7PM-7AM, please contact night-coverage [email or web address] Password [email or web address] 11/12/2015, 3:08 PM

## 2015-11-12 NOTE — Progress Notes (Signed)
ANTICOAGULATION CONSULT NOTE - F/U  Pharmacy Consult for heparin  Indication: Afib / DVT  - s/p embolectomy    Allergies  Allergen Reactions  . No Known Allergies Other (See Comments)   Patient Measurements: Height: 5' (152.4 cm) Weight: 121 lb 11.1 oz (55.2 kg) IBW/kg (Calculated) : 45.5 Heparin Dosing Weight: 59.4 kg   Vital Signs: Temp: 99.2 F (37.3 C) (10/01 0713) Temp Source: Oral (10/01 0713) BP: 142/33 (10/01 0713) Pulse Rate: 52 (10/01 0713)  Labs:  Recent Labs  11/10/15 1604 11/11/15 0526 11/11/15 0809 11/11/15 1814 11/12/15 0811  HGB 10.4* 10.6*  --   --  10.2*  HCT 32.2* 33.0*  --   --  31.7*  PLT 226 213  --   --  190  APTT 24  --   --   --   --   LABPROT 15.3*  --   --   --   --   INR 1.21  --   --   --   --   HEPARINUNFRC  --   --  0.19* 0.55 0.50  CREATININE 4.18* 4.70*  --   --  3.32*    Estimated Creatinine Clearance: 11.8 mL/min (by C-G formula based on SCr of 3.32 mg/dL (H)).   Medical History: Past Medical History:  Diagnosis Date  . Atrial fibrillation (HCC)    on ASA  . Bladder cancer [facility]) Jan 10, 2006   BLADDER NECK, BIOPSY: DIFFUSE LARGE B-CELL LYMPHOMA. CT showed worrisome pelvic LN. No treatment records in EPIC.  Marland Kitchen Blind 2005  . Chronic liver disease    non-alcoholic cirrhosis  . Constipation   . Diabetes mellitus   . ESRD on dialysis [facility])    - Dialysis- [place]  . History of blood transfusion   . Hyperparathyroidism, secondary ([place])   . Hypertension   . Non-Hodgkin lymphoma ([place]) 2007  . Pulmonary HTN   . 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 Now s/p thrombectomy and pharmacy to dose heparin starting last night at MIDNIGHT without bolus. Patient with ESRD on HD TTS and has history of Afib. Per notes, not on oral anticoagulation PTA due to HD and history cirrhosis.    Heparin level is therapeutic   Goal of  Therapy:  Heparin level 0.3-0.7 units/ml Monitor platelets by anticoagulation protocol: Yes   Plan:  Continue heparin at 1050 units / hr Follow up AM labs   Thank you Anette Guarneri, PharmD [phone] 11/12/2015, 11:44 AM

## 2015-11-13 ENCOUNTER — Telehealth: Payer: Self-pay | Admitting: Vascular Surgery

## 2015-11-13 ENCOUNTER — Inpatient Hospital Stay (HOSPITAL_COMMUNITY): Payer: Medicare Other

## 2015-11-13 DIAGNOSIS — I4891 Unspecified atrial fibrillation: Secondary | ICD-10-CM

## 2015-11-13 LAB — GLUCOSE, CAPILLARY
GLUCOSE-CAPILLARY: 99 mg/dL (ref 65–99)
GLUCOSE-CAPILLARY: 183 mg/dL — AB (ref 65–99)
GLUCOSE-CAPILLARY: 142 mg/dL — AB (ref 65–99)
Glucose-Capillary: 176 mg/dL — ABNORMAL HIGH (ref 65–99)
Glucose-Capillary: 160 mg/dL — ABNORMAL HIGH (ref 65–99)

## 2015-11-13 LAB — ECHOCARDIOGRAM COMPLETE
HEIGHTINCHES: 60 in
WEIGHTICAEL: 1947.1 [oz_av]

## 2015-11-13 LAB — CBC
HCT: 31.3 % — ABNORMAL LOW (ref 36.0–46.0)
Hemoglobin: 10 g/dL — ABNORMAL LOW (ref 12.0–15.0)
MCH: 33.1 pg (ref 26.0–34.0)
MCHC: 31.9 g/dL (ref 30.0–36.0)
MCV: 103.6 fL — AB (ref 78.0–100.0)
Platelets: 189 10*3/uL (ref 150–400)
RBC: 3.02 MIL/uL — ABNORMAL LOW (ref 3.87–5.11)
RDW: 14.1 % (ref 11.5–15.5)
WBC: 4.7 10*3/uL (ref 4.0–10.5)

## 2015-11-13 LAB — OCCULT BLOOD, POC DEVICE: FECAL OCCULT BLD: POSITIVE — AB

## 2015-11-13 LAB — HEPARIN LEVEL (UNFRACTIONATED): HEPARIN UNFRACTIONATED: 0.42 [IU]/mL (ref 0.30–0.70)

## 2015-11-13 MED ORDER — NEPRO/CARBSTEADY PO LIQD: 237.0000 mL | Freq: Three times a day (TID) | ORAL | 0 refills | Status: AC | PRN

## 2015-11-13 MED ORDER — ASPIRIN EC 81 MG PO TBEC: 81.0000 mg | DELAYED_RELEASE_TABLET | Freq: Every day | ORAL | 0 refills | Status: DC

## 2015-11-13 MED ORDER — LABETALOL HCL 100 MG PO TABS: 100.0000 mg | ORAL_TABLET | ORAL | Status: DC

## 2015-11-13 MED ORDER — OXYCODONE-ACETAMINOPHEN 5-325 MG PO TABS: 1.0000 | ORAL_TABLET | Freq: Four times a day (QID) | ORAL | 0 refills | Status: DC | PRN

## 2015-11-13 MED ORDER — LABETALOL HCL 100 MG PO TABS: 100.0000 mg | ORAL_TABLET | Freq: Every day | ORAL | Status: DC

## 2015-11-13 NOTE — Care Management Important Message (Signed)
Important Message  Patient Details  Name: [name] MRN: [id number] Date of Birth: [birth year]   Medicare Important Message Given:  Yes    Nathen May 11/13/2015, 12:44 PM

## 2015-11-13 NOTE — Progress Notes (Signed)
ANTICOAGULATION CONSULT NOTE - F/U  Pharmacy Consult for heparin  Indication: Afib / DVT  - s/p embolectomy    Assessment: 75 yo female presents with right arm pain suspected of having right arterial VTE. Now s/p thrombectomy and pharmacy to dose heparin. Patient with ESRD on HD TTS and has history of Afib but per notes, not on oral anticoagulation PTA due to HD and history cirrhosis.  Cha2ds2vasc of 5. Checking echo now, if thrombus is present plan is to start anticoagulation, if negative will have cardiology evaluate risk vs benefit.   Heparin level continues to be therapeutic this morning. CBC is stable.  Note patient is blind and speaks spanish.    Goal of Therapy:  Heparin level 0.3-0.7 units/ml Monitor platelets by anticoagulation protocol: Yes   Plan:  Continue heparin at 1050 units / hr Follow up AM labs and [facility] plan after echo today   Allergies  Allergen Reactions  . No Known Allergies Other (See Comments)   Patient Measurements: Height: 5' (152.4 cm) Weight: 121 lb 11.1 oz (55.2 kg) IBW/kg (Calculated) : 45.5 Heparin Dosing Weight: 59.4 kg   Vital Signs: Temp: 98 F (36.7 C) (10/02 0418) Temp Source: Oral (10/02 0418) BP: 181/28 (10/02 0418) Pulse Rate: 60 (10/02 0418)  Labs:  Recent Labs  11/10/15 1604 11/11/15 0526  11/11/15 1814 11/12/15 0811 11/12/15 1625 11/13/15 0336 11/13/15 0557  HGB 10.4* 10.6*  --   --  10.2* 10.0*  --  10.0*  HCT 32.2* 33.0*  --   --  31.7* 30.8*  --  31.3*  PLT 226 213  --   --  190  --   --  189  APTT 24  --   --   --   --   --   --   --   LABPROT 15.3*  --   --   --   --   --   --   --   INR 1.21  --   --   --   --   --   --   --   HEPARINUNFRC  --   --   < > 0.55 0.50  --  0.42  --   CREATININE 4.18* 4.70*  --   --  3.32*  --   --   --   < > = values in this interval not displayed.  Estimated Creatinine Clearance: 11.8 mL/min (by C-G formula based on SCr of 3.32 mg/dL (H)).   Medical History: Past Medical History:   Diagnosis Date  . Atrial fibrillation (HCC)    on ASA  . Bladder cancer [facility]) Jan 10, 2006   BLADDER NECK, BIOPSY: DIFFUSE LARGE B-CELL LYMPHOMA. CT showed worrisome pelvic LN. No treatment records in EPIC.  Marland Kitchen Blind 2005  . Chronic liver disease    non-alcoholic cirrhosis  . Constipation   . Diabetes mellitus   . ESRD on dialysis [facility])    - Dialysis- [place]  . History of blood transfusion   . Hyperparathyroidism, secondary ([place])   . Hypertension   . Non-Hodgkin lymphoma ([place]) 2007  . Pulmonary HTN   . Radiation    for bladder cancer    Thank you,  Erin Hearing PharmD., BCPS Clinical Pharmacist Pager [phone] 11/13/2015 10:32 AM

## 2015-11-13 NOTE — Progress Notes (Addendum)
[facility] [place]  Subjective  - Patient in A fib currently.  Comfortable this am, complaints of itching at incision site.  Staff and granddaughter interpreted.  She was very unhappy with HD yesterday and states their was no interpreter.     Objective (!) 181/28 60 98 F (36.7 C) (Oral) 19 98%  Intake/Output Summary (Last 24 hours) at 11/13/15 0738 Last data filed at 11/12/15 1424  Gross per 24 hour  Intake              356 ml  Output                0 ml  Net              356 ml    Left forearm mild edema at incisions no erythema Palpable radial pulse, grip 5/5, and sensation intact Heart A fib Lungs non labored breathing  Assessment/Planning: POD # 3 Right radial artery thrombectomy  IV heparin anticoagulation  Pending oral anticoagulation for discharge    Laurence Slate [facility] 11/13/2015 7:38 AM --  Laboratory Lab Results:  Recent Labs  11/12/15 0811 11/12/15 1625 11/13/15 0557  WBC 4.2  --  4.7  HGB 10.2* 10.0* 10.0*  HCT 31.7* 30.8* 31.3*  PLT 190  --  189   BMET  Recent Labs  11/11/15 0526 11/12/15 0811  NA 137 136  K 3.4* 3.7  CL 98* 99*  CO2 26 26  GLUCOSE 142* 100*  BUN 23* 12  CREATININE 4.70* 3.32*  CALCIUM 10.5* 10.1    COAG Lab Results  Component Value Date   INR 1.21 11/10/2015   INR >10.0 (HH) 12/01/2013   No results found for: PTT  Addendum  I have independently interviewed and examined the patient, and I agree with the physician assistant's findings.  Small hematoma at surgical incision: not surprising as one anticoag.  Echo done but not read.  If +intracardiac clot, anticoagulation recommended.  Otherwise, will be a judgment call given her multiple co-morbidities, fragile functional status, and history of prior falls.  - From vascular viewpoint, can be discharged once long-term anticoagulation question is settled.  Adele Barthel, MD, FACS [facility] [place] Office:  [phone] Pager: [phone]  11/13/2015, 10:50 AM

## 2015-11-13 NOTE — Progress Notes (Signed)
Patient discharged home with his family. Cruz, Location manager and NT, translated to the patient. The family speaks and understands [place]. The patient and family stated that they understood the importance of taking her aspirin daily (and other medications prescribed) IV was dc'd and was intact.

## 2015-11-13 NOTE — Progress Notes (Signed)
  Echocardiogram 2D Echocardiogram has been performed.  [name] 11/13/2015, 9:25 AM

## 2015-11-13 NOTE — Telephone Encounter (Signed)
-----   Message from Mena Goes, RN sent at 11/13/2015  9:39 AM EDT ----- Regarding: schedule   ----- Message ----- From: Conrad St. Hilaire, MD Sent: 11/10/2015   6:34 PM To: Vvs Charge Pool  KEENAN COVAN [id number] [birth year]  Procedure: R radial artery thrombectomy  Asst: Leontine Locket, PAC   Follow-up: 2 weeks

## 2015-11-13 NOTE — Telephone Encounter (Signed)
Spoke to family, will mail letter for appt on 12/01/15 for F/U

## 2015-11-13 NOTE — Progress Notes (Signed)
[facility] Progress Note   Subjective:  Non-English speaking patient, grand-daughter at bedside. Asking how her incision looks.   Objective Vitals:   11/12/15 0713 11/12/15 1420 11/12/15 2120 11/13/15 0418  BP: (!) 142/33 (!) 132/37 (!) 169/39 (!) 181/28  Pulse: (!) 52 (!) 51 89 60  Resp: 18 19 19 19   Temp: 99.2 F (37.3 C) 97.8 F (36.6 C) 98.2 F (36.8 C) 98 F (36.7 C)  TempSrc: Oral Oral Oral Oral  SpO2: 98% 98% 98% 98%  Weight:      Height:       Physical Exam General: Frail, NAD Heart: S1, S2, 2/6 holosystolic M. No JVD. Afib on monitor. Rate controlled.  Lungs: Bilateral breath sounds sl dec in bases No WOB. CTA Abdomen: Abdomen soft, active BS.  Extremities: No LE edema. RFA with incision mid forearm, area ecchymotic, moderate swelling, radial and ulnar pulses present distally.  Dialysis Access: LUA AVF + bruit  Dialysis Orders: Center: [facility] on TTS. EDW 54 kgHD Bath 2k, 2.25 caTime 3hrHeparin none. Access LUA AVFBFR 400 DFR AF1.5  Hec 31mcg IV/HD Mircera 82mcg q 2 wks last on 11/02/15 Other op lbas 2/21 hgb 10.4 tfs 20% . Corec ac 11.1 phos 4.5 Pth 299<323  Additional Objective Labs: Basic Metabolic Panel:  Recent Labs Lab 11/10/15 1604 11/11/15 0526 11/12/15 0811  NA 135 137 136  K 3.2* 3.4* 3.7  CL 97* 98* 99*  CO2 28 26 26   GLUCOSE 173* 142* 100*  BUN 21* 23* 12  CREATININE 4.18* 4.70* 3.32*  CALCIUM 10.7* 10.5* 10.1   Liver Function Tests:  Recent Labs Lab 11/10/15 1604  AST 25  ALT 13*  ALKPHOS 81  BILITOT 0.6  PROT 6.9  ALBUMIN 3.1*   CBC:  Recent Labs Lab 11/10/15 1604 11/11/15 0526 11/12/15 0811 11/12/15 1625 11/13/15 0557  WBC 4.9 4.5 4.2  --  4.7  NEUTROABS 2.4  --   --   --   --   HGB 10.4* 10.6* 10.2* 10.0* 10.0*  HCT 32.2* 33.0* 31.7* 30.8* 31.3*  MCV 104.5* 105.1* 104.6*  --  103.6*  PLT 226 213 190  --  189   CBG:  Recent Labs Lab 11/12/15 0751 11/12/15 1111  11/12/15 1628 11/12/15 2116 11/13/15 0611  GLUCAP 99 148* 145* 162* 160*   Studies/Results: No results found. Medications: . sodium chloride 75 mL/hr at 11/10/15 2323  . heparin 1,050 Units/hr (11/11/15 0902)   . cinacalcet  60 mg Oral BID WC  . [START ON 11/14/2015] ferric gluconate (FERRLECIT/NULECIT) IV  125 mg Intravenous Q T,Th,Sa-HD  . insulin aspart  0-15 Units Subcutaneous TID WC  . labetalol  100 mg Oral QHS  . [START ON 11/15/2015] labetalol  100 mg Oral Once per day on Sun Mon Wed Fri  . multivitamin  1 tablet Oral QHS  . pantoprazole  40 mg Oral Daily  . sorbitol  30 mL Oral Daily     Assessment/Plan: 1. Thromobembolism R Distal Radial  Artery: S/P thrombectomy of right radial artery per Dr. Bridgett Larsson 11/10/15. Currently on heparin drip.  2. ESRD -TTS AF. For HD tomorrow. Patient reports negative experience in HD unit. Says that they made her stay on HD longer than she usually does. Needs interpretor with her while on HD.  3. Anemia -HGB 10.0. Last ESA 11/02/15. Will give Aranesp 40 mcg IV tomorrow.  4. Secondary hyperparathyroidism - Ca 10.1 No binders on OP med list, cont sensipar, VDRA.  5.  HTN/volume - Hypertensive at present. Continue Labelolol 100 mg PO BID hold in AM on HD days.  Last HD 11/11/15 Pre Wt 57.1 kg Net UF 2000 Post wt 55.2 kg.  Attempt 2-2.5 liters 6. Nutrition - Albumin 3.1. Renal/Carb mod diet. Add nepro/renal bit 7. DM: per primary 8. H/O Bladder Ca (S/P radiation tx) 9. Non hodgkin lymphoma   Rita H. Brown NP-C 11/13/2015, 11:24 AM  [facility] [phone]  Pt seen, examined and agree w A/P as above. Stable. HD tomorrow.  Kelly Splinter MD [employer] pager [phone]   11/13/2015, 1:47 PM

## 2015-11-13 NOTE — Discharge Summary (Signed)
Physician Discharge Summary  [name] [id number] DOB: 1940-08-27 DOA: 11/10/2015  PCP: Valerie November, Jordan  Admit date: 11/10/2015 Discharge date: 11/13/2015  Admitted From: home  Disposition:  Home   Recommendations for Outpatient Follow-up:  1. Follow up with PCP in 1-2 weeks 2. Please obtain BMP/CBC in one week 3. Follow up with Dr [name] post Thrombectomy.     Discharge Condition: Stable.  CODE STATUS: Full code.  Diet recommendation: Heart Healthy   Brief/Interim Summary:  Brief Narrative: Valerie Gatson Ramirezis an 75 y.o.femalewith ESRD. Clot s/p thrombectomy. Presented to ER today with pain in arm that started this AM. Pain was in right arm and was constant in her palm area. Pain felt like cramps, numb, 5/10 pain. Nothing made the pain better or worse. Was seen at urgent care and they were unable to appreciate a radial pulse and so transferred the patient to the ER. [place] confirmed no obvious radial artery flow and the patient was taken for R radial artery thrombectomy by Dr. Bridgett Jordan. Now, s/p thrombectomy, thepatient has still has some pain in her hand, but better now than prior.  Known h/o afib, does not take anticoagulants other than ASA. CHA2DS2-VASc score is 5, indicating a 6.7%/year adjusted stroke rate.    Assessment & Plan:   Principal Problem:   Thromboembolism (HCC) Active Problems:   ESRD (end stage renal disease) on dialysis ([place])   Diabetes mellitus type II, non insulin dependent ([place])   Hypertension   Non-Hodgkin's lymphoma of extranodal site (HCC)   Cirrhosis, non-alcoholic (HCC)   Atrial fibrillation (HCC)   Cardiac murmur   Thromboembolism s/p thrombectomy of right radial artery -Patient with improved pain/numbness s/p thrombectomy -Heparin drip for now -plan to do ECHO, negative cardiac thrombus. Explain to family difficult situation with patient at high risk for anticoagulation due to Cirrhosis, frequent fall. Risk for severe bleeding or  risk for stroke or another embolic event  if they decline anticoagulation.  Patient wishes at this time aspirin, they understand the risk.  Ok to discharge today per vascular.   ESRD -Nephrology consult line notified of admission, as patient has Tu/Th/Sa HD Had HD Saturday.   Afib -Bradycardia, so rate controlled - this is likely related to Labetolol -Not previously on anticoagulation, possibly not even ASA (not listed on her medication list) -CHA2DS2-VASc score is 5, indicating a 6.7%/year adjusted stroke rate.  HTN -BP erratic - as low as 99/32 and as high as 159/29 -resume labetalol.   DM -On Glucotrol -Holding PO med, cover with SSI - A1c,8  Cirrhosis -NASH, no evidence of lymphoma in ascites fluid in 2/15 -Stable LFTs at this time  NHL -Patient followed by Dr. 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, s/p bladder radiation; PET scan in 3/15 showed no active disease    Discharge Diagnoses:  Principal Problem:   Thromboembolism (HCC) Active Problems:   ESRD (end stage renal disease) on dialysis ([place])   Diabetes mellitus type II, non insulin dependent ([place])   Hypertension   Non-Hodgkin's lymphoma of extranodal site ([place])   Cirrhosis, non-alcoholic ([place])   Atrial fibrillation ([place])   Cardiac murmur    Discharge Instructions  Discharge Instructions    Diet - low sodium heart healthy    Complete by:  As directed    Increase activity slowly    Complete by:  As directed        Medication List    STOP taking  these medications   diphenhydrAMINE-zinc acetate cream Commonly known as:  BENADRYL     TAKE these medications   acetaminophen 500 MG tablet Commonly known as:  TYLENOL Take 500 mg by mouth every 6 (six) hours as needed for headache (pain).   aspirin EC 81 MG tablet Take 1 tablet (81 mg total) by mouth daily.   cinacalcet 60 MG tablet Commonly known as:  SENSIPAR Take 60  mg by mouth 2 (two) times daily.   feeding supplement (NEPRO CARB STEADY) Liqd Take 237 mLs by mouth 3 (three) times daily as needed (Supplement).   glipiZIDE 5 MG tablet Commonly known as:  GLUCOTROL Take 5 mg by mouth 2 (two) times daily.   labetalol 100 MG tablet Commonly known as:  NORMODYNE Take 100 mg by mouth See admin instructions. Take 1 tablet (100 mg) by mouth Sunday, Monday, Wednesday, Friday morning (non-dialysis days), take 1 tablet (100 mg) daily at bedtime   multivitamin with minerals Tabs tablet Take 1 tablet by mouth daily.   omeprazole 20 MG capsule Commonly known as:  PRILOSEC Take 20 mg by mouth daily.   oxyCODONE-acetaminophen 5-325 MG tablet Commonly known as:  ROXICET Take 1 tablet by mouth every 6 (six) hours as needed for severe pain. What changed:  how much to take   sevelamer carbonate 800 MG tablet Commonly known as:  RENVELA Take 1,600 mg by mouth 3 (three) times daily before meals.   sorbitol 70 % solution take 30 milliliters by mouth once daily if needed for constipation      Follow-up Information    Valerie Barthel, Jordan Follow up in 1 week(s).   Specialties:  Vascular Surgery, Cardiology Contact information: [address] [place]  [id number] [phone]        CABEZA,[name] Follow up in 1 week(s).   Specialty:  Internal Medicine Contact information: [id number] [place] [address] [phone]          Allergies  Allergen Reactions  . No Known Allergies Other (See Comments)    Consultations:  Dr [name]  Nephrology    Procedures/Studies: ECHO negative for thrombus.    Subjective: Feeling well.   Discharge Exam: Vitals:   11/13/15 0418 11/13/15 1335  BP: (!) 181/28 (!) 141/37  Pulse: 60 (!) 52  Resp: 19 18  Temp: 98 F (36.7 C) 98.2 F (36.8 C)   Vitals:   11/12/15 1420 11/12/15 2120 11/13/15 0418 11/13/15 1335  BP: (!) 132/37 (!) 169/39 (!) 181/28 (!) 141/37  Pulse: (!) 51 89 60 (!)  52  Resp: 19 19 19 18   Temp: 97.8 F (36.6 C) 98.2 F (36.8 C) 98 F (36.7 C) 98.2 F (36.8 C)  TempSrc: Oral Oral Oral Oral  SpO2: 98% 98% 98% 97%  Weight:      Height:        General: Pt is alert, awake, not in acute distress Cardiovascular: RRR, S1/S2 +, no rubs, no gallops Respiratory: CTA bilaterally, no wheezing, no rhonchi Abdominal: Soft, NT, ND, bowel sounds + Extremities: no edema, no cyanosis Right arm with clean, healing incision, mild edema.     The results of significant diagnostics from this hospitalization (including imaging, microbiology, ancillary and laboratory) are listed below for reference.     Microbiology: No results found for this or any previous visit (from the past 240 hour(s)).   Labs: BNP (last 3 results) No results for input(s): BNP in the last 8760 hours. Basic Metabolic Panel:  Recent Labs Lab 11/10/15 1604 11/11/15 0526 11/12/15 0811  NA 135 137 136  K 3.2* 3.4* 3.7  CL 97* 98* 99*  CO2 28 26 26   GLUCOSE 173* 142* 100*  BUN 21* 23* 12  CREATININE 4.18* 4.70* 3.32*  CALCIUM 10.7* 10.5* 10.1   Liver Function Tests:  Recent Labs Lab 11/10/15 1604  AST 25  ALT 13*  ALKPHOS 81  BILITOT 0.6  PROT 6.9  ALBUMIN 3.1*   No results for input(s): LIPASE, AMYLASE in the last 168 hours. No results for input(s): AMMONIA in the last 168 hours. CBC:  Recent Labs Lab 11/10/15 1604 11/11/15 0526 11/12/15 0811 11/12/15 1625 11/13/15 0557  WBC 4.9 4.5 4.2  --  4.7  NEUTROABS 2.4  --   --   --   --   HGB 10.4* 10.6* 10.2* 10.0* 10.0*  HCT 32.2* 33.0* 31.7* 30.8* 31.3*  MCV 104.5* 105.1* 104.6*  --  103.6*  PLT 226 213 190  --  189   Cardiac Enzymes: No results for input(s): CKTOTAL, CKMB, CKMBINDEX, TROPONINI in the last 168 hours. BNP: Invalid input(s): POCBNP CBG:  Recent Labs Lab 11/12/15 1628 11/12/15 2116 11/13/15 0611 11/13/15 1101 11/13/15 1559  GLUCAP 145* 162* 160* 183* 142*   D-Dimer No results for  input(s): DDIMER in the last 72 hours. Hgb A1c  Recent Labs  11/11/15 0807  HGBA1C 8.0*   Lipid Profile No results for input(s): CHOL, HDL, LDLCALC, TRIG, CHOLHDL, LDLDIRECT in the last 72 hours. Thyroid function studies No results for input(s): TSH, T4TOTAL, T3FREE, THYROIDAB in the last 72 hours.  Invalid input(s): FREET3 Anemia work up No results for input(s): VITAMINB12, FOLATE, FERRITIN, TIBC, IRON, RETICCTPCT in the last 72 hours. Urinalysis No results found for: COLORURINE, APPEARANCEUR, LABSPEC, [place], GLUCOSEU, HGBUR, BILIRUBINUR, KETONESUR, PROTEINUR, UROBILINOGEN, NITRITE, LEUKOCYTESUR Sepsis Labs Invalid input(s): PROCALCITONIN,  WBC,  LACTICIDVEN Microbiology No results found for this or any previous visit (from the past 240 hour(s)).   Time coordinating discharge: Over 30 minutes  SIGNED:   Elmarie Shiley, Jordan  [facility] 11/13/2015, 4:52 PM Pager   If 7PM-7AM, please contact night-coverage [email or web address] Password [email or web address]

## 2015-11-14 ENCOUNTER — Telehealth: Payer: Self-pay | Admitting: Behavioral Health

## 2015-11-14 NOTE — Telephone Encounter (Signed)
Patient declined TCM/Hospital Follow-up, stating that she's "not sure of a day anyone is available to bring her to an appointment". Also, patient voiced that she does not have time to speak because she's leaving for dialysis, but will call the office if anything changes.

## 2015-11-15 ENCOUNTER — Ambulatory Visit: Payer: Self-pay | Admitting: Gastroenterology

## 2015-11-16 ENCOUNTER — Telehealth: Payer: Self-pay

## 2015-11-16 NOTE — Telephone Encounter (Signed)
Attempted to contact the pt. Via her [name] @ [phone]; message given to appt. Scheduler that the pt. Needed to be seen prior to f/u appt. 10/20, due to "poss. Infection @ surgical site."  Unable to contact grandson or the pt.  Left voice message on contact #'s listed.

## 2015-11-17 NOTE — Telephone Encounter (Signed)
2nd attempt to reach the patient or her grandson; left voice message at both the home phone and grandson's cell phone to call office and ask to speak to nurse.

## 2015-11-21 ENCOUNTER — Inpatient Hospital Stay (HOSPITAL_COMMUNITY)
Admission: EM | Admit: 2015-11-21 | Discharge: 2015-11-23 | DRG: 064 | Disposition: A | Discharge status: Home Health | Payer: Medicare Other | Attending: Internal Medicine | Admitting: Internal Medicine

## 2015-11-21 ENCOUNTER — Encounter (HOSPITAL_COMMUNITY): Payer: Self-pay

## 2015-11-21 ENCOUNTER — Emergency Department (HOSPITAL_COMMUNITY): Payer: Medicare Other

## 2015-11-21 DIAGNOSIS — E7800 Pure hypercholesterolemia, unspecified: Secondary | ICD-10-CM

## 2015-11-21 DIAGNOSIS — K5909 Other constipation: Secondary | ICD-10-CM | POA: Diagnosis present

## 2015-11-21 DIAGNOSIS — Z992 Dependence on renal dialysis: Secondary | ICD-10-CM | POA: Diagnosis not present

## 2015-11-21 DIAGNOSIS — I63411 Cerebral infarction due to embolism of right middle cerebral artery: Principal | ICD-10-CM | POA: Diagnosis present

## 2015-11-21 DIAGNOSIS — I610 Nontraumatic intracerebral hemorrhage in hemisphere, subcortical: Secondary | ICD-10-CM | POA: Diagnosis present

## 2015-11-21 DIAGNOSIS — Z9181 History of falling: Secondary | ICD-10-CM

## 2015-11-21 DIAGNOSIS — I638 Other cerebral infarction: Secondary | ICD-10-CM

## 2015-11-21 DIAGNOSIS — I639 Cerebral infarction, unspecified: Secondary | ICD-10-CM | POA: Diagnosis present

## 2015-11-21 DIAGNOSIS — N186 End stage renal disease: Secondary | ICD-10-CM | POA: Diagnosis present

## 2015-11-21 DIAGNOSIS — K7460 Unspecified cirrhosis of liver: Secondary | ICD-10-CM | POA: Diagnosis present

## 2015-11-21 DIAGNOSIS — R402142 Coma scale, eyes open, spontaneous, at arrival to emergency department: Secondary | ICD-10-CM | POA: Diagnosis present

## 2015-11-21 DIAGNOSIS — I5032 Chronic diastolic (congestive) heart failure: Secondary | ICD-10-CM | POA: Diagnosis present

## 2015-11-21 DIAGNOSIS — Z7982 Long term (current) use of aspirin: Secondary | ICD-10-CM

## 2015-11-21 DIAGNOSIS — N2581 Secondary hyperparathyroidism of renal origin: Secondary | ICD-10-CM | POA: Diagnosis present

## 2015-11-21 DIAGNOSIS — G8314 Monoplegia of lower limb affecting left nondominant side: Secondary | ICD-10-CM | POA: Diagnosis present

## 2015-11-21 DIAGNOSIS — G9349 Other encephalopathy: Secondary | ICD-10-CM | POA: Diagnosis present

## 2015-11-21 DIAGNOSIS — E119 Type 2 diabetes mellitus without complications: Secondary | ICD-10-CM | POA: Diagnosis not present

## 2015-11-21 DIAGNOSIS — Z86718 Personal history of other venous thrombosis and embolism: Secondary | ICD-10-CM

## 2015-11-21 DIAGNOSIS — Z7984 Long term (current) use of oral hypoglycemic drugs: Secondary | ICD-10-CM

## 2015-11-21 DIAGNOSIS — H547 Unspecified visual loss: Secondary | ICD-10-CM | POA: Diagnosis present

## 2015-11-21 DIAGNOSIS — Z8551 Personal history of malignant neoplasm of bladder: Secondary | ICD-10-CM

## 2015-11-21 DIAGNOSIS — I63432 Cerebral infarction due to embolism of left posterior cerebral artery: Secondary | ICD-10-CM | POA: Diagnosis present

## 2015-11-21 DIAGNOSIS — I708 Atherosclerosis of other arteries: Secondary | ICD-10-CM | POA: Diagnosis present

## 2015-11-21 DIAGNOSIS — R296 Repeated falls: Secondary | ICD-10-CM | POA: Diagnosis present

## 2015-11-21 DIAGNOSIS — G9340 Encephalopathy, unspecified: Secondary | ICD-10-CM | POA: Diagnosis not present

## 2015-11-21 DIAGNOSIS — E1165 Type 2 diabetes mellitus with hyperglycemia: Secondary | ICD-10-CM | POA: Diagnosis present

## 2015-11-21 DIAGNOSIS — I132 Hypertensive heart and chronic kidney disease with heart failure and with stage 5 chronic kidney disease, or end stage renal disease: Secondary | ICD-10-CM | POA: Diagnosis present

## 2015-11-21 DIAGNOSIS — M542 Cervicalgia: Secondary | ICD-10-CM

## 2015-11-21 DIAGNOSIS — E1122 Type 2 diabetes mellitus with diabetic chronic kidney disease: Secondary | ICD-10-CM | POA: Diagnosis present

## 2015-11-21 DIAGNOSIS — D638 Anemia in other chronic diseases classified elsewhere: Secondary | ICD-10-CM | POA: Diagnosis present

## 2015-11-21 DIAGNOSIS — R402242 Coma scale, best verbal response, confused conversation, at arrival to emergency department: Secondary | ICD-10-CM | POA: Diagnosis present

## 2015-11-21 DIAGNOSIS — I482 Chronic atrial fibrillation: Secondary | ICD-10-CM | POA: Diagnosis present

## 2015-11-21 DIAGNOSIS — I6340 Cerebral infarction due to embolism of unspecified cerebral artery: Secondary | ICD-10-CM | POA: Diagnosis not present

## 2015-11-21 DIAGNOSIS — R4182 Altered mental status, unspecified: Secondary | ICD-10-CM | POA: Diagnosis present

## 2015-11-21 DIAGNOSIS — I4891 Unspecified atrial fibrillation: Secondary | ICD-10-CM | POA: Diagnosis present

## 2015-11-21 DIAGNOSIS — E1159 Type 2 diabetes mellitus with other circulatory complications: Secondary | ICD-10-CM | POA: Diagnosis present

## 2015-11-21 DIAGNOSIS — I6523 Occlusion and stenosis of bilateral carotid arteries: Secondary | ICD-10-CM | POA: Diagnosis present

## 2015-11-21 DIAGNOSIS — I2510 Atherosclerotic heart disease of native coronary artery without angina pectoris: Secondary | ICD-10-CM | POA: Diagnosis present

## 2015-11-21 DIAGNOSIS — Z885 Allergy status to narcotic agent status: Secondary | ICD-10-CM

## 2015-11-21 DIAGNOSIS — R29700 NIHSS score 0: Secondary | ICD-10-CM | POA: Diagnosis present

## 2015-11-21 DIAGNOSIS — E876 Hypokalemia: Secondary | ICD-10-CM | POA: Diagnosis present

## 2015-11-21 DIAGNOSIS — Z8572 Personal history of non-Hodgkin lymphomas: Secondary | ICD-10-CM

## 2015-11-21 DIAGNOSIS — R402362 Coma scale, best motor response, obeys commands, at arrival to emergency department: Secondary | ICD-10-CM | POA: Diagnosis present

## 2015-11-21 DIAGNOSIS — Z5329 Procedure and treatment not carried out because of patient's decision for other reasons: Secondary | ICD-10-CM

## 2015-11-21 DIAGNOSIS — Z923 Personal history of irradiation: Secondary | ICD-10-CM

## 2015-11-21 DIAGNOSIS — I771 Stricture of artery: Secondary | ICD-10-CM

## 2015-11-21 LAB — CBC WITH DIFFERENTIAL/PLATELET
BASOS ABS: 0.1 10*3/uL (ref 0.0–0.1)
Basophils Relative: 1 %
EOS PCT: 3 %
Eosinophils Absolute: 0.1 10*3/uL (ref 0.0–0.7)
HEMATOCRIT: 33.8 % — AB (ref 36.0–46.0)
Hemoglobin: 11 g/dL — ABNORMAL LOW (ref 12.0–15.0)
LYMPHS ABS: 1.1 10*3/uL (ref 0.7–4.0)
LYMPHS PCT: 22 %
MCH: 33.4 pg (ref 26.0–34.0)
MCHC: 32.5 g/dL (ref 30.0–36.0)
MCV: 102.7 fL — AB (ref 78.0–100.0)
MONO ABS: 0.7 10*3/uL (ref 0.1–1.0)
MONOS PCT: 13 %
Neutro Abs: 3.2 10*3/uL (ref 1.7–7.7)
Neutrophils Relative %: 61 %
PLATELETS: 288 10*3/uL (ref 150–400)
RBC: 3.29 MIL/uL — ABNORMAL LOW (ref 3.87–5.11)
RDW: 15.3 % (ref 11.5–15.5)
WBC: 5.2 10*3/uL (ref 4.0–10.5)

## 2015-11-21 LAB — COMPREHENSIVE METABOLIC PANEL
ALT: 13 U/L — ABNORMAL LOW (ref 14–54)
ANION GAP: 13 (ref 5–15)
AST: 30 U/L (ref 15–41)
Albumin: 3.1 g/dL — ABNORMAL LOW (ref 3.5–5.0)
Alkaline Phosphatase: 76 U/L (ref 38–126)
BUN: 15 mg/dL (ref 6–20)
CHLORIDE: 96 mmol/L — AB (ref 101–111)
CO2: 27 mmol/L (ref 22–32)
Calcium: 9.3 mg/dL (ref 8.9–10.3)
Creatinine, Ser: 3.29 mg/dL — ABNORMAL HIGH (ref 0.44–1.00)
GFR calc non Af Amer: 13 mL/min — ABNORMAL LOW (ref >60)
GFR, EST AFRICAN AMERICAN: 15 mL/min — AB (ref >60)
GLUCOSE: 197 mg/dL — AB (ref 65–99)
Potassium: 3.1 mmol/L — ABNORMAL LOW (ref 3.5–5.1)
Sodium: 136 mmol/L (ref 135–145)
Total Bilirubin: 0.9 mg/dL (ref 0.3–1.2)
Total Protein: 7 g/dL (ref 6.5–8.1)

## 2015-11-21 LAB — I-STAT CHEM 8, ED
BUN: 19 mg/dL (ref 6–20)
CALCIUM ION: 1.08 mmol/L — AB (ref 1.15–1.40)
Chloride: 94 mmol/L — ABNORMAL LOW (ref 101–111)
Creatinine, Ser: 3.2 mg/dL — ABNORMAL HIGH (ref 0.44–1.00)
GLUCOSE: 197 mg/dL — AB (ref 65–99)
HCT: 36 % (ref 36.0–46.0)
Hemoglobin: 12.2 g/dL (ref 12.0–15.0)
Potassium: 3.3 mmol/L — ABNORMAL LOW (ref 3.5–5.1)
SODIUM: 135 mmol/L (ref 135–145)
TCO2: 30 mmol/L (ref 0–100)

## 2015-11-21 LAB — I-STAT TROPONIN, ED: Troponin i, poc: 0.05 ng/mL (ref 0.00–0.08)

## 2015-11-21 LAB — PROTIME-INR
INR: 1.29
Prothrombin Time: 16.2 seconds — ABNORMAL HIGH (ref 11.4–15.2)

## 2015-11-21 MED ORDER — NEPRO/CARBSTEADY PO LIQD: 237.0000 mL | Freq: Three times a day (TID) | ORAL | Status: DC | PRN

## 2015-11-21 MED ORDER — ADULT MULTIVITAMIN W/MINERALS CH
1.0000 | ORAL_TABLET | Freq: Every day | ORAL | Status: DC
  Administered 2015-11-22 – 2015-11-23 (×2): 1 via ORAL
  Filled 2015-11-21 (×2): qty 1

## 2015-11-21 MED ORDER — STROKE: EARLY STAGES OF RECOVERY BOOK
Freq: Once | Status: DC
  Filled 2015-11-21: qty 1

## 2015-11-21 MED ORDER — PANTOPRAZOLE SODIUM 40 MG PO TBEC
40.0000 mg | DELAYED_RELEASE_TABLET | Freq: Every day | ORAL | Status: DC
  Filled 2015-11-21: qty 1

## 2015-11-21 MED ORDER — INSULIN ASPART 100 UNIT/ML ~~LOC~~ SOLN
0.0000 [IU] | Freq: Three times a day (TID) | SUBCUTANEOUS | Status: DC
  Administered 2015-11-22: 2 [IU] via SUBCUTANEOUS

## 2015-11-21 MED ORDER — CINACALCET HCL 30 MG PO TABS
60.0000 mg | ORAL_TABLET | Freq: Two times a day (BID) | ORAL | Status: DC
  Administered 2015-11-22: 60 mg via ORAL
  Filled 2015-11-21 (×3): qty 2

## 2015-11-21 MED ORDER — ASPIRIN 300 MG RE SUPP
300.0000 mg | Freq: Every day | RECTAL | Status: DC
  Filled 2015-11-21 (×2): qty 1

## 2015-11-21 MED ORDER — HEPARIN SODIUM (PORCINE) 5000 UNIT/ML IJ SOLN
5000.0000 [IU] | Freq: Three times a day (TID) | INTRAMUSCULAR | Status: DC
  Administered 2015-11-21 – 2015-11-22 (×2): 5000 [IU] via SUBCUTANEOUS
  Filled 2015-11-21 (×3): qty 1

## 2015-11-21 MED ORDER — SORBITOL 70 % PO SOLN
30.0000 mL | Freq: Every day | ORAL | Status: DC | PRN
  Filled 2015-11-21 (×2): qty 30

## 2015-11-21 MED ORDER — STROKE: EARLY STAGES OF RECOVERY BOOK
Freq: Once | Status: DC
  Filled 2015-11-21 (×3): qty 1

## 2015-11-21 MED ORDER — SODIUM CHLORIDE 0.9 % IV SOLN: INTRAVENOUS | Status: DC

## 2015-11-21 MED ORDER — ASPIRIN 325 MG PO TABS
325.0000 mg | ORAL_TABLET | Freq: Every day | ORAL | Status: DC
  Administered 2015-11-22 – 2015-11-23 (×2): 325 mg via ORAL
  Filled 2015-11-21 (×2): qty 1

## 2015-11-21 MED ORDER — ACETAMINOPHEN 500 MG PO TABS
500.0000 mg | ORAL_TABLET | Freq: Four times a day (QID) | ORAL | Status: DC | PRN
  Administered 2015-11-22 (×2): 500 mg via ORAL
  Filled 2015-11-21 (×2): qty 1

## 2015-11-21 MED ORDER — SEVELAMER CARBONATE 800 MG PO TABS
1600.0000 mg | ORAL_TABLET | Freq: Three times a day (TID) | ORAL | Status: DC
  Administered 2015-11-22: 1600 mg via ORAL
  Filled 2015-11-21: qty 2

## 2015-11-21 NOTE — Telephone Encounter (Signed)
3rd attempt to contact pt's grandson @ [phone].  Left a voice message again to contact office to discuss pt's symptoms.

## 2015-11-21 NOTE — ED Notes (Signed)
Pt presented via EMS for AMS, hx dialysis and had treatment this AM but did not finish. CT scan showed infarct, pt has went for MRI. Passed swallow screen, NIHSS 0. Pt had blood clot removed in R arm last week and restricted in L arm d/t vascular acess. BP cuff is placed on RLE b/c family is concerned about cuff hurting arm. Pt only speaks spanish, family at bedside speaks some english. 22 L arm.

## 2015-11-21 NOTE — ED Notes (Signed)
Pt back from MRI 

## 2015-11-21 NOTE — H&P (Signed)
History and Physical    PAHOLA KIENHOLZ [id number] DOB: November 26, 1940 DOA: 11/21/2015  Referring MD/NP/PA: Dr. Rogene Houston PCP: Kathlene November, MD  Patient coming from: Dialysis  Chief Complaint: Altered mental status  HPI: NACONA DEEL is a 75 y.o.spanish speaking only female  with medical history significant of ESRD on HD(T/TH/Sat), HTN, HLD, DM, A.fib on ASA; who presents with complaints of altered mental status while receiving hemodialysis today. Patient's family is present in the room and her grandson acts as a Astronomer. He states that upon their arrival she was initially confused and did not recall who family members for, but after being in the emergency room for some time was then able to recognize family members. Associated symptoms include generalized weakness and complaints of neck discomfort from laying in the hospital gurney. Recently hospitalized from 9/29-[place]/2 [place] where she underwent a right radial artery embolectomy by Dr Bridgett Larsson. It was discussed with her the need to be placed on anticoagulation at that time, but family notes there was concerned with her history of liver cirrhosis.  ED Course: Upon admission into the emergency department patient was evaluated and seen to be afebrile with pulse 46-55, blood pressure as high as 173/55, all other vitals within normal limits. Lab work revealed WBC 5.2, hemoglobin 11, platelets 288, sodium 136, potassium 3.1, chloride 96, CO2 27, BUN 15, creatinine 3.29, calcium 9.3, INR 1.29, glucose 197. Imaging studies of the brain reveal Chest x-ray showing mild volume overload.  Review of Systems: As per HPI otherwise 10 point review of systems negative.   Past Medical History:  Diagnosis Date  . Atrial fibrillation (HCC)    on ASA  . Bladder cancer [facility]) Jan 10, 2006   BLADDER NECK, BIOPSY: DIFFUSE LARGE B-CELL LYMPHOMA. CT showed worrisome pelvic LN. No treatment records in EPIC.  Marland Kitchen Blind 2005  . Chronic liver disease    non-alcoholic  cirrhosis  . Constipation   . Diabetes mellitus   . ESRD on dialysis [facility])    - Dialysis- [place]  . History of blood transfusion   . Hyperparathyroidism, secondary ([place])   . Hypertension   . Non-Hodgkin lymphoma ([place]) 2007  . Pulmonary HTN   . Radiation    for bladder cancer    Past Surgical History:  Procedure Laterality Date  . AV FISTULA PLACEMENT Right   . AV FISTULA PLACEMENT Left   . COLONOSCOPY    . FINGER SURGERY Left    Skin graft - skin graft  . INSERTION OF DIALYSIS CATHETER    . 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 THROMBECTOMY BRACHIAL ARTERY Right 11/10/2015   Procedure: RIGHT THROMBECTOMY BRACHIAL ARTERY;  Surgeon: Conrad Franklin, MD;  Location: [facility];  Service: Vascular;  Laterality: Right;     reports that she has never smoked. She has never used smokeless tobacco. She reports that she does not drink alcohol or use drugs.  Allergies  Allergen Reactions  . Roxicet [Oxycodone-Acetaminophen] Other (See Comments)    Hostile, confusion    Family History  Problem Relation Age of Onset  . Thyroid disease Mother   . Lung cancer Father   . Colon cancer Neg Hx   . Breast cancer Neg Hx     Prior to Admission medications   Medication Sig Start Date End Date Taking?  Authorizing Provider  acetaminophen (TYLENOL) 500 MG tablet Take 500 mg by mouth every 6 (six) hours as needed for headache (pain).    Yes Historical Provider, MD  aspirin EC 81 MG tablet Take 1 tablet (81 mg total) by mouth daily. 11/13/15  Yes Belkys A Regalado, MD  cinacalcet (SENSIPAR) 60 MG tablet Take 60 mg by mouth 2 (two) times daily.   Yes Historical Provider, MD  glipiZIDE (GLUCOTROL) 5 MG tablet Take 5 mg by mouth 2 (two) times daily. 09/18/15  Yes Historical Provider, MD  labetalol  (NORMODYNE) 100 MG tablet Take 100 mg by mouth See admin instructions. Take 1 tablet (100 mg) by mouth Sunday, Monday, Wednesday, Friday morning (non-dialysis days), take 1 tablet (100 mg) daily at bedtime   Yes Historical Provider, MD  Multiple Vitamin (MULTIVITAMIN WITH MINERALS) TABS tablet Take 1 tablet by mouth daily.   Yes Historical Provider, MD  Nutritional Supplements (FEEDING SUPPLEMENT, NEPRO CARB STEADY,) LIQD Take 237 mLs by mouth 3 (three) times daily as needed (Supplement). 11/13/15  Yes Belkys A Regalado, MD  omeprazole (PRILOSEC) 20 MG capsule Take 20 mg by mouth daily.  04/08/13  Yes Historical Provider, MD  oxyCODONE-acetaminophen (ROXICET) 5-325 MG tablet Take 1 tablet by mouth every 6 (six) hours as needed for severe pain. 11/13/15  Yes Belkys A Regalado, MD  sevelamer carbonate (RENVELA) 800 MG tablet Take 1,600 mg by mouth 3 (three) times daily before meals.   Yes Historical Provider, MD  sorbitol 70 % solution take 30 milliliters by mouth once daily if needed for constipation 01/12/14  Yes Midge Minium, MD    Physical Exam:  Constitutional: Elderly female who is chronically ill-appearing NAD and appearsncomfortable Vitals:   11/21/15 1700 11/21/15 1715 11/21/15 1900 11/21/15 2057  BP: (!) 150/48 (!) 153/51 (!) 147/53 (!) 145/49  Pulse: (!) 46 (!) 51 (!) 53 (!) 55  Resp: 13 13 17 15   Temp:      SpO2: 100% 99% 96% 99%   Eyes: PERRL, lids and conjunctivae normal ENMT: Mucous membranes are moist. Posterior pharynx clear of any exudate or lesions. Poor dentition.  Neck: normal, supple, no masses, no thyromegaly Respiratory: Positive crackles heard throughout both lung fields Normal respiratory effort. No accessory muscle use.  Cardiovascular: Irregular irregular with +SEM / rubs / gallops. No extremity edema. 2+ pedal pulses. No carotid bruits.  Left AV fistula with palpable thrill. Abdomen: no tenderness, no masses palpated. No hepatosplenomegaly. Bowel sounds positive.   Musculoskeletal: no clubbing / cyanosis. No joint deformity upper and lower extremities. Good ROM, no contractures. Decreased overall muscle tone.  Skin: no rashes, lesions, ulcers. No induration Neurologic: CN 2-12 grossly intact. Sensation intact, DTR normal. Strength 5/5 in all 4.  Psychiatric: Normal judgment and insight. Alert and oriented x 3. Normal mood.     Labs on Admission: I have personally reviewed following labs and imaging studies  CBC:  Recent Labs Lab 11/21/15 1825 11/21/15 1847  WBC 5.2  --   NEUTROABS 3.2  --   HGB 11.0* 12.2  HCT 33.8* 36.0  MCV 102.7*  --   PLT 288  --    Basic Metabolic Panel:  Recent Labs Lab 11/21/15 1825 11/21/15 1847  NA 136 135  K 3.1* 3.3*  CL 96* 94*  CO2 27  --   GLUCOSE 197* 197*  BUN 15 19  CREATININE 3.29* 3.20*  CALCIUM 9.3  --    GFR: Estimated Creatinine Clearance: 12.2 mL/min (by C-G formula  based on SCr of 3.2 mg/dL (H)). Liver Function Tests:  Recent Labs Lab 11/21/15 1825  AST 30  ALT 13*  ALKPHOS 76  BILITOT 0.9  PROT 7.0  ALBUMIN 3.1*   No results for input(s): LIPASE, AMYLASE in the last 168 hours. No results for input(s): AMMONIA in the last 168 hours. Coagulation Profile:  Recent Labs Lab 11/21/15 1825  INR 1.29   Cardiac Enzymes: No results for input(s): CKTOTAL, CKMB, CKMBINDEX, TROPONINI in the last 168 hours. BNP (last 3 results) No results for input(s): PROBNP in the last 8760 hours. HbA1C: No results for input(s): HGBA1C in the last 72 hours. CBG: No results for input(s): GLUCAP in the last 168 hours. Lipid Profile: No results for input(s): CHOL, HDL, LDLCALC, TRIG, CHOLHDL, LDLDIRECT in the last 72 hours. Thyroid Function Tests: No results for input(s): TSH, T4TOTAL, FREET4, T3FREE, THYROIDAB in the last 72 hours. Anemia Panel: No results for input(s): VITAMINB12, FOLATE, FERRITIN, TIBC, IRON, RETICCTPCT in the last 72 hours. Urine analysis: No results found for:  COLORURINE, APPEARANCEUR, LABSPEC, PHURINE, GLUCOSEU, HGBUR, BILIRUBINUR, KETONESUR, PROTEINUR, UROBILINOGEN, NITRITE, LEUKOCYTESUR Sepsis Labs: No results found for this or any previous visit (from the past 240 hour(s)).   Radiological Exams on Admission: Dg Chest 2 View  Result Date: 11/21/2015 CLINICAL DATA:  Altered mental status EXAM: CHEST  2 VIEW COMPARISON:  04/08/2012 FINDINGS: The heart is severely enlarged. Lungs are under aerated. Vascular congestion is present. Linear opacities at the lung bases are worrisome for interstitial edema. Small left pleural effusion. IMPRESSION: Findings above are worrisome for mild volume overload. Electronically Signed   By: Marybelle Killings M.D.   On: 11/21/2015 17:50   Ct Head Wo Contrast  Result Date: 11/21/2015 CLINICAL DATA:  Altered mental status.  Atrial fibrillation EXAM: CT HEAD WITHOUT CONTRAST TECHNIQUE: Contiguous axial images were obtained from the base of the skull through the vertex without intravenous contrast. COMPARISON:  Head CT 05/05/2011 FINDINGS: Brain: There is a large region of hypoattenuation with blurring of gray-white differentiation in the posterior left temporal lobe. Additionally, there is an area of hypoattenuation in the mid right temporal lobe with associated loss of gray-white differentiation. Small areas of hyperdensity within the left temporal lobe may indicate petechial hemorrhage. However, there is no intraparenchymal hematoma. No midline shift or mass effect. There is periventricular hypoattenuation compatible with chronic microvascular disease. No extra-axial collection. Vascular: There is calcification within the basilar artery, intracranial internal carotid arteries and right middle cerebral artery. Skull: Normal visualized skull base, calvarium and extracranial soft tissues. Sinuses/Orbits: No sinus fluid levels or advanced mucosal thickening. No mastoid effusion. Normal orbits. IMPRESSION: 1. Acute to early subacute  infarcts within the left greater than right temporal lobes. 2. Suspected small amount of petechial hemorrhage within the left temporal ischemic region. No intraparenchymal hematoma. 3. No midline shift or mass effect. Critical Value/emergent results were called by telephone at the time of interpretation on 11/21/2015 at 8:04 pm to Dr. Fredia Sorrow , who verbally acknowledged these results. Electronically Signed   By: Ulyses Jarred M.D.   On: 11/21/2015 20:04   Mr Brain Wo Contrast (neuro Protocol)  Result Date: 11/21/2015 CLINICAL DATA:  Stroke.  Dialysis patient. EXAM: MRI HEAD WITHOUT CONTRAST TECHNIQUE: Multiplanar, multiecho pulse sequences of the brain and surrounding structures were obtained without intravenous contrast. COMPARISON:  CT head 11/21/2015 FINDINGS: Patient was not able to complete the study. The patient began moving after the diffusion sequences and did not complete further sequences after  axial FLAIR imaging. Acute infarct in the left medial temporal lobe in the left PCA territory. This corresponds to the low-density area on CT. Gradient echo imaging not performed on MRI but no definite hemorrhage on the diffusion-weighted imaging. Acute infarct in the right posterior lateral temporal lobe and high right parietal lobe in the right MCA territory. Generalized atrophy.  Negative for hydrocephalus. IMPRESSION: Acute infarct left medial temporal lobe in the left PCA territory. Acute infarct in the right temporoparietal lobe in the right MCA territory. Findings suggest emboli. Incomplete study due to lack of patient cooperation. Electronically Signed   By: Franchot Gallo M.D.   On: 11/21/2015 21:53      Assessment/Plan CVA (cerebral vascular accident) [facility]):  - Admit to a telemetry bed - PT/OT/speech eval and treat  - Continue aspirin - Allow for permissive hypertension - Appreciate neurology consultation, follow-up further recommendations  Acute encephalopathy - Continue to  monitor - Limiting sedating medications - Held oxycodone  Diastolic heart failure: Patient's last EF showed ejection fraction of 55-60% with grade 3 diastolic dysfunction. Patient was noted to have significant bilateral atrial dilation.  - Continue to monitor strict ins and outs  Neck pain - Tylenol prn  Atrial fibrillation: Chronic. Patient currently rate controlled. CHA2DS2-VASc score = 5. Family noted concern with patient's history of liver disease as 1 of the reasons for not starting the patient on blood thinners. - Continue aspirin for now - rediscussed with family risks and benefits of anticoagulation  ESRD on HD(T/TH/Sat) - Left message on HD voicemail regarding patient being admitted into the hospital - Continue Sensipar and Renvela  Hypokalemia potassium initially 3.1 on admission. - Give 40 mEq of potassium chloride 1 dose now - Continue to monitor and replace as needed   Diabetes mellitus type 2 - Hypoglycemic protocols - Held glipizide  - CBGs every before meals with sensitive sliding scale insulin  Anemia of chronic disease - Recheck CBC in a.m.  Essential hypertension  - hold Labetalol  DVT prophylaxis: heparin Code Status:Full  Family Communication:   Discussed overall care with the patient and family present at bedside. Disposition Plan: To be determined Consults called: Neurology Admission status: Inpatient estimated length of stay will then 2 midnights   Norval Morton MD [facility] Pager [phone]  If 7PM-7AM, please contact night-coverage [email or web address] Password [email or web address]  11/21/2015, 10:25 PM

## 2015-11-21 NOTE — Consult Note (Signed)
Neurology Consult Note  Reason for Consultation: Altered mental status during dialysis, abnormal head CT  Requesting provider: Fredia Sorrow, MD  CC: Left-sided headache and neck pain  HPI: This is a 75 year old right-handed woman who is Spanish-speaking only. History is obtained with the assistance of family members at the bedside who speaks excellent [place].  Her family reports that she was in her usual state of health until this morning. She took some pain medication and her diabetes medication and about 20-30 minutes later seemed to be confused. She was scheduled for hemodialysis today and went to her appointment as usual. However, about 2-3 hours into treatment, she was noted to be markedly confused. This prompted a trip to the emergency department for further evaluation. CT scan of the head revealed hypodensities in both temporal lobes, concerning for stroke. Her family reports that she is much improved with regards to her mental status at this time. They have not appreciated any focal weakness or other deficits beyond her mental status change.  She was recently admitted to [facility] from 9/29-10/2/17 after presenting to the emergency room with right arm pain. Workup revealed thrombosis of the right radial artery and she underwent thrombectomy on 11/10/15. She was maintained on a heparin drip during her hospitalization. She was taking aspirin at the time of admission with a known history of atrial fibrillation. A transthoracic echocardiogram on 11/13/15 showed severe dilation of both atria without PFO or ASD, no mention of intracardiac thrombus. According to the hospitalist's discharge summary, they discussed with the family that the patient is high risk for bleeding with anti-coagulation because of her underlying cirrhosis and history of frequent falls but without anti-coagulation she was at high risk of additional embolic events, including stroke. Following this discussion, the patient and her  family decided to continue aspirin and deferred anticoagulation.   PMH:  Past Medical History:  Diagnosis Date  . Atrial fibrillation (HCC)    on ASA  . Bladder cancer [facility]) Jan 10, 2006   BLADDER NECK, BIOPSY: DIFFUSE LARGE B-CELL LYMPHOMA. CT showed worrisome pelvic LN. No treatment records in EPIC.  Marland Kitchen Blind 2005  . Chronic liver disease    non-alcoholic cirrhosis  . Constipation   . Diabetes mellitus   . ESRD on dialysis [facility])    - Dialysis- [place]  . History of blood transfusion   . Hyperparathyroidism, secondary ([place])   . Hypertension   . Non-Hodgkin lymphoma ([place]) 2007  . Pulmonary HTN   . Radiation    for bladder cancer    PSH:  Past Surgical History:  Procedure Laterality Date  . AV FISTULA PLACEMENT Right   . AV FISTULA PLACEMENT Left   . COLONOSCOPY    . FINGER SURGERY Left    Skin graft - skin graft  . INSERTION OF DIALYSIS CATHETER    . 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 THROMBECTOMY BRACHIAL ARTERY Right 11/10/2015   Procedure: RIGHT THROMBECTOMY BRACHIAL ARTERY;  Surgeon: Conrad Selma, MD;  Location: [facility] OR;  Service: Vascular;  Laterality: Right;    Family history: Family History  Problem Relation Age of Onset  . Thyroid disease Mother   . Lung cancer Father   . Colon cancer Neg Hx   . Breast cancer Neg Hx     Social history:  Social  History   Social History  . Marital status: Married    Spouse name: N/A  . Number of children: 8  . Years of education: N/A   Occupational History  . Disabled Unemployed   Social History Main Topics  . Smoking status: Never Smoker  . Smokeless tobacco: Never Used  . Alcohol use No  . Drug use: No  . Sexual activity: Not on file   Other Topics Concern  . Not on file   Social History Narrative    Original from [place]    In [place] since ~ 2006   household -- husband       Communicate with daughter-in-law Murray Hodgkins [phone]    Current outpatient meds: Current Meds  Medication Sig  . acetaminophen (TYLENOL) 500 MG tablet Take 500 mg by mouth every 6 (six) hours as needed for headache (pain).   Marland Kitchen aspirin EC 81 MG tablet Take 1 tablet (81 mg total) by mouth daily.  . cinacalcet (SENSIPAR) 60 MG tablet Take 60 mg by mouth 2 (two) times daily.  Marland Kitchen glipiZIDE (GLUCOTROL) 5 MG tablet Take 5 mg by mouth 2 (two) times daily.  Marland Kitchen labetalol (NORMODYNE) 100 MG tablet Take 100 mg by mouth See admin instructions. Take 1 tablet (100 mg) by mouth Sunday, Monday, Wednesday, Friday morning (non-dialysis days), take 1 tablet (100 mg) daily at bedtime  . Multiple Vitamin (MULTIVITAMIN WITH MINERALS) TABS tablet Take 1 tablet by mouth daily.  . Nutritional Supplements (FEEDING SUPPLEMENT, NEPRO CARB STEADY,) LIQD Take 237 mLs by mouth 3 (three) times daily as needed (Supplement).  Marland Kitchen omeprazole (PRILOSEC) 20 MG capsule Take 20 mg by mouth daily.   Marland Kitchen oxyCODONE-acetaminophen (ROXICET) 5-325 MG tablet Take 1 tablet by mouth every 6 (six) hours as needed for severe pain.  . sevelamer carbonate (RENVELA) 800 MG tablet Take 1,600 mg by mouth 3 (three) times daily before meals.  . sorbitol 70 % solution take 30 milliliters by mouth once daily if needed for constipation    Current inpatient meds:  Current Facility-Administered Medications  Medication Dose Route Frequency Provider Last Rate Last Dose  . [START ON 11/22/2015] 0.9 %  sodium chloride infusion   Intravenous Continuous Fredia Sorrow, MD       Current Outpatient Prescriptions  Medication Sig Dispense Refill  . acetaminophen (TYLENOL) 500 MG tablet Take 500 mg by mouth every 6 (six) hours as needed for headache (pain).     Marland Kitchen aspirin EC 81 MG tablet Take 1 tablet (81 mg total) by mouth daily. 30 tablet 0  . cinacalcet (SENSIPAR) 60 MG tablet Take 60 mg  by mouth 2 (two) times daily.    Marland Kitchen glipiZIDE (GLUCOTROL) 5 MG tablet Take 5 mg by mouth 2 (two) times daily.  0  . labetalol (NORMODYNE) 100 MG tablet Take 100 mg by mouth See admin instructions. Take 1 tablet (100 mg) by mouth Sunday, Monday, Wednesday, Friday morning (non-dialysis days), take 1 tablet (100 mg) daily at bedtime    . Multiple Vitamin (MULTIVITAMIN WITH MINERALS) TABS tablet Take 1 tablet by mouth daily.    . Nutritional Supplements (FEEDING SUPPLEMENT, NEPRO CARB STEADY,) LIQD Take 237 mLs by mouth 3 (three) times daily as needed (Supplement). 30 Can 0  . omeprazole (PRILOSEC) 20 MG capsule Take 20 mg by mouth daily.     Marland Kitchen oxyCODONE-acetaminophen (ROXICET) 5-325 MG tablet Take 1 tablet by mouth every 6 (six) hours as needed for severe pain. 10 tablet 0  . sevelamer carbonate (RENVELA)  800 MG tablet Take 1,600 mg by mouth 3 (three) times daily before meals.    . sorbitol 70 % solution take 30 milliliters by mouth once daily if needed for constipation 300 mL 0    Allergies: Allergies  Allergen Reactions  . Roxicet [Oxycodone-Acetaminophen] Other (See Comments)    Hostile, confusion    ROS: As per HPI. A full 14-point review of systems was performed and is otherwise notable for mild nausea. Her family reports that she is blind, able to see shadows only. The remainder is unremarkable.  PE:  BP (!) 145/49   Pulse (!) 55   Temp 98.7 F (37.1 C)   Resp 15   SpO2 99%   General: Chronically ill-appearing woman lying on ED gurney. She appears uncomfortable, complaining of pain in the left side of her neck and left head. She is alert. Speech clear, no dysarthria. No evident aphasia per family. Follows commands briskly.  HEENT: Normocephalic. Neck supple without LAD. She has some tenderness to palpation along the upper third of the left trapezius muscle and in the left cervical paraspinal muscles. She reports improvement in this discomfort with gentle massage. MM slightly dry, OP  clear. Sclerae anicteric. No conjunctival injection.  CV: Irregular with 2/6 systolic murmur heard over the precordium, loudest at left sternal border. Carotid pulses full and symmetric, no bruits. Distal pulses 2+ and symmetric.  Lungs: CTAB on anterior exam.  Abdomen: Soft, non-distended, non-tender. Bowel sounds present x4.  Extremities: No C/C/E. she has a small incision on the right forearm from recent thrombectomy. A fistula is present in the left upper extremity. Neuro:  CN: Pupils are equal and round. They are symmetrically reactive from 2-->1 mm. vision is poor and family reports that she is essentially blind at baseline, able to see nothing more than shadows. On observation, extraocular movements appear intact with some breakup of smooth pursuits at times. No nystagmus. Facial sensation is intact to light touch. Face is symmetric at rest with normal strength and mobility. Hearing is intact to conversational voice. Palate elevates symmetrically and uvula is midline. Bilateral SCM and trapezii are 5/5. Tongue is midline with normal bulk and mobility.  Motor: Reduced bulk throughout. Tone appears normal. There may be some mild weakness in the left upper extremity but exam is somewhat inconsistent. No tremor or other abnormal movements. No drift.  Sensation: This appears to be intact to light touch.  DTRs: 2+ in the arms, absent in the legs. Toes downgoing bilaterally. No pathologic reflexes.    Labs:  Lab Results  Component Value Date   WBC 5.2 11/21/2015   HGB 12.2 11/21/2015   HCT 36.0 11/21/2015   PLT 288 11/21/2015   GLUCOSE 197 (H) 11/21/2015   ALT 13 (L) 11/21/2015   AST 30 11/21/2015   NA 135 11/21/2015   K 3.3 (L) 11/21/2015   CL 94 (L) 11/21/2015   CREATININE 3.20 (H) 11/21/2015   BUN 19 11/21/2015   CO2 27 11/21/2015   INR 1.29 11/21/2015   HGBA1C 8.0 (H) 11/11/2015   Troponin 0.05  Imaging:  I have personally and independently reviewed the CT scan of the head  without contrast from today. This shows a well-demarcated area of hypodensity involving the medial aspect of the left temporal lobe extending back into the left parieto-occipital junction. A second area of well-defined hypodensity is noted in the posterior aspect of the right temporal lobe. These are most suggestive of subacute embolic ischemic infarctions. There appears to be  a mild amount of petechial hemorrhage within these areas of hypodensity. A small focal area of hypodensity is noted in the right parietal lobe that is subcortical in location, suggestive of subacute to chronic infarction. There is a punctate area of calcification in the left frontoparietal region. Moderate to severe chronic small vessel ischemic changes noted in the bihemispheric white matter. Atherosclerotic calcification is noted within the vertebrals, basilar, internal carotids, and MCAs (right more than left).  I have personally and independently reviewed the MRI scan of the brain from tonight. As was a limited study and was terminated prematurely as the patient was unable to remain still. The scan consists of axial and coronal DWI/ADC as well as axial FLAIR images. She has a large area of restricted diffusion involving the medial aspect of the left temporal lobe extending into the left parietal and occipital lobes, consistent with acute ischemic infarction. An additional area of restricted diffusion is present in the posterolateral aspect of right temporal lobe, also consistent with acute ischemic infarction. An additional area of restricted diffusion is seen high in the right parietal lobe that appears consistent with some acute ischemia surrounding an older area of infarction. This scan is consistent with embolic ischemic strokes.  Other diagnostic studies:  TTE from 11/13/15: Moderate concentric LVH; ejection fraction 55-60 percent; no regional wall motion abnormalities; grade 3 diastolic dysfunction; aortic sclerosis noted without  stenosis; calcification of the mitral valve annulus with restricted mobility of the posterior mitral leaflet was noted; the left and right atria were severely dilated; no atrial septal defect or PFO were identified.  Assessment and Plan:  1. Acute Ischemic Stroke: This is an acute stroke involving the right MCA and left PCA territories. It is most likely cardioembolic in nature given underlying atrial fibrillation for which she is not taking anticoagulation because of increased risk of hemorrhagic complications. Known risk factors for cerebrovascular disease in this patient include afib not on anticoagulation, diabetes, hypertension, and age. She just had a TTE performed about one week ago so no real need to repeat at this time. Will check fasting lipids. The dilemma remains the same regarding treatment moving forward. She remains high risk for bleeding with chronic anticoagulation though has now experienced stroke because of her atrial fibrillation. At this time, recommend continuing aspirin until the patient can have another conversation regarding risks/benefits of anticoagulation after stroke. She has elected to continue aspirin therapy to date and it is not clear if the fact that she has not had a stroke will change this. She is presently not taking a statin given her underlying cirrhosis. Ensure adequate glucose control. Allow permissive hypertension in the acute phase, treating only SBP greater than 220 mmHg and/or DBP greater than 110 mmHg. Avoid fever and hyperglycemia as these can extend the infarct. Avoid hypotonic IVF to minimize exacerbation of post-stroke edema. Initiate rehab services. DVT prophylaxis as needed.   2. Acute encephalopathy: Family describes acute confusion today, which is likely the result of her acute stroke. This seems to have improved. Continue to observe. Optimize metabolic status is much as possible. Limit CNS active medications, specifically benzodiazepines, opiates, and  anything with strong anticholinergic properties.  3. Headache/neck pain: The patient is complaining of left-sided headache and left-sided neck pain. This may be related to the PCA territory stroke on that side. Recommend symptomatic treatment, preferably with non-opiate analgesia if at all possible. If required, opiates should be used at the lowest effective dose and the shortest necessary duration to minimize risk  of increasing encephalopathy as a result of medication effect.  This was discussed with the patient with the assistance of family members transiting. This is also discussed with the family members at the bedside. They are in agreement with the plan as noted. There were given the opportunity to ask any questions and these were addressed to their satisfaction.  I discussed my findings with Dr. Rogene [place] at the time of my consultation.  Thank you for the opportunity to participate in this patient's care. Please feel free to call with any questions or concerns. The stroke team will assume care of the patient beginning 11/22/15.

## 2015-11-21 NOTE — ED Triage Notes (Signed)
Patient was at dialysis and the dialysis center noted new onset of altered mental status.  Family and dialysis  center states that she is usually A&Ox4.  Had 50 minutes left of dialysis today, got 1.3 of the 3.1 Kg off.  Only speaks spanish and had a translator at the dialysis center to get information from.

## 2015-11-21 NOTE — Progress Notes (Signed)
Called ER RN for report. Room ready.  

## 2015-11-21 NOTE — ED Notes (Signed)
Pt had a surgery in her right arm to remove a blood clot about a week ago per family.

## 2015-11-21 NOTE — ED Notes (Signed)
Pt to MRI

## 2015-11-21 NOTE — ED Provider Notes (Signed)
[facility] DEPT Provider Note   CSN: [id number] Arrival date & time: 11/21/15  1623     History   Chief Complaint Chief Complaint  Patient presents with  . Altered Mental Status    HPI [name] is a 75 y.o. female.  Patient sent in from dialysis center. Patient received about half of her normal dialysis. Her normal days are Tuesdays Thursdays and Saturdays. During dialysis about half way through she had a change in her mental status and she was referred in for evaluation patient brought in by EMS. Upon arrival patient seemed to be alert seems to follow commands. Family member excellent interpreter. Patient without any specific complaint. Patient status post right radial artery embolectomy about a week ago. Patient receives dialysis through her left arm AV fistula.      Past Medical History:  Diagnosis Date  . Atrial fibrillation (HCC)    on ASA  . Bladder cancer [facility]) Jan 10, 2006   BLADDER NECK, BIOPSY: DIFFUSE LARGE B-CELL LYMPHOMA. CT showed worrisome pelvic LN. No treatment records in EPIC.  Marland Kitchen Blind 2005  . Chronic liver disease    non-alcoholic cirrhosis  . Constipation   . Diabetes mellitus   . ESRD on dialysis [facility])    - Dialysis- [place]  . History of blood transfusion   . Hyperparathyroidism, secondary ([place])   . Hypertension   . Non-Hodgkin lymphoma ([place]) 2007  . Pulmonary HTN   . Radiation    for bladder cancer    Patient Active Problem List   Diagnosis Date Noted  . Thromboembolism ([place]) 11/10/2015  . Cardiac murmur 11/10/2015  . Constipation 01/27/2014  . Atrial fibrillation ([place]) 11/22/2013  . Cirrhosis, non-alcoholic ([place]) 123456  . Left inguinal hernia 03/24/2013  . Axillary lymphadenopathy 03/12/2013  . Abnormal EKG 04/08/2012  . Hypercalcemia associated with chronic dialysis 04/08/2012  . Non-Hodgkin's lymphoma of extranodal site ([place]) 10/16/2011  . ESRD (end stage renal disease) on dialysis ([place])  05/05/2011  . Dizziness 05/05/2011  . Diabetes mellitus type II, non insulin dependent ([place]) 05/05/2011  . Hypertension 05/05/2011    Past Surgical History:  Procedure Laterality Date  . AV FISTULA PLACEMENT Right   . AV FISTULA PLACEMENT Left   . COLONOSCOPY    . FINGER SURGERY Left    Skin graft - skin graft  . INSERTION OF DIALYSIS CATHETER    . 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 THROMBECTOMY BRACHIAL ARTERY Right 11/10/2015   Procedure: RIGHT THROMBECTOMY BRACHIAL ARTERY;  Surgeon: Conrad Bry[place]r, MD;  Location: [facility];  Service: Vascular;  Laterality: Right;    OB History    No data available       Home Medications    Prior to Admission medications   Medication Sig Start Date End Date Taking? Authorizing Provider  acetaminophen (TYLENOL) 500 MG tablet Take 500 mg by mouth every 6 (six) hours as needed for headache (pain).    Yes Historical Provider, MD  aspirin EC 81 MG tablet Take 1 tablet (81 mg total) by mouth daily. 11/13/15  Yes Belkys A Regalado, MD  cinacalcet (SENSIPAR) 60 MG tablet Take 60 mg by mouth 2 (two) times daily.   Yes Historical Provider, MD  glipiZIDE (GLUCOTROL) 5 MG tablet Take 5 mg by mouth 2 (two) times daily. 09/18/15  Yes Historical Provider, MD  labetalol (NORMODYNE) 100 MG tablet Take 100 mg by mouth See admin instructions. Take 1 tablet (100 mg) by mouth Sunday, Monday, Wednesday, Friday morning (non-dialysis days), take 1 tablet (100 mg) daily at bedtime   Yes Historical Provider, MD  Multiple Vitamin (MULTIVITAMIN WITH MINERALS) TABS tablet Take 1 tablet by mouth daily.   Yes Historical Provider, MD  Nutritional Supplements (FEEDING SUPPLEMENT, NEPRO CARB STEADY,) LIQD Take 237 mLs by mouth 3 (three) times daily as needed (Supplement). 11/13/15  Yes Belkys A Regalado, MD    omeprazole (PRILOSEC) 20 MG capsule Take 20 mg by mouth daily.  04/08/13  Yes Historical Provider, MD  oxyCODONE-acetaminophen (ROXICET) 5-325 MG tablet Take 1 tablet by mouth every 6 (six) hours as needed for severe pain. 11/13/15  Yes Belkys A Regalado, MD  sevelamer carbonate (RENVELA) 800 MG tablet Take 1,600 mg by mouth 3 (three) times daily before meals.   Yes Historical Provider, MD  sorbitol 70 % solution take 30 milliliters by mouth once daily if needed for constipation 01/12/14  Yes Midge Minium, MD    Family History Family History  Problem Relation Age of Onset  . Thyroid disease Mother   . Lung cancer Father   . Colon cancer Neg Hx   . Breast cancer Neg Hx     Social History Social History  Substance Use Topics  . Smoking status: Never Smoker  . Smokeless tobacco: Never Used  . Alcohol use No     Allergies   Roxicet [oxycodone-acetaminophen]   Review of Systems Review of Systems  Constitutional: Negative for fever.  HENT: Negative for congestion.   Eyes: Positive for visual disturbance.  Respiratory: Negative for shortness of breath.   Cardiovascular: Negative for chest pain.  Gastrointestinal: Negative for abdominal pain.  Musculoskeletal: Negative for back pain.  Skin: Negative for rash.  Neurological: Negative for headaches.  Hematological: Bruises/bleeds easily.  Psychiatric/Behavioral: Positive for confusion.     Physical Exam Updated Vital Signs BP (!) 145/49   Pulse (!) 55   Temp 98.7 F (37.1 C)   Resp 15   SpO2 99%   Physical Exam  Constitutional: She appears well-developed and well-nourished. No distress.  HENT:  Head: Normocephalic and atraumatic.  Mouth/Throat: Oropharynx is clear and moist.  Eyes: Conjunctivae and EOM are normal. Pupils are equal, round, and reactive to light.  Neck: Normal range of motion. Neck supple.  Cardiovascular: Normal rate, regular rhythm and normal heart sounds.   Bradycardic  Pulmonary/Chest:  Effort normal and breath sounds normal. No respiratory distress.  Abdominal: Soft. Bowel sounds are normal. There is no tenderness.  Musculoskeletal: Normal range of motion.  Left arm with AV fistula with good thrill. Right forearm with embo colectomy incision. Radial pulse right wrist present 1+.  Neurological: She is alert. She exhibits normal muscle tone. Coordination normal.  Pre-existing significant bilateral vision loss can only see shadows.  Skin: Skin is warm and dry.  Nursing note and vitals reviewed.    ED Treatments / Results  Labs (all labs ordered are listed, but only abnormal results are displayed) Labs Reviewed  CBC WITH DIFFERENTIAL/PLATELET - Abnormal; Notable for the following:       Result Value   RBC 3.29 (*)    Hemoglobin 11.0 (*)    HCT 33.8 (*)    MCV 102.7 (*)    All other components within normal limits  COMPREHENSIVE METABOLIC PANEL - Abnormal; Notable for the following:  Potassium 3.1 (*)    Chloride 96 (*)    Glucose, Bld 197 (*)    Creatinine, Ser 3.29 (*)    Albumin 3.1 (*)    ALT 13 (*)    GFR calc non Af Amer 13 (*)    GFR calc Af Amer 15 (*)    All other components within normal limits  PROTIME-INR - Abnormal; Notable for the following:    Prothrombin Time 16.2 (*)    All other components within normal limits  I-STAT CHEM 8, ED - Abnormal; Notable for the following:    Potassium 3.3 (*)    Chloride 94 (*)    Creatinine, Ser 3.20 (*)    Glucose, Bld 197 (*)    Calcium, Ion 1.08 (*)    All other components within normal limits  I-STAT TROPOININ, ED    EKG  EKG Interpretation None       Radiology Dg Chest 2 View  Result Date: 11/21/2015 CLINICAL DATA:  Altered mental status EXAM: CHEST  2 VIEW COMPARISON:  04/08/2012 FINDINGS: The heart is severely enlarged. Lungs are under aerated. Vascular congestion is present. Linear opacities at the lung bases are worrisome for interstitial edema. Small left pleural effusion. IMPRESSION:  Findings above are worrisome for mild volume overload. Electronically Signed   By: Marybelle Killings M.D.   On: 11/21/2015 17:50   Ct Head Wo Contrast  Result Date: 11/21/2015 CLINICAL DATA:  Altered mental status.  Atrial fibrillation EXAM: CT HEAD WITHOUT CONTRAST TECHNIQUE: Contiguous axial images were obtained from the base of the skull through the vertex without intravenous contrast. COMPARISON:  Head CT 05/05/2011 FINDINGS: Brain: There is a large region of hypoattenuation with blurring of gray-white differentiation in the posterior left temporal lobe. Additionally, there is an area of hypoattenuation in the mid right temporal lobe with associated loss of gray-white differentiation. Small areas of hyperdensity within the left temporal lobe may indicate petechial hemorrhage. However, there is no intraparenchymal hematoma. No midline shift or mass effect. There is periventricular hypoattenuation compatible with chronic microvascular disease. No extra-axial collection. Vascular: There is calcification within the basilar artery, intracranial internal carotid arteries and right middle cerebral artery. Skull: Normal visualized skull base, calvarium and extracranial soft tissues. Sinuses/Orbits: No sinus fluid levels or advanced mucosal thickening. No mastoid effusion. Normal orbits. IMPRESSION: 1. Acute to early subacute infarcts within the left greater than right temporal lobes. 2. Suspected small amount of petechial hemorrhage within the left temporal ischemic region. No intraparenchymal hematoma. 3. No midline shift or mass effect. Critical Value/emergent results were called by telephone at the time of interpretation on 11/21/2015 at 8:04 pm to Dr. Fredia Sorrow , who verbally acknowledged these results. Electronically Signed   By: Ulyses Jarred M.D.   On: 11/21/2015 20:04   Mr Brain Wo Contrast (neuro Protocol)  Result Date: 11/21/2015 CLINICAL DATA:  Stroke.  Dialysis patient. EXAM: MRI HEAD WITHOUT  CONTRAST TECHNIQUE: Multiplanar, multiecho pulse sequences of the brain and surrounding structures were obtained without intravenous contrast. COMPARISON:  CT head 11/21/2015 FINDINGS: Patient was not able to complete the study. The patient began moving after the diffusion sequences and did not complete further sequences after axial FLAIR imaging. Acute infarct in the left medial temporal lobe in the left PCA territory. This corresponds to the low-density area on CT. Gradient echo imaging not performed on MRI but no definite hemorrhage on the diffusion-weighted imaging. Acute infarct in the right posterior lateral temporal lobe and high right parietal lobe  in the right MCA territory. Generalized atrophy.  Negative for hydrocephalus. IMPRESSION: Acute infarct left medial temporal lobe in the left PCA territory. Acute infarct in the right temporoparietal lobe in the right MCA territory. Findings suggest emboli. Incomplete study due to lack of patient cooperation. Electronically Signed   By: Franchot Gallo M.D.   On: 11/21/2015 21:53    Procedures Procedures (including critical care time)  Medications Ordered in ED Medications  0.9 %  sodium chloride infusion (not administered)     Initial Impression / Assessment and Plan / ED Course  I have reviewed the triage vital signs and the nursing notes.  Pertinent labs & imaging results that were available during my care of the patient were reviewed by me and considered in my medical decision making (see chart for details).  Clinical Course   MRI confirms bilateral temporal parietal infarcts probably from embolic phenomenon. Neural hospitalist aware they will consult. Patient will be admitted to the hospitalist service. Patient remained stable here. Apparently when she had her right radial artery embolus there was conversations about going on blood thinners. But the they decided not to. That conversation will be readdressed by the admitting team and by the  neural hospitalist.   Final Clinical Impressions(s) / ED Diagnoses   Final diagnoses:  Altered mental status, unspecified altered mental status type  Cerebrovascular accident (CVA), unspecified mechanism ([place])    New Prescriptions New Prescriptions   No medications on file     Fredia Sorrow, MD 11/21/15 2215

## 2015-11-22 ENCOUNTER — Encounter (HOSPITAL_COMMUNITY): Payer: Self-pay

## 2015-11-22 DIAGNOSIS — I6340 Cerebral infarction due to embolism of unspecified cerebral artery: Secondary | ICD-10-CM

## 2015-11-22 DIAGNOSIS — E876 Hypokalemia: Secondary | ICD-10-CM | POA: Diagnosis present

## 2015-11-22 DIAGNOSIS — G9340 Encephalopathy, unspecified: Secondary | ICD-10-CM | POA: Diagnosis present

## 2015-11-22 DIAGNOSIS — M542 Cervicalgia: Secondary | ICD-10-CM | POA: Diagnosis present

## 2015-11-22 DIAGNOSIS — E7800 Pure hypercholesterolemia, unspecified: Secondary | ICD-10-CM

## 2015-11-22 LAB — LIPID PANEL
CHOL/HDL RATIO: 5.3 ratio
Cholesterol: 127 mg/dL (ref 0–200)
HDL: 24 mg/dL — ABNORMAL LOW (ref >40)
LDL Cholesterol: 77 mg/dL (ref 0–99)
Triglycerides: 129 mg/dL (ref <150)
VLDL: 26 mg/dL (ref 0–40)

## 2015-11-22 LAB — BASIC METABOLIC PANEL
ANION GAP: 15 (ref 5–15)
BUN: 19 mg/dL (ref 6–20)
CO2: 26 mmol/L (ref 22–32)
Calcium: 9.7 mg/dL (ref 8.9–10.3)
Chloride: 94 mmol/L — ABNORMAL LOW (ref 101–111)
Creatinine, Ser: 4.17 mg/dL — ABNORMAL HIGH (ref 0.44–1.00)
GFR calc Af Amer: 11 mL/min — ABNORMAL LOW (ref >60)
GFR, EST NON AFRICAN AMERICAN: 10 mL/min — AB (ref >60)
Glucose, Bld: 179 mg/dL — ABNORMAL HIGH (ref 65–99)
POTASSIUM: 3.4 mmol/L — AB (ref 3.5–5.1)
SODIUM: 135 mmol/L (ref 135–145)

## 2015-11-22 LAB — CBC
HCT: 34.4 % — ABNORMAL LOW (ref 36.0–46.0)
HEMOGLOBIN: 11.2 g/dL — AB (ref 12.0–15.0)
MCH: 33.6 pg (ref 26.0–34.0)
MCHC: 32.6 g/dL (ref 30.0–36.0)
MCV: 103.3 fL — AB (ref 78.0–100.0)
PLATELETS: 269 10*3/uL (ref 150–400)
RBC: 3.33 MIL/uL — AB (ref 3.87–5.11)
RDW: 15.3 % (ref 11.5–15.5)
WBC: 5.3 10*3/uL (ref 4.0–10.5)

## 2015-11-22 LAB — MRSA PCR SCREENING: MRSA by PCR: NEGATIVE

## 2015-11-22 LAB — GLUCOSE, CAPILLARY
GLUCOSE-CAPILLARY: 194 mg/dL — AB (ref 65–99)
GLUCOSE-CAPILLARY: 100 mg/dL — AB (ref 65–99)
GLUCOSE-CAPILLARY: 113 mg/dL — AB (ref 65–99)
GLUCOSE-CAPILLARY: 127 mg/dL — AB (ref 65–99)
Glucose-Capillary: 211 mg/dL — ABNORMAL HIGH (ref 65–99)

## 2015-11-22 MED ORDER — HYDRALAZINE HCL 20 MG/ML IJ SOLN: 10.0000 mg | INTRAMUSCULAR | Status: DC | PRN

## 2015-11-22 MED ORDER — PANTOPRAZOLE SODIUM 40 MG PO PACK
40.0000 mg | PACK | Freq: Every day | ORAL | Status: DC
  Administered 2015-11-22 – 2015-11-23 (×2): 40 mg
  Filled 2015-11-22 (×3): qty 20

## 2015-11-22 MED ORDER — ATORVASTATIN CALCIUM 20 MG PO TABS
20.0000 mg | ORAL_TABLET | Freq: Every day | ORAL | Status: DC
  Filled 2015-11-22: qty 1

## 2015-11-22 MED ORDER — TRAMADOL HCL 50 MG PO TABS
50.0000 mg | ORAL_TABLET | Freq: Two times a day (BID) | ORAL | Status: DC | PRN
  Filled 2015-11-22: qty 1

## 2015-11-22 MED ORDER — SEVELAMER CARBONATE 0.8 G PO PACK
1.6000 g | PACK | Freq: Three times a day (TID) | ORAL | Status: DC
  Administered 2015-11-23: 1.6 g via ORAL
  Filled 2015-11-22 (×4): qty 2

## 2015-11-22 MED ORDER — NEPRO/CARBSTEADY PO LIQD
237.0000 mL | Freq: Two times a day (BID) | ORAL | Status: DC
  Administered 2015-11-23: 237 mL via ORAL

## 2015-11-22 MED ORDER — POTASSIUM CHLORIDE CRYS ER 20 MEQ PO TBCR
40.0000 meq | EXTENDED_RELEASE_TABLET | ORAL | Status: AC
  Administered 2015-11-22: 40 meq via ORAL
  Filled 2015-11-22: qty 2

## 2015-11-22 NOTE — Evaluation (Signed)
Occupational Therapy Evaluation Patient Details Name: [name] MRN: [id number] DOB: 1940-04-14 Today's Date: 11/22/2015    History of Present Illness Pt admitted with AMS, L medial temporal lobe and R parietal lobe infarcts. Pt with recent admission for removal of blood clot in R UE. PMH: ESRD, HTN, HLD, DM, afib.   Clinical Impression   Pt seen with interpreter and family present. Pt was assisted for bathing, dressing and all IADL prior to admission. She ambulated in her home using the walls and furniture as guidance and support. Pt has baseline low vision. She presents with impaired cognition, generalized weakness slightly greater on L side, impaired balance with a tendency to maintain head turned to R. Pt has a supportive family who can provide 24 hour close supervision. Recommending HHOT as pt is more likely to thrive in the routine and familiar environment of home. Will follow acutely.   Follow Up Recommendations  Home health OT;Supervision/Assistance - 24 hour    Equipment Recommendations  3 in 1 bedside comode    Recommendations for Other Services       Precautions / Restrictions Precautions Precautions: Fall Precaution Comments: pt with low vision Restrictions Weight Bearing Restrictions: No      Mobility Bed Mobility               General bed mobility comments: pt in chair  Transfers Overall transfer level: Needs assistance Equipment used: 2 person hand held assist Transfers: Sit to/from Stand Sit to Stand: Min assist              Balance Overall balance assessment: Needs assistance Sitting-balance support: Feet supported Sitting balance-Leahy Scale: Fair       Standing balance-Leahy Scale: Poor                              ADL Overall ADL's : Needs assistance/impaired Eating/Feeding: Sitting;Minimal assistance               Upper Body Dressing : Maximal assistance;Sitting Upper Body Dressing Details (indicate  cue type and reason): front opening gown     Toilet Transfer: +2 for physical assistance;Minimal assistance;Ambulation;BSC   Toileting- Clothing Manipulation and Hygiene: Moderate assistance;Sit to/from stand       Functional mobility during ADLs: +2 for physical assistance;Minimal assistance (hands held)       Vision Additional Comments: pt unable to see details, reliant on trailing walls when ambulating at home, pt with noted head turn preference to R, but able to turn to L with request   Perception     Praxis      Pertinent Vitals/Pain Pain Assessment: Faces Faces Pain Scale: Hurts a little bit Pain Location: back Pain Descriptors / Indicators: Aching Pain Intervention(s): Repositioned;Monitored during session     Hand Dominance Right   Extremity/Trunk Assessment Upper Extremity Assessment Upper Extremity Assessment: Generalized weakness (slightly weaker/slower L UE vs R)   Lower Extremity Assessment Lower Extremity Assessment: Defer to PT evaluation       Communication Communication Communication: Expressive difficulties;Prefers language other than English   Cognition Arousal/Alertness: Awake/alert Behavior During Therapy: Flat affect Overall Cognitive Status: Impaired/Different from baseline Area of Impairment: Orientation;Memory;Following commands;Safety/judgement;Problem solving Orientation Level: Disoriented to;Person;Place;Time;Situation   Memory: Decreased short-term memory;Decreased recall of precautions Following Commands: Follows one step commands with increased time;Follows one step commands inconsistently (and multimodal cues) Safety/Judgement: Decreased awareness of safety;Decreased awareness of deficits   Problem Solving: Slow processing;Decreased initiation;Difficulty  sequencing;Requires verbal cues;Requires tactile cues General Comments: Pt demonstrating perseveration on last command or question.    General Comments       Exercises        Shoulder Instructions      Home Living Family/patient expects to be discharged to:: Private residence Living Arrangements: Spouse/significant other Available Help at Discharge: Family;Available 24 hours/day Type of Home: Apartment Home Access: Level entry     Home Layout: One level     Bathroom Shower/Tub: Teacher, early years/pre: Standard     Home Equipment: Shower seat          Prior Functioning/Environment Level of Independence: Needs assistance  Gait / Transfers Assistance Needed: walked using furniture inside home, hand held assist in community due to low vision ADL's / Homemaking Assistance Needed: husband assisted with bathing, dressing, pt self fed and toileted independently            OT Problem List: Decreased strength;Decreased activity tolerance;Impaired balance (sitting and/or standing);Impaired vision/perception;Decreased coordination;Decreased cognition;Decreased safety awareness;Decreased knowledge of use of DME or AE;Pain   OT Treatment/Interventions: Self-care/ADL training;DME and/or AE instruction;Therapeutic activities;Cognitive remediation/compensation;Patient/family education;Visual/perceptual remediation/compensation;Balance training    OT Goals(Current goals can be found in the care plan section) Acute Rehab OT Goals Patient Stated Goal: did not state OT Goal Formulation: Patient unable to participate in goal setting Time For Goal Achievement: 12/06/15 Potential to Achieve Goals: Good ADL Goals Pt Will Perform Grooming: standing;with min assist Pt Will Transfer to Toilet: with min assist;ambulating;bedside commode (over toilet, family member assisting) Pt Will Perform Toileting - Clothing Manipulation and hygiene: with min assist;sit to/from stand;with caregiver independent in assisting Additional ADL Goal #1: Pt will follow one step commands with minimal cues 75% of time.  OT Frequency: Min 2X/week   Barriers to D/C:             Co-evaluation PT/OT/SLP Co-Evaluation/Treatment: Yes Reason for Co-Treatment: For patient/therapist safety   OT goals addressed during session: ADL's and self-care      End of Session Equipment Utilized During Treatment: Gait belt Nurse Communication: Mobility status  Activity Tolerance: Patient limited by fatigue Patient left: in chair;with call bell/phone within reach;with chair alarm set;with family/visitor present   Time: 1030-1100 OT Time Calculation (min): 30 min Charges:  OT General Charges $OT Visit: 1 Procedure OT Evaluation $OT Eval Moderate Complexity: 1 Procedure G-Codes:    Malka So 11/22/2015, 1:12 PM  [phone]

## 2015-11-22 NOTE — Progress Notes (Signed)
PROGRESS NOTE    [name]  [id number] DOB: 1940/10/18 DOA: 11/21/2015 PCP: Kathlene November, MD     Brief Narrative:  [name] is a 75 y.o. spanish speaking only female with medical history significant of ESRD on HD(T/TH/Sat), HTN, HLD, DM, A.fib on ASA; who presented with complaints of altered mental status while receiving hemodialysis today. Patient was initially confused and did not recall who family members are, but after being in the emergency room for some time was then able to recognize family members. Associated symptoms include generalized weakness and complaints of neck discomfort from laying in the hospital gurney. Recently hospitalized from 9/29-10/2 at [facility] where she underwent a right radial artery embolectomy by Dr Bridgett Larsson. It was discussed with her the need to be placed on anticoagulation at that time, but family notes there was concerned with her history of liver cirrhosis.  Assessment & Plan:   Principal Problem:   CVA (cerebral vascular accident) ([place]) Active Problems:   ESRD (end stage renal disease) on dialysis ([place])   Diabetes mellitus type II, non insulin dependent ([place])   Atrial fibrillation ([place])   Acute encephalopathy   Neck pain   Hypokalemia  CVA, left PCA and right MCA, suggestive of embolic etiology  - CT head showed acute infarcts within L > R temporal lobes. Suspect small amt petechial hemorrhage within left temporal ischemic region without intraparenchymal hematoma.  - MRI brain showed acute infarct left medial temporal lobe in the left PCA territory. Acute infarct in the right temporoparietal lobe in the right MCA territory. Findings suggest emboli  -Neurology following - PT/OT/speech eval and treat  - Continue aspirin - Allow for permissive hypertension in acute phase illness  - Carotid US pending   Atrial fibrillation: Chronic.  - CHA2DS2-VASc score = 5. Apparently discussed with family at last hospitalization risks and benefits of starting  anticoagulation. Family and patient decided to continue aspirin and deferred anticoagulation at that time as patient has underlying cirrhosis and hx frequent falls.  - Continue aspirin for now   - Had a long discussion with family today with interpreter present. Currently, family is leaning toward NOT starting anticoagulation due to risk of bleed, even though the alternative would increase risk of another stroke. Family will discuss with the rest of family not present at discussion today.   Acute encephalopathy - Resolved, likely secondary to CVA  - Continue to monitor - Limiting sedating medications, benzo, opiates, anticholinergics   Diastolic heart failure - Patient's last EF 11/13/15 showed ejection fraction of 55-60% with grade 3 diastolic dysfunction. Patient was noted to have significant bilateral atrial dilation.  Neck pain - Tylenol prn, limit narcotic use   ESRD on HD(T/TH/Sat) - Nephro/HD following  - Continue Sensipar and Renvela  Hypokalemia potassium initially 3.1 on admission. - Replaced at time of admission   Diabetes mellitus type 2, uncontrolled, with complication including ESRD and CAD - Ha1c pending  - Hold glipizide  - CBGs every before meals with sensitive sliding scale insulin  Anemia of chronic disease -Stable   Essential hypertension  - Hold Labetalol to allow permissive HTN    DVT prophylaxis: heparin subq Code Status: full Family Communication: family at bedside Disposition Plan: [facility] 10/12 with home health PT    Consultants:   Neurology   Procedures:   none  Antimicrobials:   None    Subjective: Patient without complaints today. Per family, her confusion is much improved, although not at baseline. No Cp, SOB,  N/V, abdominal pain. +overall weakness but no focal deficits   Objective: Vitals:   11/22/15 0428 11/22/15 0620 11/22/15 0800 11/22/15 1436  BP: (!) 147/39 (!) 138/58 (!) 130/33 (!) 204/45  Pulse: (!) 57 60  (!) 55 (!) 48  Resp: 18 16 14 12   Temp: 98.8 F (37.1 C) 99.7 F (37.6 C) 99.5 F (37.5 C) 98.4 F (36.9 C)  TempSrc: Oral Axillary Oral Oral  SpO2: 97% 98% 99% 96%  Weight:      Height:        Intake/Output Summary (Last 24 hours) at 11/22/15 1508 Last data filed at 11/22/15 1437  Gross per 24 hour  Intake               60 ml  Output                0 ml  Net               60 ml   Filed Weights   11/21/15 2321 11/22/15 0007  Weight: 54.2 kg (119 lb 7.8 oz) 54.2 kg (119 lb 7.8 oz)    Examination:  General exam: Appears calm and comfortable, +vision loss  Respiratory system: Clear to auscultation. Respiratory effort normal. Cardiovascular system: S1 & S2 heard, irreg irreg. No JVD, murmurs, rubs, gallops or clicks. No pedal edema. Gastrointestinal system: Abdomen is +distended, soft and nontender. No organomegaly or masses felt. Normal bowel sounds heard. Central nervous system: Alert. No focal neurological deficits. Extremities: Symmetric 5 x 5 power. Skin: No rashes, lesions or ulcers  Data Reviewed: I have personally reviewed following labs and imaging studies  CBC:  Recent Labs Lab 11/21/15 1825 11/21/15 1847 11/22/15 0716  WBC 5.2  --  5.3  NEUTROABS 3.2  --   --   HGB 11.0* 12.2 11.2*  HCT 33.8* 36.0 34.4*  MCV 102.7*  --  103.3*  PLT 288  --  [id number]   Basic Metabolic Panel:  Recent Labs Lab 11/21/15 1825 11/21/15 1847 11/22/15 0716  NA 136 135 135  K 3.1* 3.3* 3.4*  CL 96* 94* 94*  CO2 27  --  26  GLUCOSE 197* 197* 179*  BUN 15 19 19   CREATININE 3.29* 3.20* 4.17*  CALCIUM 9.3  --  9.7   GFR: Estimated Creatinine Clearance: 8.6 mL/min (by C-G formula based on SCr of 4.17 mg/dL (H)). Liver Function Tests:  Recent Labs Lab 11/21/15 1825  AST 30  ALT 13*  ALKPHOS 76  BILITOT 0.9  PROT 7.0  ALBUMIN 3.1*   No results for input(s): LIPASE, AMYLASE in the last 168 hours. No results for input(s): AMMONIA in the last 168 hours. Coagulation  Profile:  Recent Labs Lab 11/21/15 1825  INR 1.29   Cardiac Enzymes: No results for input(s): CKTOTAL, CKMB, CKMBINDEX, TROPONINI in the last 168 hours. BNP (last 3 results) No results for input(s): PROBNP in the last 8760 hours. HbA1C: No results for input(s): HGBA1C in the last 72 hours. CBG:  Recent Labs Lab 11/22/15 0221 11/22/15 0743 11/22/15 1156  GLUCAP 211* 194* 100*   Lipid Profile:  Recent Labs  11/22/15 0418  CHOL 127  HDL 24*  LDLCALC 77  TRIG 129  CHOLHDL 5.3   Thyroid Function Tests: No results for input(s): TSH, T4TOTAL, FREET4, T3FREE, THYROIDAB in the last 72 hours. Anemia Panel: No results for input(s): VITAMINB12, FOLATE, FERRITIN, TIBC, IRON, RETICCTPCT in the last 72 hours. Sepsis Labs: No results for input(s): PROCALCITON, LATICACIDVEN in  the last 168 hours.  Recent Results (from the past 240 hour(s))  MRSA PCR Screening     Status: None   Collection Time: 11/22/15  5:45 AM  Result Value Ref Range Status   MRSA by PCR NEGATIVE NEGATIVE Final    Comment:        The GeneXpert MRSA Assay (FDA approved for NASAL specimens only), is one component of a comprehensive MRSA colonization surveillance program. It is not intended to diagnose MRSA infection nor to guide or monitor treatment for MRSA infections.        Radiology Studies: Dg Chest 2 View  Result Date: 11/21/2015 CLINICAL DATA:  Altered mental status EXAM: CHEST  2 VIEW COMPARISON:  04/08/2012 FINDINGS: The heart is severely enlarged. Lungs are under aerated. Vascular congestion is present. Linear opacities at the lung bases are worrisome for interstitial edema. Small left pleural effusion. IMPRESSION: Findings above are worrisome for mild volume overload. Electronically Signed   By: Marybelle Killings M.D.   On: 11/21/2015 17:50   Ct Head Wo Contrast  Result Date: 11/21/2015 CLINICAL DATA:  Altered mental status.  Atrial fibrillation EXAM: CT HEAD WITHOUT CONTRAST TECHNIQUE:  Contiguous axial images were obtained from the base of the skull through the vertex without intravenous contrast. COMPARISON:  Head CT 05/05/2011 FINDINGS: Brain: There is a large region of hypoattenuation with blurring of gray-white differentiation in the posterior left temporal lobe. Additionally, there is an area of hypoattenuation in the mid right temporal lobe with associated loss of gray-white differentiation. Small areas of hyperdensity within the left temporal lobe may indicate petechial hemorrhage. However, there is no intraparenchymal hematoma. No midline shift or mass effect. There is periventricular hypoattenuation compatible with chronic microvascular disease. No extra-axial collection. Vascular: There is calcification within the basilar artery, intracranial internal carotid arteries and right middle cerebral artery. Skull: Normal visualized skull base, calvarium and extracranial soft tissues. Sinuses/Orbits: No sinus fluid levels or advanced mucosal thickening. No mastoid effusion. Normal orbits. IMPRESSION: 1. Acute to early subacute infarcts within the left greater than right temporal lobes. 2. Suspected small amount of petechial hemorrhage within the left temporal ischemic region. No intraparenchymal hematoma. 3. No midline shift or mass effect. Critical Value/emergent results were called by telephone at the time of interpretation on 11/21/2015 at 8:04 pm to Dr. Fredia Sorrow , who verbally acknowledged these results. Electronically Signed   By: Ulyses Jarred M.D.   On: 11/21/2015 20:04   Mr Brain Wo Contrast (neuro Protocol)  Result Date: 11/21/2015 CLINICAL DATA:  Stroke.  Dialysis patient. EXAM: MRI HEAD WITHOUT CONTRAST TECHNIQUE: Multiplanar, multiecho pulse sequences of the brain and surrounding structures were obtained without intravenous contrast. COMPARISON:  CT head 11/21/2015 FINDINGS: Patient was not able to complete the study. The patient began moving after the diffusion  sequences and did not complete further sequences after axial FLAIR imaging. Acute infarct in the left medial temporal lobe in the left PCA territory. This corresponds to the low-density area on CT. Gradient echo imaging not performed on MRI but no definite hemorrhage on the diffusion-weighted imaging. Acute infarct in the right posterior lateral temporal lobe and high right parietal lobe in the right MCA territory. Generalized atrophy.  Negative for hydrocephalus. IMPRESSION: Acute infarct left medial temporal lobe in the left PCA territory. Acute infarct in the right temporoparietal lobe in the right MCA territory. Findings suggest emboli. Incomplete study due to lack of patient cooperation. Electronically Signed   By: Franchot Gallo M.D.   On:  11/21/2015 21:53      Scheduled Meds: .  stroke: mapping our early stages of recovery book   Does not apply Once  .  stroke: mapping our early stages of recovery book   Does not apply Once  . aspirin  300 mg Rectal Daily   Or  . aspirin  325 mg Oral Daily  . cinacalcet  60 mg Oral BID WC  . feeding supplement (NEPRO CARB STEADY)  237 mL Oral BID BM  . heparin  5,000 Units Subcutaneous Q8H  . insulin aspart  0-9 Units Subcutaneous TID WC  . multivitamin with minerals  1 tablet Oral Daily  . pantoprazole sodium  40 mg Per Tube Daily  . sevelamer carbonate  1,600 mg Oral TID WC   Continuous Infusions:    LOS: 1 day    Time spent: 40 minutes    Dessa Phi, DO [facility] Pager [phone]  If 7PM-7AM, please contact night-coverage [email or web address] Password [email or web address] 11/22/2015, 3:08 PM

## 2015-11-22 NOTE — Plan of Care (Signed)
Problem: Education: Goal: Knowledge of secondary prevention will improve Outcome: Progressing Spanish hand book of early stages of recovery of stroke given to the patient's family.

## 2015-11-22 NOTE — Progress Notes (Signed)
New Admission Note:  Arrival Method: Stretcher Mental Orientation: alert to self only Telemetry: Box 2 Assessment: Completed Skin: Warm and dry. IV: NSL Pain: Denies Tubes: N/A Safety Measures: Safety Fall Prevention Plan was given, discussed and signed. Admission: Completed [address] Orientation: Patient has been orientated to the room, unit and the staff. Family: Husband and family members in to see patient. Translated for the patient.   Orders have been reviewed and implemented. Will continue to monitor the patient. Call light has been placed within reach and bed alarm has been activated.   Sima Matas BSN, RN  Phone Number: [phone]

## 2015-11-22 NOTE — Evaluation (Signed)
Physical Therapy Evaluation Patient Details Name: [name] MRN: [id number] DOB: 10-09-1940 Today's Date: 11/22/2015   History of Present Illness  Pt admitted with AMS, L medial temporal lobe and R parietal lobe infarcts. Pt with recent admission for removal of blood clot in R UE. PMH: ESRD, HTN, HLD, DM, afib.  Clinical Impression   Pt admitted with above diagnosis. Pt currently with functional limitations due to the deficits listed below (see PT Problem List). She presents with impaired cognition, generalized weakness slightly greater on L side, impaired balance with a tendency to maintain head turned to R. Pt will benefit from skilled PT to increase their independence and safety with mobility to allow discharge to the venue listed below.       Follow Up Recommendations Home health PT;Supervision/Assistance - 24 hour    Equipment Recommendations  3in1 (PT);Other (comment) (Would consider RW, however it is likely the RW would potentially impede her walking in her own home)    Recommendations for Other Services       Precautions / Restrictions Precautions Precautions: Fall Precaution Comments: pt with low vision Restrictions Weight Bearing Restrictions: No      Mobility  Bed Mobility               General bed mobility comments: pt in chair  Transfers Overall transfer level: Needs assistance Equipment used: 2 person hand held assist Transfers: Sit to/from Stand Sit to Stand: Min assist         General transfer comment: Min assist to steady at initial stand; overall good rise  Ambulation/Gait Ambulation/Gait assistance: +2 safety/equipment Ambulation Distance (Feet): 40 Feet Assistive device: 2 person hand held assist Gait Pattern/deviations: Decreased step length - right;Decreased step length - left;Wide base of support     General Gait Details: Quite nervous with walking in unfamiliar environment; very fatigued with walking; noted R lean with onset of  fatigue, abel to correct with cues  Stairs            Wheelchair Mobility    Modified Rankin (Stroke Patients Only) Modified Rankin (Stroke Patients Only) Pre-Morbid Rankin Score: Slight disability Modified Rankin: Moderate disability     Balance Overall balance assessment: Needs assistance Sitting-balance support: Feet supported Sitting balance-Leahy Scale: Fair       Standing balance-Leahy Scale: Poor                               Pertinent Vitals/Pain Pain Assessment: Faces Faces Pain Scale: Hurts a little bit Pain Location: back Pain Descriptors / Indicators: Aching Pain Intervention(s): Repositioned    Home Living Family/patient expects to be discharged to:: Private residence Living Arrangements: Spouse/significant other Available Help at Discharge: Family;Available 24 hours/day Type of Home: Apartment Home Access: Level entry     Home Layout: One level Home Equipment: Shower seat      Prior Function Level of Independence: Needs assistance   Gait / Transfers Assistance Needed: walked using furniture inside home, hand held assist in community due to low vision  ADL's / Homemaking Assistance Needed: husband assisted with bathing, dressing, pt self fed and toileted independently        Hand Dominance   Dominant Hand: Right    Extremity/Trunk Assessment   Upper Extremity Assessment: Defer to OT evaluation           Lower Extremity Assessment: Generalized weakness;LLE deficits/detail   LLE Deficits / Details: Noted slightly weaker LLE compared  to R; motor apraxia noted     Communication   Communication: Expressive difficulties;Prefers language other than English  Cognition Arousal/Alertness: Awake/alert Behavior During Therapy: Flat affect Overall Cognitive Status: Impaired/Different from baseline Area of Impairment: Orientation;Memory;Following commands;Safety/judgement;Problem solving Orientation Level: Disoriented  to;Person;Place;Time;Situation   Memory: Decreased short-term memory;Decreased recall of precautions Following Commands: Follows one step commands with increased time;Follows one step commands inconsistently (and multimodal cues) Safety/Judgement: Decreased awareness of safety;Decreased awareness of deficits   Problem Solving: Slow processing;Decreased initiation;Difficulty sequencing;Requires verbal cues;Requires tactile cues General Comments: Pt demonstrating perseveration on last command or question.     General Comments General comments (skin integrity, edema, etc.): Session conducted with Elta [place], Spanish Interpreter's assist    Exercises     Assessment/Plan    PT Assessment Patient needs continued PT services  PT Problem List Decreased strength;Decreased activity tolerance;Decreased balance;Decreased mobility;Decreased coordination;Decreased knowledge of use of DME;Decreased cognition;Decreased safety awareness          PT Treatment Interventions DME instruction;Gait training;Functional mobility training;Therapeutic activities;Therapeutic exercise;Balance training;Patient/family education;Neuromuscular re-education;Cognitive remediation    PT Goals (Current goals can be found in the Care Plan section)  Acute Rehab PT Goals Patient Stated Goal: did not state PT Goal Formulation: With family Time For Goal Achievement: 12/06/15 Potential to Achieve Goals: Good    Frequency Min 4X/week   Barriers to discharge        Co-evaluation PT/OT/SLP Co-Evaluation/Treatment: Yes Reason for Co-Treatment: For patient/therapist safety;Other (comment) (for timing working with interpreter) PT goals addressed during session: Mobility/safety with mobility OT goals addressed during session: ADL's and self-care       End of Session Equipment Utilized During Treatment: Gait belt Activity Tolerance: Patient tolerated treatment well Patient left: in chair;with call bell/phone within  reach;with chair alarm set Nurse Communication: Mobility status         Time: 1030-1100 PT Time Calculation (min) (ACUTE ONLY): 30 min   Charges:   PT Evaluation $PT Eval Moderate Complexity: 1 Procedure     PT G CodesQuin Hoop 11/22/2015, 2:32 PM  Roney Marion, [place] Pager [phone] Office [phone]

## 2015-11-22 NOTE — Progress Notes (Signed)
STROKE TEAM PROGRESS NOTE   HISTORY OF PRESENT ILLNESS (per record) This is a 75 year old right-handed woman who is Spanish-speaking only. History is obtained with the assistance of family members at the bedside who speaks excellent [place].  Her family reports that she was in her usual state of health until this morning. She took some pain medication and her diabetes medication and about 20-30 minutes later seemed to be confused. She was scheduled for hemodialysis today and went to her appointment as usual. However, about 2-3 hours into treatment, she was noted to be markedly confused. This prompted a trip to the emergency department for further evaluation. CT scan of the head revealed hypodensities in both temporal lobes, concerning for stroke. Her family reports that she is much improved with regards to her mental status at this time. They have not appreciated any focal weakness or other deficits beyond her mental status change.  She was recently admitted to [facility] from 9/29-10/2/17 after presenting to the emergency room with right arm pain. Workup revealed thrombosis of the right radial artery and she underwent thrombectomy on 11/10/15. She was maintained on a heparin drip during her hospitalization. She was taking aspirin at the time of admission with a known history of atrial fibrillation. A transthoracic echocardiogram on 11/13/15 showed severe dilation of both atria without PFO or ASD, no mention of intracardiac thrombus. According to the hospitalist's discharge summary, they discussed with the family that the patient is high risk for bleeding with anti-coagulation because of her underlying cirrhosis and history of frequent falls but without anti-coagulation she was at high risk of additional embolic events, including stroke. Following this discussion, the patient and her family decided to continue aspirin and deferred anticoagulation.   SUBJECTIVE (INTERVAL HISTORY) Daughter and sister at  bedside. Interpretor all at bedside for translation. Pt sitting in chair comfortably. Pt had recent admission for right radial artery thrombosis s/p thrombectomy due to afib. Pt family decided ASA on discharge instead of anticoagulation. Discussed with them again today the risk and benefit of anticoagulation and they would like more time to discuss among families.    OBJECTIVE Temp:  [98.3 F (36.8 C)-99.7 F (37.6 C)] 98.4 F (36.9 C) (10/11 1436) Pulse Rate:  [46-60] 48 (10/11 1436) Cardiac Rhythm: Atrial fibrillation (10/11 1058) Resp:  [12-18] 12 (10/11 1436) BP: (111-204)/(33-70) 204/45 (10/11 1436) SpO2:  [96 %-100 %] 96 % (10/11 1436) Weight:  [119 lb 7.8 oz (54.2 kg)] 119 lb 7.8 oz (54.2 kg) (10/11 0007)  CBC:   Recent Labs Lab 11/21/15 1825 11/21/15 1847 11/22/15 0716  WBC 5.2  --  5.3  NEUTROABS 3.2  --   --   HGB 11.0* 12.2 11.2*  HCT 33.8* 36.0 34.4*  MCV 102.7*  --  103.3*  PLT 288  --  [id number]    Basic Metabolic Panel:   Recent Labs Lab 11/21/15 1825 11/21/15 1847 11/22/15 0716  NA 136 135 135  K 3.1* 3.3* 3.4*  CL 96* 94* 94*  CO2 27  --  26  GLUCOSE 197* 197* 179*  BUN 15 19 19   CREATININE 3.29* 3.20* 4.17*  CALCIUM 9.3  --  9.7    Lipid Panel:     Component Value Date/Time   CHOL 127 11/22/2015 0418   TRIG 129 11/22/2015 0418   HDL 24 (L) 11/22/2015 0418   CHOLHDL 5.3 11/22/2015 0418   VLDL 26 11/22/2015 0418   LDLCALC 77 11/22/2015 0418   HgbA1c:  Lab Results  Component  Value Date   HGBA1C 8.0 (H) 11/11/2015   Urine Drug Screen: No results found for: LABOPIA, COCAINSCRNUR, LABBENZ, AMPHETMU, THCU, LABBARB    IMAGING I have personally reviewed the radiological images below and agree with the radiology interpretations.  Dg Chest 2 View 11/21/2015 Findings above are worrisome for mild volume overload.   Ct Head Wo Contrast 11/21/2015 1. Acute to early subacute infarcts within the left greater than right temporal lobes. 2. Suspected  small amount of petechial hemorrhage within the left temporal ischemic region. No intraparenchymal hematoma. 3. No midline shift or mass effect.   Mr Brain Wo Contrast (neuro Protocol) 11/21/2015 Acute infarct left medial temporal lobe in the left PCA territory. Acute infarct in the right temporoparietal lobe in the right MCA territory. Findings suggest emboli. Incomplete study due to lack of patient cooperation.   2D Echocardiogram  Left ventricle: The cavity size was normal. There was moderate concentric hypertrophy. Systolic function was normal. The estimated ejection fraction was in the range of 55% to 60%. Wall motion was normal; there were no regional wall motion abnormalities. Doppler parameters are consistent with a reversible restrictive pattern, indicative of decreased left ventricular diastolic compliance and/or increased left atrial pressure (grade 3 diastolic dysfunction). - Aortic valve: Right coronary and left coronary cusp mobility was restricted. Sclerosis without stenosis. There was no regurgitation. Valve area (VTI): 1.56 cm^2. Valve area (Vmax): 1.5 cm^2. Valve area (Vmean): 1.79 cm^2. - Mitral valve: Calcified annulus. Mobility of the posterior leaflet was restricted. Transvalvular velocity was within the normal range. There was no evidence for stenosis. There was trivial regurgitation. Valve area by pressure half-time: 1.98 cm^2. Valve area by continuity equation (using LVOT flow): 1.94 cm^2. - Left atrium: The atrium was severely dilated. Diameter 44 - Right ventricle: The cavity size was normal. Wall thickness was normal. Systolic function was normal. - Right atrium: The atrium was severely dilated. - Atrial septum: No defect or patent foramen ovale was identified by color flow Doppler. - Tricuspid valve: There was severe regurgitation. - Pulmonary arteries: Systolic pressure was severely increased. PA peak pressure: 81 mm Hg (S). - Pericardium, extracardiac: A trivial  pericardial effusion was identified. Ascites was noted.  CUS pending   PHYSICAL EXAM  Temp:  [98.3 F (36.8 C)-99.7 F (37.6 C)] 98.4 F (36.9 C) (10/11 1436) Pulse Rate:  [46-60] 48 (10/11 1436) Resp:  [12-18] 12 (10/11 1436) BP: (111-204)/(33-70) 204/45 (10/11 1436) SpO2:  [96 %-100 %] 96 % (10/11 1436) Weight:  [119 lb 7.8 oz (54.2 kg)] 119 lb 7.8 oz (54.2 kg) (10/11 0007)  General - Well nourished, well developed, in no apparent distress.  Ophthalmologic - Fundi not visualized due to noncooperation.  Cardiovascular - irregularly irregular heart rate and rhythm.  Mental Status -  Level of arousal and orientation to self and person were intact, but not orientated to age, time or place. Language including expression, repetition, comprehension was assessed and found intact, however with mild perseveration and naming difficulty.  Cranial Nerves II - XII - II - bilateral can only see HW, not able to CF. III, IV, VI - Extraocular movements intact. V - Facial sensation intact bilaterally. VII - Facial movement intact bilaterally. VIII - Hearing & vestibular intact bilaterally. X - Palate elevates symmetrically. XI - Chin turning & shoulder shrug intact bilaterally. XII - Tongue protrusion intact.  Motor Strength - The patient's strength was normal in all extremities and pronator drift was absent.  Bulk was normal and fasciculations were absent.  Motor Tone - Muscle tone was assessed at the neck and appendages and was normal.  Reflexes - The patient's reflexes were 1+ in all extremities and she had no pathological reflexes.  Sensory - Light touch, temperature/pinprick were assessed and were decreased on the LLE    Coordination - The patient had normal movements in the hands with no ataxia or dysmetria.  Tremor was absent.  Gait and Station - not tested due to safety concerns.   ASSESSMENT/PLAN Ms. ALINNE GRAYBILL is a 75 y.o. female with history of ESRD on HD TTS, HTN,  HLD, DM, atrial fibrillation on aspirin presenting with altered mental status that started during HD. She did not receive IV t-PA.   Stroke:  left PCA infarct and right MCA, infarct embolic secondary to known atrial fibrillation not on AC  Resultant  Disorientation, LLE numbness  MRI  L PCA and right MCA infarct  MRA  Not needed as not changing management  Carotid Doppler  pending  2D Echo  EF 55-60%. No source of embolus, however, severe dilated atria bilaterally  LDL 77  HgbA1c 8.0  Heparin 5000 units sq tid for VTE prophylaxis Diet renal/carb modified with fluid restriction Diet-HS Snack? Nothing; Room service appropriate? Yes; Fluid consistency: Thin  aspirin 81 mg daily prior to admission, now on aspirin 325 mg daily. Discussed with family about benefit and risk of AC, they would like to discuss among families. If they option for [facility], would like to hold off for 7 days due to large sized infarcts. OK with ASA bridge.   Patient counseled to be compliant with her antithrombotic medications  Ongoing aggressive stroke risk factor management  Therapy recommendations:  pending   Disposition:  pending   Atrial Fibrillation  Home anticoagulation:  none   CHA2DS2-VASc Score = 5, ?2 oral anticoagulation recommended  Pt family optioned to ASA last admission after right radial artery thrombosis  Discussed with family about benefit and risk of AC, they would like to discuss among families. If they option for [facility], would like to hold off for 7 days due to large sized infarcts. OK with ASA bridge.   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  Hypertension  Stable  Permissive hypertension (OK if < 220/120) but gradually normalize in 5-7 days  Long-term BP goal normotensive  Hyperlipidemia  Home meds:  No statin  LDL 77, goal < 70  Add lipitor 20mg  daily  Continue statin at discharge  Diabetes  HgbA1c  8.0, goal < 7.0  Uncontrolled  SSI  Other Stroke Risk Factors  Advanced age  Diastolic heart failure  Other Active Problems  ESRD on HD TTS  Hypokalemia   Neck pain  Anemia of chronic disease  Hospital day # 1  Rosalin Hawking, MD PhD Stroke Neurology 11/22/2015 4:35 PM  To contact Stroke Continuity provider, please refer to [email or web address] After hours, contact General Neurology

## 2015-11-22 NOTE — Consult Note (Signed)
[facility] Renal Consultation Note    Indication for Consultation:  Management of ESRD/hemodialysis, anemia, hypertension/volume, and secondary hyperparathyroidism. PCP: Kathlene November, MD  HPI: [name] is a 75 y.o. female with ESRD on HD (TTS), HTN, DM, Afib (on aspirin), + cirrhosis (non-alcoholic), Hx bladder lymphoma, and chronic constipation who was admitted with AMS which started during her dialysis session on 10/10; found to have CVA. She speaks spanish only. History obtained through interpreter and family members.   S/p discharge on 11/13/15  for radial artery embolus requiring thromboembolectomy on 11/10/15. She has known Afib, but also cirrhosis. Elected against long-term anticoagulation except aspirin. During dialysis on 11/21/15, she developed acute confusion. Sent to ED via EMS. There heat CT/MRI showed bilateral embolic strokes. Her mental status improved to baseline within a few hours without acute treatment, currently denies any symptoms with exception of headache. Denies CP, dyspnea, nausea, abdominal pain, fever, cough.  Dialyzes TTS at [facility] [place]. Completed most of her dialysis on 10/10. CXR in ED showed vascular congestion/mild interstitial edema.  Past Medical History:  Diagnosis Date  . Atrial fibrillation (HCC)    on ASA  . Bladder cancer [facility]) Jan 10, 2006   BLADDER NECK, BIOPSY: DIFFUSE LARGE B-CELL LYMPHOMA. CT showed worrisome pelvic LN. No treatment records in EPIC.  Mar[facility] Kitchen Blind 2005  . Chronic liver disease    non-alcoholic cirrhosis  . Constipation   . Diabetes mellitus   . ESRD on dialysis [facility])    - Dialysis- [place]  . History of blood transfusion   . Hyperparathyroidism, secondary ([place])   . Hypertension   . Non-Hodgkin lymphoma ([place]) 2007  . Pulmonary HTN   . Radiation    for bladder cancer   Past Surgical History:  Procedure Laterality Date  . AV FISTULA PLACEMENT Right   . AV FISTULA  PLACEMENT Left   . COLONOSCOPY    . FINGER SURGERY Left    Skin graft - skin graft  . INSERTION OF DIALYSIS CATHETER    . 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 THROMBECTOMY BRACHIAL ARTERY Right 11/10/2015   Procedure: RIGHT THROMBECTOMY BRACHIAL ARTERY;  Surgeon: Conrad Bern, MD;  Location: [place];  Service: Vascular;  Laterality: Right;   Family History  Problem Relation Age of Onset  . Thyroid disease Mother   . Lung cancer Father   . Colon cancer Neg Hx   . Breast cancer Neg Hx    Social History:  reports that she has never smoked. She has never used smokeless tobacco. She reports that she does not drink alcohol or use drugs.  ROS: As per HPI otherwise negative.  Physical Exam: Vitals:   11/22/15 0222 11/22/15 0428 11/22/15 0620 11/22/15 0800  BP: (!) 142/36 (!) 147/39 (!) 138/58 (!) 130/33  Pulse: (!) 56 (!) 57 60 (!) 55  Resp: 18 18 16 14   Temp: 99.1 F (37.3 C) 98.8 F (37.1 C) 99.7 F (37.6 C) 99.5 F (37.5 C)  TempSrc: Oral Oral Axillary Oral  SpO2: 100% 97% 98% 99%  Weight:      Height:         General: Well developed, frail female, in no acute distress. Head: Normocephalic, atraumatic, sclera non-icteric, mucus membranes are moist. Visually impaired. Neck: Supple without lymphadenopathy/masses. JVD not  elevated. Lungs: Clear bilaterally to auscultation without wheezes, rales, or rhonchi. Breathing is unlabored. Heart: Irregularly irregular; 2/6 murmur. Abdomen: Soft, non-tender, non-distended with normoactive bowel sounds. No rebound/guarding. No obvious abdominal masses. Musculoskeletal:  Strength and tone appear normal for age. Lower extremities: No LE edema. Neuro: Alert and oriented X 3. Moves all extremities spontaneously. Dialysis Access: LUE AVF + thrill  Allergies   Allergen Reactions  . Roxicet [Oxycodone-Acetaminophen] Other (See Comments)    Hostile, confusion   Prior to Admission medications   Medication Sig Start Date End Date Taking? Authorizing Provider  acetaminophen (TYLENOL) 500 MG tablet Take 500 mg by mouth every 6 (six) hours as needed for headache (pain).    Yes Historical Provider, MD  aspirin EC 81 MG tablet Take 1 tablet (81 mg total) by mouth daily. 11/13/15  Yes Belkys A Regalado, MD  cinacalcet (SENSIPAR) 60 MG tablet Take 60 mg by mouth 2 (two) times daily.   Yes Historical Provider, MD  glipiZIDE (GLUCOTROL) 5 MG tablet Take 5 mg by mouth 2 (two) times daily. 09/18/15  Yes Historical Provider, MD  labetalol (NORMODYNE) 100 MG tablet Take 100 mg by mouth See admin instructions. Take 1 tablet (100 mg) by mouth Sunday, Monday, Wednesday, Friday morning (non-dialysis days), take 1 tablet (100 mg) daily at bedtime   Yes Historical Provider, MD  Multiple Vitamin (MULTIVITAMIN WITH MINERALS) TABS tablet Take 1 tablet by mouth daily.   Yes Historical Provider, MD  Nutritional Supplements (FEEDING SUPPLEMENT, NEPRO CARB STEADY,) LIQD Take 237 mLs by mouth 3 (three) times daily as needed (Supplement). 11/13/15  Yes Belkys A Regalado, MD  omeprazole (PRILOSEC) 20 MG capsule Take 20 mg by mouth daily.  04/08/13  Yes Historical Provider, MD  oxyCODONE-acetaminophen (ROXICET) 5-325 MG tablet Take 1 tablet by mouth every 6 (six) hours as needed for severe pain. 11/13/15  Yes Belkys A Regalado, MD  sevelamer carbonate (RENVELA) 800 MG tablet Take 1,600 mg by mouth 3 (three) times daily before meals.   Yes Historical Provider, MD  sorbitol 70 % solution take 30 milliliters by mouth once daily if needed for constipation 01/12/14  Yes Midge Minium, MD   Current Facility-Administered Medications  Medication Dose Route Frequency Provider Last Rate Last Dose  .  stroke: mapping our early stages of recovery book   Does not apply Once Darrel Reach, MD       .  stroke: mapping our early stages of recovery book   Does not apply Once Norval Morton, MD      . acetaminophen (TYLENOL) tablet 500 mg  500 mg Oral Q6H PRN Norval Morton, MD   500 mg at 11/22/15 0913  . aspirin suppository 300 mg  300 mg Rectal Daily Darrel Reach, MD       Or  . aspirin tablet 325 mg  325 mg Oral Daily Darrel Reach, MD   325 mg at 11/22/15 0913  . cinacalcet (SENSIPAR) tablet 60 mg  60 mg Oral BID WC Norval Morton, MD   60 mg at 11/22/15 0814  . feeding supplement (NEPRO CARB STEADY) liquid 237 mL  237 mL Oral TID PRN Norval Morton, MD      . heparin injection 5,000 Units  5,000 Units Subcutaneous Q8H Norval Morton, MD   5,000 Units at 11/22/15 0531  . insulin aspart (novoLOG) injection 0-9 Units  0-9 Units Subcutaneous TID WC Norval Morton, MD   2 Units at 11/22/15  0814  . multivitamin with minerals tablet 1 tablet  1 tablet Oral Daily Norval Morton, MD   1 tablet at 11/22/15 0913  . pantoprazole sodium (PROTONIX) 40 mg/20 mL oral suspension 40 mg  40 mg Per Tube Daily Shon Millet, DO   40 mg at 11/22/15 1049  . sevelamer carbonate (RENVELA) tablet 1,600 mg  1,600 mg Oral TID WC Norval Morton, MD   1,600 mg at 11/22/15 0814  . sorbitol 70 % solution 30 mL  30 mL Oral Daily PRN Norval Morton, MD      . traMADol Veatrice Bourbon) tablet 50 mg  50 mg Oral Q12H PRN Shon Millet, DO         Recent Labs Lab 11/21/15 1825 11/21/15 1847 11/22/15 0716  NA 136 135 135  K 3.1* 3.3* 3.4*  CL 96* 94* 94*  CO2 27  --  26  GLUCOSE 197* 197* 179*  BUN 15 19 19   CREATININE 3.29* 3.20* 4.17*  CALCIUM 9.3  --  9.7     Recent Labs Lab 11/21/15 1825  AST 30  ALT 13*  ALKPHOS 76  BILITOT 0.9  PROT 7.0  ALBUMIN 3.1*     Recent Labs Lab 11/21/15 1825 11/21/15 1847 11/22/15 0716  WBC 5.2  --  5.3  NEUTROABS 3.2  --   --   HGB 11.0* 12.2 11.2*  HCT 33.8* 36.0 34.4*  MCV 102.7*  --  103.3*  PLT 288  --  269      Recent Labs Lab 11/22/15 0221 11/22/15 0743  GLUCAP 211* 194*   Studies/Results: Dg Chest 2 View  Result Date: 11/21/2015 CLINICAL DATA:  Altered mental status EXAM: CHEST  2 VIEW COMPARISON:  04/08/2012 FINDINGS: The heart is severely enlarged. Lungs are under aerated. Vascular congestion is present. Linear opacities at the lung bases are worrisome for interstitial edema. Small left pleural effusion. IMPRESSION: Findings above are worrisome for mild volume overload. Electronically Signed   By: Marybelle Killings M.D.   On: 11/21/2015 17:50   Ct Head Wo Contrast  Result Date: 11/21/2015 CLINICAL DATA:  Altered mental status.  Atrial fibrillation EXAM: CT HEAD WITHOUT CONTRAST TECHNIQUE: Contiguous axial images were obtained from the base of the skull through the vertex without intravenous contrast. COMPARISON:  Head CT 05/05/2011 FINDINGS: Brain: There is a large region of hypoattenuation with blurring of gray-white differentiation in the posterior left temporal lobe. Additionally, there is an area of hypoattenuation in the mid right temporal lobe with associated loss of gray-white differentiation. Small areas of hyperdensity within the left temporal lobe may indicate petechial hemorrhage. However, there is no intraparenchymal hematoma. No midline shift or mass effect. There is periventricular hypoattenuation compatible with chronic microvascular disease. No extra-axial collection. Vascular: There is calcification within the basilar artery, intracranial internal carotid arteries and right middle cerebral artery. Skull: Normal visualized skull base, calvarium and extracranial soft tissues. Sinuses/Orbits: No sinus fluid levels or advanced mucosal thickening. No mastoid effusion. Normal orbits. IMPRESSION: 1. Acute to early subacute infarcts within the left greater than right temporal lobes. 2. Suspected small amount of petechial hemorrhage within the left temporal ischemic region. No intraparenchymal  hematoma. 3. No midline shift or mass effect. Critical Value/emergent results were called by telephone at the time of interpretation on 11/21/2015 at 8:04 pm to Dr. Fredia Sorrow , who verbally acknowledged these results. Electronically Signed   By: Ulyses Jarred M.D.   On: 11/21/2015 20:04   Mr Brain  Wo Contrast (neuro Protocol)  Result Date: 11/21/2015 CLINICAL DATA:  Stroke.  Dialysis patient. EXAM: MRI HEAD WITHOUT CONTRAST TECHNIQUE: Multiplanar, multiecho pulse sequences of the brain and surrounding structures were obtained without intravenous contrast. COMPARISON:  CT head 11/21/2015 FINDINGS: Patient was not able to complete the study. The patient began moving after the diffusion sequences and did not complete further sequences after axial FLAIR imaging. Acute infarct in the left medial temporal lobe in the left PCA territory. This corresponds to the low-density area on CT. Gradient echo imaging not performed on MRI but no definite hemorrhage on the diffusion-weighted imaging. Acute infarct in the right posterior lateral temporal lobe and high right parietal lobe in the right MCA territory. Generalized atrophy.  Negative for hydrocephalus. IMPRESSION: Acute infarct left medial temporal lobe in the left PCA territory. Acute infarct in the right temporoparietal lobe in the right MCA territory. Findings suggest emboli. Incomplete study due to lack of patient cooperation. Electronically Signed   By: Franchot Gallo M.D.   On: 11/21/2015 21:53    Dialysis Orders:  Center: TTS at [facility] 3:15 hours, EDW 54kg, BFR400/DFR A 1.5, 2K/2Ca bath - No heparin - Mircera 43mcg IV q 2 weeks (last given 11/14/15)  Assessment/Plan: 1.  CVA, embolic: Neuro following. Fortunately, she is nearly back to baseline mentally. Primary team is discussing anticoagulation. 2.  ESRD:  Continue TTS schedule, next HD 10/12. 3.  Hypertension/volume: BP fine. CXR with concern for mild pulm edema, but she is breathing  comfortably. Will try to increase UF with next HD if BP allows.  4.  Anemia: Hgb 11.2. Not time for ESA yet. 5.  Metabolic bone disease: Ca 9.7. Continue binders/sensipar. Not on VDRA. 6.  Nutrition: Albumin 3.1. Start Nepro supplements. 7. Atrial Fibrillation: Per primary. On ASA alone at this time.  Veneta Penton, PA-C 11/22/2015, 11:20 AM  [facility] Pager: [phone]  I have seen and examined this patient and agree with plan and assessment in the above note with renal recommendations/intervention highlighted. Pt has now had at least 2 (if not more) embolic events  (radial artery and CNS) in the present of AF with no anticoagulation.  She is high bleeding risk with ESRD and coumadin management even if pt/family agreeable (which they have not been to date) would be very difficult (language barrier, could not be managed at the dialysis unit, would require a LOT of family cooperation to take her for PT-INR's and dose adjustments).  Although clearly not a usual first option would consider a low dose of Eliquis. She will have HD tomorrow if she is agreeable.   Janee Ureste B,MD 11/22/2015 4:21 PM

## 2015-11-22 NOTE — Progress Notes (Signed)
NIH completed with translator at bedside

## 2015-11-23 ENCOUNTER — Inpatient Hospital Stay (HOSPITAL_COMMUNITY): Payer: Medicare Other

## 2015-11-23 DIAGNOSIS — I63411 Cerebral infarction due to embolism of right middle cerebral artery: Principal | ICD-10-CM

## 2015-11-23 DIAGNOSIS — I771 Stricture of artery: Secondary | ICD-10-CM

## 2015-11-23 DIAGNOSIS — I639 Cerebral infarction, unspecified: Secondary | ICD-10-CM

## 2015-11-23 LAB — CBC WITH DIFFERENTIAL/PLATELET
BASOS ABS: 0.1 10*3/uL (ref 0.0–0.1)
Basophils Relative: 2 %
EOS PCT: 2 %
Eosinophils Absolute: 0.1 10*3/uL (ref 0.0–0.7)
HEMATOCRIT: 32.7 % — AB (ref 36.0–46.0)
Hemoglobin: 10.7 g/dL — ABNORMAL LOW (ref 12.0–15.0)
LYMPHS ABS: 1.7 10*3/uL (ref 0.7–4.0)
LYMPHS PCT: 37 %
MCH: 33.9 pg (ref 26.0–34.0)
MCHC: 32.7 g/dL (ref 30.0–36.0)
MCV: 103.5 fL — AB (ref 78.0–100.0)
MONO ABS: 0.5 10*3/uL (ref 0.1–1.0)
Monocytes Relative: 10 %
NEUTROS ABS: 2.3 10*3/uL (ref 1.7–7.7)
Neutrophils Relative %: 49 %
PLATELETS: 271 10*3/uL (ref 150–400)
RBC: 3.16 MIL/uL — AB (ref 3.87–5.11)
RDW: 15.3 % (ref 11.5–15.5)
WBC: 4.5 10*3/uL (ref 4.0–10.5)

## 2015-11-23 LAB — VAS US CAROTID
LCCADSYS: 92 cm/s
LCCAPDIAS: 14 cm/s
LEFT ECA DIAS: -15 cm/s
LEFT VERTEBRAL DIAS: 12 cm/s
Left CCA dist dias: 13 cm/s
Left CCA prox sys: 136 cm/s
Left ICA dist dias: -17 cm/s
Left ICA dist sys: -92 cm/s
Left ICA prox dias: -12 cm/s
Left ICA prox sys: -57 cm/s
RCCADSYS: -81 cm/s
RCCAPDIAS: 11 cm/s
RCCAPSYS: 68 cm/s
RIGHT ECA DIAS: -1 cm/s
RIGHT VERTEBRAL DIAS: 9 cm/s

## 2015-11-23 LAB — GLUCOSE, CAPILLARY
Glucose-Capillary: 162 mg/dL — ABNORMAL HIGH (ref 65–99)
Glucose-Capillary: 164 mg/dL — ABNORMAL HIGH (ref 65–99)

## 2015-11-23 LAB — BASIC METABOLIC PANEL
ANION GAP: 14 (ref 5–15)
BUN: 25 mg/dL — AB (ref 6–20)
CO2: 24 mmol/L (ref 22–32)
Calcium: 9.4 mg/dL (ref 8.9–10.3)
Chloride: 97 mmol/L — ABNORMAL LOW (ref 101–111)
Creatinine, Ser: 5.47 mg/dL — ABNORMAL HIGH (ref 0.44–1.00)
GFR calc Af Amer: 8 mL/min — ABNORMAL LOW (ref >60)
GFR, EST NON AFRICAN AMERICAN: 7 mL/min — AB (ref >60)
GLUCOSE: 127 mg/dL — AB (ref 65–99)
POTASSIUM: 3.7 mmol/L (ref 3.5–5.1)
Sodium: 135 mmol/L (ref 135–145)

## 2015-11-23 LAB — HEMOGLOBIN A1C
Hgb A1c MFr Bld: 7.4 % — ABNORMAL HIGH (ref 4.8–5.6)
MEAN PLASMA GLUCOSE: 166 mg/dL

## 2015-11-23 MED ORDER — ASPIRIN 325 MG PO TBEC: 325.0000 mg | DELAYED_RELEASE_TABLET | Freq: Every day | ORAL | 0 refills | Status: AC

## 2015-11-23 MED ORDER — ATORVASTATIN CALCIUM 20 MG PO TABS: 20.0000 mg | ORAL_TABLET | Freq: Every day | ORAL | 0 refills | Status: AC

## 2015-11-23 NOTE — Discharge Summary (Addendum)
Physician Discharge Summary  [name] [id number] DOB: [birth year] DOA: 11/21/2015  PCP: Kathlene November, MD  Admit date: 11/21/2015 Discharge date: 11/23/2015  Admitted From: Home Disposition:  Home   Recommendations for Outpatient Follow-up:  1. Follow up with PCP in 1-2 weeks 2. Follow up with Neurology in 6 weeks   3. Follow up with Dr. 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  4. Please follow up on the following pending results: final carotid ultrasound result. Preliminary findings in chart.  5. You refused inpatient dialysis. You must make an appointment with your outpatient dialysis center for dialysis after discharge.  6. Limit pain medications including tylenol, ibuprofen, NSAIDs, narcotics/opiates.   Home Health: home health PT  Equipment/Devices: none   Discharge Condition: Stable, but guarded due to high risk of recurrent stroke, not on anticoagulation.  CODE STATUS: Full  Diet recommendation: Renal    Brief/Interim Summary: Valerie Gajjar Ramirezis a 75 y.o. spanish speaking only femalewith medical history significant of ESRD on HD (T/TH/Sat), HTN, HLD, DM, A.fib on ASA; who presented with complaints of altered mental status while receiving hemodialysis. Patient was initially confused and did not recall who family members are,but after being in the emergency room for some time was then able to recognize family members. Associated symptoms include generalized weakness and complaints of neck discomfort from laying in the hospital gurney. Recently hospitalized from 9/29-10/2 at [facility] where she underwent a right radial artery embolectomy by Dr. Bridgett Larsson. It was discussed with her the need to be placed on anticoagulation at that time, but family notes there was concerned with her history of liver cirrhosis.  During admission, CT head showed acute infarcts within the left greater than right temporal lobe. MRI brain was completed which  showed "acute infarct left medial temporal lobe in the left PCA territory. Acute infarct in the right temporoparietal lobe in the right MCA territory. Findings suggest emboli." Neurology and stroke team were consulted. Patient was continued on aspirin. Blood pressure was remained elevated to allow for permissive hypertension in acute phase illness. Patient has chronic atrial fibrillation with chadsvasc score of 5. Extensive discussion with multiple family members were had regarding the risks and benefits of starting anticoagulation. We discussed risks of bleeding, as well as risk of recurrent embolic stroke and benefit of preventing such episodes. Patient has underlying cirrhosis of unknown etiology per family members (NASH?), as well as history of recurrent falls. At this time, patient and family have decided to not start anticoagulation. They would like to continue aspirin for now and understand risks not starting blood thinning agents at this time. Throughout her hospitalization, patient worked with physical therapy. They recommend home physical therapy. Her acute encephalopathy continued to improve throughout her hospitalization. She did complain of neck pain which was present prior to admission, but due to her cirrhosis as well as kidney failure and acute encephalopathy on admission, it was difficult to manage pain control. I did encourage family to limit Tylenol use as well.  Prior to discharge, patient and family refused inpatient dialysis. Patient and family were adamant on having outpatient dialysis as this is where she is most comfortable. We did discuss possibility of not getting a spot at outpatient dialysis, but patient and family continue to refuse inpatient dialysis even with extensive discussion. I did discuss with family that it was imperative on an appointment slot at outpatient dialysis prior to the weekend.  Discharge Diagnoses:  Principal Problem:   CVA (cerebral  vascular accident)  [facility]) Active Problems:   ESRD (end stage renal disease) on dialysis ([place])   Diabetes mellitus type II, non insulin dependent ([place])   Atrial fibrillation ([name])   Acute encephalopathy   Neck pain   Hypokalemia   Pure hypercholesterolemia  CVA, left PCA and right MCA, suggestive of embolic etiology  - CT head showed acute infarcts within L > R temporal lobes. Suspect small amt petechial hemorrhage within left temporal ischemic region without intraparenchymal hematoma.  - MRI brain showed acute infarct left medial temporal lobe in the left PCA territory. Acute infarct in the right temporoparietal lobe in the right MCA territory. Findings suggest emboli  - Neurology following - PT/OT/speech eval and treat  - Continue aspirin - Allow for permissive hypertension in acute phase illness  - Carotid US prelim report: "Findings suggest 1-39% internal carotid artery stenosis bilaterally. Vertebral arteries are patent with antegrade flow. The left subclavian artery exhibits elevated velocities (454/105) suggestive of hemodynamically significant stenosis." Patient should follow up with vascular surgery regarding this finding. She has previously seen Dr. Bridgett Larsson for right radial artery endarterectomy   Atrial fibrillation: Chronic.  - CHA2DS2-VASc score =5. Apparently discussed with family at last hospitalization risks and benefits of starting anticoagulation. Family and patient decided to continue aspirin and deferred anticoagulation at that time as patient has underlying cirrhosis and hx frequent falls.  - Continue aspirin for now   - Had a long discussion with family throughout hospitalization. Currently, family would favor NOT starting anticoagulation due to risk of bleed, even though the alternative would increase risk of another stroke.  Acute encephalopathy - Resolved, likely secondary to CVA  - Continue to monitor - Limiting sedating medications, benzo, opiates, anticholinergics   Chronic  diastolic heart failure - Patient's last EF 11/13/15 showed ejection fraction of 55-60% with grade 3 diastolic dysfunction. Patient was noted to have significant bilateral atrial dilation.  Neck pain - Supportive care, limit tylenol, nsaids, narcotics due to cirrhosis, ESRD, acute encephalopathy   ESRD on HD(T/TH/Sat) - Nephro/HD following  - Continue Sensipar and Renvela  Hypokalemia potassium initially 3.1 on admission. - Replaced at time of admission    Diabetes mellitus type 2, uncontrolled, with complication including ESRD and CAD - Ha1c 7.4  -Hold glipizide. Would reconsider this medication in elderly patient, high risk of fall.  - CBGs every before meals with sensitive sliding scale insulin  Anemia of chronic disease -Stable   Essentialhypertension  -Hold Labetalol to allow permissive HTN   Discharge Instructions  Discharge Instructions    Discharge instructions    Complete by:  As directed    -Follow up with PCP in 1-2 weeks -Follow up with Neurology in 6 weeks   -Follow up with Dr. 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  -Please follow up on the following pending results: final carotid ultrasound result. Preliminary findings in chart.  -You refused inpatient dialysis. You must make an appointment with your outpatient dialysis center for dialysis after discharge. -Limit pain medications including tylenol, ibuprofen, NSAIDs, narcotics/opiates.   Increase activity slowly    Complete by:  As directed        Medication List    STOP taking these medications   acetaminophen 500 MG tablet Commonly known as:  TYLENOL   oxyCODONE-acetaminophen 5-325 MG tablet Commonly known as:  ROXICET     TAKE these medications   aspirin 325 MG EC tablet Take 1 tablet (325 mg total) by mouth  daily. What changed:  medication strength  how much to take   atorvastatin 20 MG tablet Commonly known as:  LIPITOR Take  1 tablet (20 mg total) by mouth daily.   cinacalcet 60 MG tablet Commonly known as:  SENSIPAR Take 60 mg by mouth 2 (two) times daily.   feeding supplement (NEPRO CARB STEADY) Liqd Take 237 mLs by mouth 3 (three) times daily as needed (Supplement).   glipiZIDE 5 MG tablet Commonly known as:  GLUCOTROL Take 5 mg by mouth 2 (two) times daily.   labetalol 100 MG tablet Commonly known as:  NORMODYNE Take 100 mg by mouth See admin instructions. Take 1 tablet (100 mg) by mouth Sunday, Monday, Wednesday, Friday morning (non-dialysis days), take 1 tablet (100 mg) daily at bedtime   multivitamin with minerals Tabs tablet Take 1 tablet by mouth daily.   omeprazole 20 MG capsule Commonly known as:  PRILOSEC Take 20 mg by mouth daily.   sevelamer carbonate 800 MG tablet Commonly known as:  RENVELA Take 1,600 mg by mouth 3 (three) times daily before meals.   sorbitol 70 % solution take 30 milliliters by mouth once daily if needed for constipation      Follow-up Stark, MD. Schedule an appointment as soon as possible for a visit in 1 week(s).   Specialty:  Internal Medicine Contact information: [address] [place] [address] [phone]        Xu,Jindong, MD. Schedule an appointment as soon as possible for a visit in 6 week(s).   Specialty:  Neurology Contact information: [address] [place] [address] [phone]        Adele Barthel, MD. Schedule an appointment as soon as possible for a visit in 1 week(s).   Specialties:  Vascular Surgery, Cardiology Contact information: [address] [place] [address] [phone]          Allergies  Allergen Reactions  . Roxicet [Oxycodone-Acetaminophen] Other (See Comments)    Hostile, confusion    Consultations:  Neurology    Procedures/Studies: Dg Chest 2 View  Result Date: 11/21/2015 CLINICAL DATA:  Altered mental status EXAM: CHEST  2 VIEW COMPARISON:   04/08/2012 FINDINGS: The heart is severely enlarged. Lungs are under aerated. Vascular congestion is present. Linear opacities at the lung bases are worrisome for interstitial edema. Small left pleural effusion. IMPRESSION: Findings above are worrisome for mild volume overload. Electronically Signed   By: Marybelle Killings M.D.   On: 11/21/2015 17:50   Ct Head Wo Contrast  Result Date: 11/21/2015 CLINICAL DATA:  Altered mental status.  Atrial fibrillation EXAM: CT HEAD WITHOUT CONTRAST TECHNIQUE: Contiguous axial images were obtained from the base of the skull through the vertex without intravenous contrast. COMPARISON:  Head CT 05/05/2011 FINDINGS: Brain: There is a large region of hypoattenuation with blurring of gray-white differentiation in the posterior left temporal lobe. Additionally, there is an area of hypoattenuation in the mid right temporal lobe with associated loss of gray-white differentiation. Small areas of hyperdensity within the left temporal lobe may indicate petechial hemorrhage. However, there is no intraparenchymal hematoma. No midline shift or mass effect. There is periventricular hypoattenuation compatible with chronic microvascular disease. No extra-axial collection. Vascular: There is calcification within the basilar artery, intracranial internal carotid arteries and right middle cerebral artery. Skull: Normal visualized skull base, calvarium and extracranial soft tissues. Sinuses/Orbits: No sinus fluid levels or advanced mucosal thickening. No mastoid effusion. Normal orbits. IMPRESSION:  1. Acute to early subacute infarcts within the left greater than right temporal lobes. 2. Suspected small amount of petechial hemorrhage within the left temporal ischemic region. No intraparenchymal hematoma. 3. No midline shift or mass effect. Critical Value/emergent results were called by telephone at the time of interpretation on 11/21/2015 at 8:04 pm to Dr. Fredia Sorrow , who verbally acknowledged  these results. Electronically Signed   By: Ulyses Jarred M.D.   On: 11/21/2015 20:04   Mr Brain Wo Contrast (neuro Protocol)  Result Date: 11/21/2015 CLINICAL DATA:  Stroke.  Dialysis patient. EXAM: MRI HEAD WITHOUT CONTRAST TECHNIQUE: Multiplanar, multiecho pulse sequences of the brain and surrounding structures were obtained without intravenous contrast. COMPARISON:  CT head 11/21/2015 FINDINGS: Patient was not able to complete the study. The patient began moving after the diffusion sequences and did not complete further sequences after axial FLAIR imaging. Acute infarct in the left medial temporal lobe in the left PCA territory. This corresponds to the low-density area on CT. Gradient echo imaging not performed on MRI but no definite hemorrhage on the diffusion-weighted imaging. Acute infarct in the right posterior lateral temporal lobe and high right parietal lobe in the right MCA territory. Generalized atrophy.  Negative for hydrocephalus. IMPRESSION: Acute infarct left medial temporal lobe in the left PCA territory. Acute infarct in the right temporoparietal lobe in the right MCA territory. Findings suggest emboli. Incomplete study due to lack of patient cooperation. Electronically Signed   By: Franchot Gallo M.D.   On: 11/21/2015 21:53    Carotid US, prelim: Findings suggest 1-39% internal carotid artery stenosis bilaterally. Vertebral arteries are patent with antegrade flow. The left subclavian artery exhibits elevated velocities (454/105) suggestive of hemodynamically significant stenosis.   Subjective: Patient feeling well this morning. No complaints. Eating breakfast. Would like to go home. Son states that patient's mentation has improved since admission.   Discharge Exam: Vitals:   11/23/15 0507 11/23/15 0914  BP: (!) 141/30 (!) 136/54  Pulse: (!) 58 (!) 53  Resp: 17 16  Temp: 98.4 F (36.9 C) 98 F (36.7 C)   Vitals:   11/22/15 2051 11/23/15 0149 11/23/15 0507 11/23/15 0914   BP: (!) 155/41 (!) 145/39 (!) 141/30 (!) 136/54  Pulse: (!) 58 60 (!) 58 (!) 53  Resp: 16 16 17 16   Temp: 98.5 F (36.9 C) 98.3 F (36.8 C) 98.4 F (36.9 C) 98 F (36.7 C)  TempSrc: Axillary Axillary Axillary Oral  SpO2: 99% 97% 98% 100%  Weight:      Height: 5\' 5"  (1.651 m)       General: Pt is alert, awake, not in acute distress Cardiovascular: RRR, S1/S2 +, no rubs, no gallops Respiratory: CTA bilaterally, no wheezing, no rhonchi Abdominal: Soft, NT, ND, bowel sounds + Extremities: no edema, no cyanosis Neuro: No focal deficits. Strength 5/5 all extremities.     The results of significant diagnostics from this hospitalization (including imaging, microbiology, ancillary and laboratory) are listed below for reference.     Microbiology: Recent Results (from the past 240 hour(s))  MRSA PCR Screening     Status: None   Collection Time: 11/22/15  5:45 AM  Result Value Ref Range Status   MRSA by PCR NEGATIVE NEGATIVE Final    Comment:        The GeneXpert MRSA Assay (FDA approved for NASAL specimens only), is one component of a comprehensive MRSA colonization surveillance program. It is not intended to diagnose MRSA infection nor to guide or monitor  treatment for MRSA infections.      Labs: BNP (last 3 results) No results for input(s): BNP in the last 8760 hours. Basic Metabolic Panel:  Recent Labs Lab 11/21/15 1825 11/21/15 1847 11/22/15 0716 11/23/15 0445  NA 136 135 135 135  K 3.1* 3.3* 3.4* 3.7  CL 96* 94* 94* 97*  CO2 27  --  26 24  GLUCOSE 197* 197* 179* 127*  BUN 15 19 19  25*  CREATININE 3.29* 3.20* 4.17* 5.47*  CALCIUM 9.3  --  9.7 9.4   Liver Function Tests:  Recent Labs Lab 11/21/15 1825  AST 30  ALT 13*  ALKPHOS 76  BILITOT 0.9  PROT 7.0  ALBUMIN 3.1*   No results for input(s): LIPASE, AMYLASE in the last 168 hours. No results for input(s): AMMONIA in the last 168 hours. CBC:  Recent Labs Lab 11/21/15 1825 11/21/15 1847  11/22/15 0716 11/23/15 0445  WBC 5.2  --  5.3 4.5  NEUTROABS 3.2  --   --  2.3  HGB 11.0* 12.2 11.2* 10.7*  HCT 33.8* 36.0 34.4* 32.7*  MCV 102.7*  --  103.3* 103.5*  PLT 288  --  269 271   Cardiac Enzymes: No results for input(s): CKTOTAL, CKMB, CKMBINDEX, TROPONINI in the last 168 hours. BNP: Invalid input(s): POCBNP CBG:  Recent Labs Lab 11/22/15 1156 11/22/15 1654 11/22/15 2154 11/23/15 0748 11/23/15 1219  GLUCAP 100* 113* 127* 162* 164*   D-Dimer No results for input(s): DDIMER in the last 72 hours. Hgb A1c  Recent Labs  11/22/15 0418  HGBA1C 7.4*   Lipid Profile  Recent Labs  11/22/15 0418  CHOL 127  HDL 24*  LDLCALC 77  TRIG 129  CHOLHDL 5.3   Thyroid function studies No results for input(s): TSH, T4TOTAL, T3FREE, THYROIDAB in the last 72 hours.  Invalid input(s): FREET3 Anemia work up No results for input(s): VITAMINB12, FOLATE, FERRITIN, TIBC, IRON, RETICCTPCT in the last 72 hours. Urinalysis No results found for: COLORURINE, APPEARANCEUR, [place], [place], [place], [place], [place], [place], PROTEINUR, UROBILINOGEN, NITRITE, LEUKOCYTESUR Sepsis Labs Invalid input(s): PROCALCITONIN,  WBC,  LACTICIDVEN Microbiology Recent Results (from the past 240 hour(s))  MRSA PCR Screening     Status: None   Collection Time: 11/22/15  5:45 AM  Result Value Ref Range Status   MRSA by PCR NEGATIVE NEGATIVE Final    Comment:        The GeneXpert MRSA Assay (FDA approved for NASAL specimens only), is one component of a comprehensive MRSA colonization surveillance program. It is not intended to diagnose MRSA infection nor to guide or monitor treatment for MRSA infections.      Time coordinating discharge: Over 30 minutes  SIGNED:  Dessa Phi, DO [facility] Pager [phone]  If 7PM-7AM, please contact night-coverage [email or web address] Password [email or web address] 11/23/2015, 3:21 PM

## 2015-11-23 NOTE — Progress Notes (Signed)
*  PRELIMINARY RESULTS* Vascular Ultrasound Carotid Duplex (Doppler) has been completed.  Findings suggest 1-39% internal carotid artery stenosis bilaterally. Vertebral arteries are patent with antegrade flow. The left subclavian artery exhibits elevated velocities (454/105) suggestive of hemodynamically significant stenosis.  11/23/2015 8:55 AM Maudry Mayhew, BS, RVT, RDCS, RDMS

## 2015-11-23 NOTE — Progress Notes (Signed)
Reviewed discharge instructions and medications with patient and her family. All questions answered. Printed prescriptions given to patient's family.

## 2015-11-23 NOTE — Care Management Note (Signed)
Case Management Note  Patient Details  Name: [name] MRN: [id number] Date of Birth: 09-22-1940  Subjective/Objective:      CM following for progression and d/c planning.               Action/Plan: 11/23/2015 Met with pt and family , discussed d/c needs with pt son who is [place] speaking. He is requesting [facility] services. HHPT and OT as well as speech recommanded per evals. HHRN for eval and assessment and HH aide for personal care services. These visits were explained to son and [facility] was notified of pt needs, referral made. 3:1 commode ordered and [facility] notified of plan to d/c home today.   Expected Discharge Date:  11/23/2015              Expected Discharge Plan:  [place]  In-House Referral:  NA  Discharge planning Services  CM Consult  Post Acute Care Choice:  Home Health, Durable Medical Equipment Choice offered to:  Adult Children  DME Arranged:  3-N-1 DME Agency:  [place] Arranged:  RN, PT, OT, Nurse's Aide, Speech Therapy, Social Work [employer] Agency:  [place]  Status of Service:  Completed, signed off  If discussed at [employer], dates discussed:    Additional Comments:  Adron Bene, RN 11/23/2015, 1:08 PM

## 2015-11-23 NOTE — Progress Notes (Signed)
[facility] Brief Progress Note  Beginning at 6:15 this AM numerous attempts have been made to get pt into the dialysis unit for treatment. Initially wanted to "get washed up" Retried to get pt but they were waiting for family member to arrive Family arrived and said "patient was going home" and that they would take her to HD when she got home (no discharge orders that I see) Then again tried around and they refused unless she could only HD for 2 hours (her prescribed time is 3 hours 15 minutes) After that they refused altogether.  Close to 2 hours worth of attempts have been made to get patient to come into HD.  If she changes her mind and is not discharged please let us know - she would have to be fitted in sometime later.  Jamal Maes, MD [facility] [phone] Pager 11/23/2015, 7:53 AM

## 2015-11-23 NOTE — Progress Notes (Signed)
This RN has educated patient/family on importance of dialysis treatment; patient's family is refusing treatment this morning stating that three hours is too long for her to handle. Hemodialysis and MD notified.

## 2015-11-23 NOTE — Progress Notes (Signed)
Physical Therapy Treatment Patient Details Name: FUMIKO ECKERSLEY MRN: [id number] DOB: 02/12/1940 Today's Date: 11/23/2015    History of Present Illness Pt admitted with AMS, L medial temporal lobe and R parietal lobe infarcts. Pt with recent admission for removal of blood clot in R UE. PMH: ESRD, HTN, HLD, DM, afib.    PT Comments    Patient making good progress with mobility and gait.  Follow Up Recommendations  Home health PT;Supervision/Assistance - 24 hour     Equipment Recommendations  3in1 (PT)    Recommendations for Other Services       Precautions / Restrictions Precautions Precautions: Fall Precaution Comments: pt with low vision Restrictions Weight Bearing Restrictions: No    Mobility  Bed Mobility Overal bed mobility: Needs Assistance Bed Mobility: Supine to Sit     Supine to sit: Min assist     General bed mobility comments: Assist to raise trunk to upright sitting position.  Good sitting balance at EOB.  Transfers Overall transfer level: Needs assistance Equipment used: 2 person hand held assist Transfers: Sit to/from Stand Sit to Stand: Min assist         General transfer comment: Assist to initiate transfer and for safety - patient fearful.  Ambulation/Gait Ambulation/Gait assistance: Min assist;+2 safety/equipment Ambulation Distance (Feet): 140 Feet Assistive device: 2 person hand held assist Gait Pattern/deviations: Step-through pattern;Decreased stride length     General Gait Details: Patient fearful due to low vision.  Minimal hand hold assist to negotiate room/hallway.  Patient with fairly good balance during gait.   Stairs            Wheelchair Mobility    Modified Rankin (Stroke Patients Only) Modified Rankin (Stroke Patients Only) Pre-Morbid Rankin Score: Slight disability Modified Rankin: Moderate disability     Balance Overall balance assessment: Needs assistance         Standing balance support: No upper  extremity supported Standing balance-Leahy Scale: Fair Standing balance comment: Good balance with static stance.  Assist with gait/dynamic activities.                    Cognition Arousal/Alertness: Awake/alert Behavior During Therapy: Flat affect           Following Commands: Follows one step commands consistently (with visual and tactile cues)     Problem Solving: Slow processing;Difficulty sequencing;Requires verbal cues;Requires tactile cues;Decreased initiation      Exercises      General Comments        Pertinent Vitals/Pain Pain Assessment: No/denies pain    Home Living     Available Help at Discharge: (P) Family;Available 24 hours/day Type of Home: (P) Apartment              Prior Function            PT Goals (current goals can now be found in the care plan section) Progress towards PT goals: Progressing toward goals    Frequency    Min 4X/week      PT Plan Current plan remains appropriate    Co-evaluation             End of Session Equipment Utilized During Treatment: Gait belt Activity Tolerance: Patient tolerated treatment well Patient left: in bed;with call bell/phone within reach;with family/visitor present (sitting EOB)     Time: [id number] PT Time Calculation (min) (ACUTE ONLY): 9 min  Charges:  $Gait Training: 8-22 mins  G Codes:      Despina Pole 11/23/2015, 11:56 AM Carita Pian. Sanjuana Kava, [place] Pager [phone]

## 2015-11-23 NOTE — Evaluation (Signed)
Speech Language Pathology Evaluation Patient Details Name: [name] MRN: [id number] DOB: 1940/04/20 Today's Date: 11/23/2015 Time: 1034-1100 SLP Time Calculation (min) (ACUTE ONLY): 26 min  Problem List:  Patient Active Problem List   Diagnosis Date Noted  . Acute encephalopathy 11/22/2015  . Neck pain 11/22/2015  . Hypokalemia 11/22/2015  . Pure hypercholesterolemia   . CVA (cerebral vascular accident) ([place]) 11/21/2015  . Thromboembolism ([place]) 11/10/2015  . Cardiac murmur 11/10/2015  . Constipation 01/27/2014  . Atrial fibrillation ([place]) 11/22/2013  . Cirrhosis, non-alcoholic ([place]) [id number]  . Left inguinal hernia 03/24/2013  . Axillary lymphadenopathy 03/12/2013  . Abnormal EKG 04/08/2012  . Hypercalcemia associated with chronic dialysis 04/08/2012  . Non-Hodgkin's lymphoma of extranodal site ([place]) 10/16/2011  . ESRD (end stage renal disease) on dialysis ([place]) 05/05/2011  . Dizziness 05/05/2011  . Diabetes mellitus type II, non insulin dependent ([place]) 05/05/2011  . Hypertension 05/05/2011   Past Medical History:  Past Medical History:  Diagnosis Date  . Atrial fibrillation (HCC)    on ASA  . Bladder cancer [facility]) Jan 10, 2006   BLADDER NECK, BIOPSY: DIFFUSE LARGE B-CELL LYMPHOMA. CT showed worrisome pelvic LN. No treatment records in EPIC.  Mar[place] Kitchen Blind 2005  . Chronic liver disease    non-alcoholic cirrhosis  . Constipation   . Diabetes mellitus   . ESRD on dialysis [facility])    - Dialysis- [place]  . History of blood transfusion   . Hyperparathyroidism, secondary ([place])   . Hypertension   . Non-Hodgkin lymphoma ([place]) 2007  . Pulmonary HTN   . Radiation    for bladder cancer   Past Surgical History:  Past Surgical History:  Procedure Laterality Date  . AV FISTULA PLACEMENT Right   . AV FISTULA PLACEMENT Left   . COLONOSCOPY    . FINGER SURGERY Left    Skin graft - skin graft  . INSERTION OF DIALYSIS CATHETER    . RESECTION OF  ARTERIOVENOUS FISTULA ANEURYSM Left 09/20/2015   Procedure: RESECTION and revision OF ARTERIOVENOUS FISTULA ANEURYSM;  Surgeon: Serafina Mitchell, MD;  Location: [place];  Service: Vascular;  Laterality: Left;  . THROMBECTOMY BRACHIAL ARTERY Right 11/10/2015   Procedure: RIGHT THROMBECTOMY BRACHIAL ARTERY;  Surgeon: Conrad Wilsey, MD;  Location: [facility] OR;  Service: Vascular;  Laterality: Right;   HPI:  Pt is a 75 year old [place] speaking female admitted for mental status change during dialysis. Found to have acute CVA, CT shows hypodensity involving the medial aspect of the left temporal lobe extending back into the left parieto-occipital junction. A second area of well-defined hypodensity is noted in the posterior aspect of the right temporal lobe.   Assessment / Plan / Recommendation Clinical Impression  Pt demonstrates primary memory deficits that impacts awareness of deficits and confrontational naming tasks, particularly with proper names. Given otherwise fluent speech and language, suspect this is more of a memory issue than a language issue. Pt improves with multiple repetitions of new information with fading initial sound cues for recall of names. Pt would benefit from ongoing acute f/u and f/u with home health SLP after d/c to facilitate memory in a familiar setting. Family is in agreement.     SLP Assessment  Patient needs continued Speech Lanaguage Pathology Services    Follow Up Recommendations  24 hour supervision/assistance;Home health SLP    Frequency and Duration min 2x/week  2 weeks      SLP Evaluation Cognition  Arousal/Alertness: Awake/alert Orientation Level:  Oriented to person;Disoriented to place;Disoriented to time;Disoriented to situation Attention: Focused;Sustained;Selective Focused Attention: Appears intact Sustained Attention: Appears intact Selective Attention: Appears intact Memory: Impaired Memory Impairment: Storage deficit;Retrieval deficit;Decreased long term  memory;Decreased short term memory Decreased Long Term Memory: Verbal basic Decreased Short Term Memory: Verbal basic Awareness: Impaired Awareness Impairment: Intellectual impairment Problem Solving: Appears intact       Comprehension  Auditory Comprehension Overall Auditory Comprehension: Appears within functional limits for tasks assessed    Expression Verbal Expression Overall Verbal Expression: Impaired Initiation: No impairment Automatic Speech: Name;Counting;Day of week Level of Generative/Spontaneous Verbalization: Conversation Repetition: No impairment Naming: Impairment Responsive: 51-75% accurate Confrontation: Impaired Convergent: Not tested Divergent: 50-74% accurate Verbal Errors: Aware of errors Pragmatics: No impairment Effective Techniques: Phonemic cues   Oral / Motor  Oral Motor/Sensory Function Overall Oral Motor/Sensory Function: Within functional limits Motor Speech Overall Motor Speech: Appears within functional limits for tasks assessed   GO                   Herbie Baltimore, MA CCC-SLP [phone]  Lynann Beaver 11/23/2015, 12:01 PM

## 2015-11-23 NOTE — Progress Notes (Signed)
STROKE TEAM PROGRESS NOTE   SUBJECTIVE (INTERVAL HISTORY) Grandson and other family memebers at bedside. Grandson speaks english. Pt is sitting in chair ready for discharge. [name] has questions about bedside commode.     OBJECTIVE Temp:  [98 F (36.7 C)-98.4 F (36.9 C)] 98 F (36.7 C) (10/12 0914) Pulse Rate:  [53-60] 53 (10/12 0914) Cardiac Rhythm: Atrial fibrillation (10/12 0700) Resp:  [16-17] 16 (10/12 0914) BP: (136-145)/(30-54) 136/54 (10/12 0914) SpO2:  [97 %-100 %] 100 % (10/12 0914)  CBC:   Recent Labs Lab 11/21/15 1825  11/22/15 0716 11/23/15 0445  WBC 5.2  --  5.3 4.5  NEUTROABS 3.2  --   --  2.3  HGB 11.0*  < > 11.2* 10.7*  HCT 33.8*  < > 34.4* 32.7*  MCV 102.7*  --  103.3* 103.5*  PLT 288  --  269 271  < > = values in this interval not displayed.  Basic Metabolic Panel:   Recent Labs Lab 11/22/15 0716 11/23/15 0445  NA 135 135  K 3.4* 3.7  CL 94* 97*  CO2 26 24  GLUCOSE 179* 127*  BUN 19 25*  CREATININE 4.17* 5.47*  CALCIUM 9.7 9.4    Lipid Panel:     Component Value Date/Time   CHOL 127 11/22/2015 0418   TRIG 129 11/22/2015 0418   HDL 24 (L) 11/22/2015 0418   CHOLHDL 5.3 11/22/2015 0418   VLDL 26 11/22/2015 0418   LDLCALC 77 11/22/2015 0418   HgbA1c:  Lab Results  Component Value Date   HGBA1C 7.4 (H) 11/22/2015   Urine Drug Screen: No results found for: LABOPIA, COCAINSCRNUR, LABBENZ, AMPHETMU, THCU, LABBARB    IMAGING I have personally reviewed the radiological images below and agree with the radiology interpretations.  Dg Chest 2 View 11/21/2015 Findings above are worrisome for mild volume overload.   Ct Head Wo Contrast 11/21/2015 1. Acute to early subacute infarcts within the left greater than right temporal lobes. 2. Suspected small amount of petechial hemorrhage within the left temporal ischemic region. No intraparenchymal hematoma. 3. No midline shift or mass effect.   Mr Brain Wo Contrast (neuro  Protocol) 11/21/2015 Acute infarct left medial temporal lobe in the left PCA territory. Acute infarct in the right temporoparietal lobe in the right MCA territory. Findings suggest emboli. Incomplete study due to lack of patient cooperation.   2D Echocardiogram  Left ventricle: The cavity size was normal. There was moderate concentric hypertrophy. Systolic function was normal. The estimated ejection fraction was in the range of 55% to 60%. Wall motion was normal; there were no regional wall motion abnormalities. Doppler parameters are consistent with a reversible restrictive pattern, indicative of decreased left ventricular diastolic compliance and/or increased left atrial pressure (grade 3 diastolic dysfunction). - Aortic valve: Right coronary and left coronary cusp mobility was restricted. Sclerosis without stenosis. There was no regurgitation. Valve area (VTI): 1.56 cm^2. Valve area (Vmax): 1.5 cm^2. Valve area (Vmean): 1.79 cm^2. - Mitral valve: Calcified annulus. Mobility of the posterior leaflet was restricted. Transvalvular velocity was within the normal range. There was no evidence for stenosis. There was trivial regurgitation. Valve area by pressure half-time: 1.98 cm^2. Valve area by continuity equation (using LVOT flow): 1.94 cm^2. - Left atrium: The atrium was severely dilated. Diameter 44 - Right ventricle: The cavity size was normal. Wall thickness was normal. Systolic function was normal. - Right atrium: The atrium was severely dilated. - Atrial septum: No defect or patent foramen ovale was identified by  color flow Doppler. - Tricuspid valve: There was severe regurgitation. - Pulmonary arteries: Systolic pressure was severely increased. PA peak pressure: 81 mm Hg (S). - Pericardium, extracardiac: A trivial pericardial effusion was identified. Ascites was noted.  CUS  Findings suggest 1-39% internal carotid artery stenosis bilaterally. Vertebral arteries are patent with antegrade flow.  The left subclavian artery exhibits elevated velocities (454/105) suggestive of hemodynamically significant stenosis.   PHYSICAL EXAM  Temp:  [98 F (36.7 C)-98.4 F (36.9 C)] 98 F (36.7 C) (10/12 0914) Pulse Rate:  [53-60] 53 (10/12 0914) Resp:  [16-17] 16 (10/12 0914) BP: (136-145)/(30-54) 136/54 (10/12 0914) SpO2:  [97 %-100 %] 100 % (10/12 0914)  General - Well nourished, well developed, in no apparent distress.  Ophthalmologic - Fundi not visualized due to noncooperation.  Cardiovascular - irregularly irregular heart rate and rhythm.  Mental Status -  Level of arousal and orientation to self and person were intact, but not orientated to age, time or place. Language including expression, repetition, comprehension was assessed and found intact, however with mild perseveration and naming difficulty.  Cranial Nerves II - XII - II - bilateral can only see HW, not able to CF. III, IV, VI - Extraocular movements intact. V - Facial sensation intact bilaterally. VII - Facial movement intact bilaterally. VIII - Hearing & vestibular intact bilaterally. X - Palate elevates symmetrically. XI - Chin turning & shoulder shrug intact bilaterally. XII - Tongue protrusion intact.  Motor Strength - The patient's strength was normal in all extremities and pronator drift was absent.  Bulk was normal and fasciculations were absent.   Motor Tone - Muscle tone was assessed at the neck and appendages and was normal.  Reflexes - The patient's reflexes were 1+ in all extremities and she had no pathological reflexes.  Sensory - Light touch, temperature/pinprick were assessed and were decreased on the LLE    Coordination - The patient had normal movements in the hands with no ataxia or dysmetria.  Tremor was absent.  Gait and Station - not tested due to safety concerns.   ASSESSMENT/PLAN [name] is a 75 y.o. female with history of ESRD on HD TTS, HTN, HLD, DM, atrial fibrillation  on aspirin presenting with altered mental status that started during HD. She did not receive IV t-PA.   Stroke:  left PCA infarct and right MCA, infarct embolic secondary to known atrial fibrillation not on AC  Resultant  Disorientation, LLE numbness  MRI  L PCA and right MCA infarct  MRA  Not needed as not changing management  Carotid Doppler  Unremarkable except left subclavian artery stenosis  2D Echo  EF 55-60%. No source of embolus, however, severe dilated atria bilaterally  LDL 77  HgbA1c 8.0  Heparin 5000 units sq tid for VTE prophylaxis  aspirin 81 mg daily prior to admission, now on aspirin 325 mg daily. Family would like to continue ASA, and no AC    Patient counseled to be compliant with her antithrombotic medications  Ongoing aggressive stroke risk factor management  Therapy recommendations:  pending   Disposition:  pending   Atrial Fibrillation  Home anticoagulation:  none   CHA2DS2-VASc Score = 5, ?2 oral anticoagulation recommended  Pt family optioned to ASA last admission after right radial artery thrombosis  Family would like to continue ASA, not to start [facility].  Left subclavian stenosis  CUS showed left subclavian high grade stenosis  Currently asymptomatic  Recommend outpt follow up with [facility].  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 Please call with questions. Pt will follow up with Dr. Erlinda Hong at [facility] in about 6 weeks. Thanks for the consult.   Rosalin Hawking, MD PhD Stroke Neurology 11/23/2015 10:48 PM  To contact Stroke Continuity provider, please refer to [email or web address] After hours, contact General Neurology

## 2015-11-27 ENCOUNTER — Encounter: Payer: Self-pay | Admitting: Vascular Surgery

## 2015-11-30 NOTE — Progress Notes (Deleted)
    Postoperative Visit   History of Present Illness  [name] is a 75 y.o. year old female who presents for postoperative follow-up for: R radial artery TE (Date: 11/10/15).  The patient's wounds are *** healed.  The patient notes *** steal symptoms.  The patient is *** able to complete their activities of daily living.  The patient's current symptoms are: ***.  For VQI Use Only  PRE-ADM LIVING: {VQI Pre-admission Living:20973}  AMB STATUS: {VQI Ambulatory Status:20974}  Physical Examination There were no vitals filed for this visit.  RUE: Incision is *** healed, skin feels ***, hand grip is ***/5, sensation in digits is *** intact, ***palpable thrill, bruit can *** be auscultated   Medical Decision Making  [name] is a 75 y.o. year old female who presents s/p R radial artery TE.   ***  Thank you for allowing [place] to participate in this patient's care.  Adele Barthel, MD, FACS [facility] Office: [phone] Pager: [phone]

## 2015-12-01 ENCOUNTER — Ambulatory Visit: Payer: Self-pay | Admitting: Vascular Surgery

## 2015-12-04 ENCOUNTER — Encounter: Payer: Self-pay | Admitting: Vascular Surgery

## 2015-12-04 NOTE — Progress Notes (Deleted)
    Postoperative Access Visit   History of Present Illness  [name] is a 75 y.o. year old female who presents for postoperative follow-up for: R radial artery TE (Date: 11/10/15).  The patient's wounds are *** healed.  The patient's current symptoms are: ***.  For VQI Use Only  PRE-ADM LIVING: {VQI Pre-admission Living:20973}  AMB STATUS: {VQI Ambulatory Status:20974}  Physical Examination There were no vitals filed for this visit.  RUE: Incision is *** healed,***radial pulse is palpable  Medical Decision Making  [name] is a 75 y.o. year old female who presents s/p R radial artery TE.   ***  Thank you for allowing [place] to participate in this patient's care.  Adele Barthel, MD, FACS [facility] Office: [phone] Pager: [phone]

## 2015-12-06 ENCOUNTER — Inpatient Hospital Stay (HOSPITAL_COMMUNITY)
Admission: EM | Admit: 2015-12-06 | Discharge: 2015-12-12 | DRG: 871 | Disposition: A | Discharge status: Hospice | Payer: Medicare Other | Attending: Internal Medicine | Admitting: Internal Medicine

## 2015-12-06 ENCOUNTER — Inpatient Hospital Stay (HOSPITAL_COMMUNITY): Payer: Medicare Other

## 2015-12-06 ENCOUNTER — Encounter (HOSPITAL_COMMUNITY): Payer: Self-pay | Admitting: Physician Assistant

## 2015-12-06 ENCOUNTER — Ambulatory Visit: Payer: Self-pay | Admitting: Vascular Surgery

## 2015-12-06 ENCOUNTER — Emergency Department (HOSPITAL_COMMUNITY): Payer: Medicare Other

## 2015-12-06 DIAGNOSIS — R109 Unspecified abdominal pain: Secondary | ICD-10-CM

## 2015-12-06 DIAGNOSIS — Z79899 Other long term (current) drug therapy: Secondary | ICD-10-CM | POA: Diagnosis not present

## 2015-12-06 DIAGNOSIS — E1122 Type 2 diabetes mellitus with diabetic chronic kidney disease: Secondary | ICD-10-CM | POA: Diagnosis present

## 2015-12-06 DIAGNOSIS — A419 Sepsis, unspecified organism: Secondary | ICD-10-CM | POA: Diagnosis present

## 2015-12-06 DIAGNOSIS — N186 End stage renal disease: Secondary | ICD-10-CM | POA: Diagnosis not present

## 2015-12-06 DIAGNOSIS — K7460 Unspecified cirrhosis of liver: Secondary | ICD-10-CM | POA: Diagnosis not present

## 2015-12-06 DIAGNOSIS — I482 Chronic atrial fibrillation: Secondary | ICD-10-CM | POA: Diagnosis present

## 2015-12-06 DIAGNOSIS — D689 Coagulation defect, unspecified: Secondary | ICD-10-CM | POA: Diagnosis present

## 2015-12-06 DIAGNOSIS — Z66 Do not resuscitate: Secondary | ICD-10-CM | POA: Diagnosis not present

## 2015-12-06 DIAGNOSIS — K7469 Other cirrhosis of liver: Secondary | ICD-10-CM | POA: Diagnosis not present

## 2015-12-06 DIAGNOSIS — I629 Nontraumatic intracranial hemorrhage, unspecified: Secondary | ICD-10-CM

## 2015-12-06 DIAGNOSIS — C8590 Non-Hodgkin lymphoma, unspecified, unspecified site: Secondary | ICD-10-CM | POA: Diagnosis present

## 2015-12-06 DIAGNOSIS — E872 Acidosis: Secondary | ICD-10-CM | POA: Diagnosis present

## 2015-12-06 DIAGNOSIS — I639 Cerebral infarction, unspecified: Secondary | ICD-10-CM

## 2015-12-06 DIAGNOSIS — N250 Renal osteodystrophy: Secondary | ICD-10-CM | POA: Diagnosis present

## 2015-12-06 DIAGNOSIS — I619 Nontraumatic intracerebral hemorrhage, unspecified: Secondary | ICD-10-CM

## 2015-12-06 DIAGNOSIS — E119 Type 2 diabetes mellitus without complications: Secondary | ICD-10-CM

## 2015-12-06 DIAGNOSIS — Z8551 Personal history of malignant neoplasm of bladder: Secondary | ICD-10-CM

## 2015-12-06 DIAGNOSIS — I248 Other forms of acute ischemic heart disease: Secondary | ICD-10-CM | POA: Diagnosis present

## 2015-12-06 DIAGNOSIS — E1165 Type 2 diabetes mellitus with hyperglycemia: Secondary | ICD-10-CM | POA: Diagnosis present

## 2015-12-06 DIAGNOSIS — R188 Other ascites: Secondary | ICD-10-CM | POA: Diagnosis present

## 2015-12-06 DIAGNOSIS — I771 Stricture of artery: Secondary | ICD-10-CM | POA: Diagnosis present

## 2015-12-06 DIAGNOSIS — I5032 Chronic diastolic (congestive) heart failure: Secondary | ICD-10-CM | POA: Diagnosis present

## 2015-12-06 DIAGNOSIS — Z992 Dependence on renal dialysis: Secondary | ICD-10-CM

## 2015-12-06 DIAGNOSIS — Z8673 Personal history of transient ischemic attack (TIA), and cerebral infarction without residual deficits: Secondary | ICD-10-CM | POA: Diagnosis not present

## 2015-12-06 DIAGNOSIS — D649 Anemia, unspecified: Secondary | ICD-10-CM | POA: Diagnosis present

## 2015-12-06 DIAGNOSIS — Z515 Encounter for palliative care: Secondary | ICD-10-CM | POA: Diagnosis not present

## 2015-12-06 DIAGNOSIS — R410 Disorientation, unspecified: Secondary | ICD-10-CM

## 2015-12-06 DIAGNOSIS — I6529 Occlusion and stenosis of unspecified carotid artery: Secondary | ICD-10-CM | POA: Diagnosis present

## 2015-12-06 DIAGNOSIS — Z7189 Other specified counseling: Secondary | ICD-10-CM | POA: Diagnosis not present

## 2015-12-06 DIAGNOSIS — Z7982 Long term (current) use of aspirin: Secondary | ICD-10-CM

## 2015-12-06 DIAGNOSIS — E785 Hyperlipidemia, unspecified: Secondary | ICD-10-CM

## 2015-12-06 DIAGNOSIS — R509 Fever, unspecified: Secondary | ICD-10-CM | POA: Diagnosis present

## 2015-12-06 DIAGNOSIS — K652 Spontaneous bacterial peritonitis: Secondary | ICD-10-CM | POA: Diagnosis present

## 2015-12-06 DIAGNOSIS — E118 Type 2 diabetes mellitus with unspecified complications: Secondary | ICD-10-CM | POA: Diagnosis not present

## 2015-12-06 DIAGNOSIS — I10 Essential (primary) hypertension: Secondary | ICD-10-CM

## 2015-12-06 DIAGNOSIS — I132 Hypertensive heart and chronic kidney disease with heart failure and with stage 5 chronic kidney disease, or end stage renal disease: Secondary | ICD-10-CM | POA: Diagnosis present

## 2015-12-06 DIAGNOSIS — Z7984 Long term (current) use of oral hypoglycemic drugs: Secondary | ICD-10-CM | POA: Diagnosis not present

## 2015-12-06 DIAGNOSIS — G9340 Encephalopathy, unspecified: Secondary | ICD-10-CM | POA: Diagnosis not present

## 2015-12-06 DIAGNOSIS — E1121 Type 2 diabetes mellitus with diabetic nephropathy: Secondary | ICD-10-CM | POA: Diagnosis present

## 2015-12-06 DIAGNOSIS — I4891 Unspecified atrial fibrillation: Secondary | ICD-10-CM

## 2015-12-06 LAB — COMPREHENSIVE METABOLIC PANEL
ALT: 12 U/L — AB (ref 14–54)
ANION GAP: 15 (ref 5–15)
AST: 40 U/L (ref 15–41)
Albumin: 3.4 g/dL — ABNORMAL LOW (ref 3.5–5.0)
Alkaline Phosphatase: 86 U/L (ref 38–126)
BUN: 16 mg/dL (ref 6–20)
CHLORIDE: 94 mmol/L — AB (ref 101–111)
CO2: 26 mmol/L (ref 22–32)
Calcium: 9.2 mg/dL (ref 8.9–10.3)
Creatinine, Ser: 3.92 mg/dL — ABNORMAL HIGH (ref 0.44–1.00)
GFR calc non Af Amer: 10 mL/min — ABNORMAL LOW (ref >60)
GFR, EST AFRICAN AMERICAN: 12 mL/min — AB (ref >60)
Glucose, Bld: 308 mg/dL — ABNORMAL HIGH (ref 65–99)
Potassium: 3.9 mmol/L (ref 3.5–5.1)
SODIUM: 135 mmol/L (ref 135–145)
Total Bilirubin: 0.8 mg/dL (ref 0.3–1.2)
Total Protein: 7.3 g/dL (ref 6.5–8.1)

## 2015-12-06 LAB — CBC WITH DIFFERENTIAL/PLATELET
Basophils Absolute: 0.1 10*3/uL (ref 0.0–0.1)
Basophils Relative: 1 %
Eosinophils Absolute: 0 10*3/uL (ref 0.0–0.7)
Eosinophils Relative: 0 %
HCT: 38.4 % (ref 36.0–46.0)
HEMOGLOBIN: 12.6 g/dL (ref 12.0–15.0)
LYMPHS ABS: 0.9 10*3/uL (ref 0.7–4.0)
LYMPHS PCT: 10 %
MCH: 32.6 pg (ref 26.0–34.0)
MCHC: 32.8 g/dL (ref 30.0–36.0)
MCV: 99.5 fL (ref 78.0–100.0)
Monocytes Absolute: 1 10*3/uL (ref 0.1–1.0)
Monocytes Relative: 10 %
NEUTROS PCT: 79 %
Neutro Abs: 7.7 10*3/uL (ref 1.7–7.7)
Platelets: 259 10*3/uL (ref 150–400)
RBC: 3.86 MIL/uL — AB (ref 3.87–5.11)
RDW: 14.5 % (ref 11.5–15.5)
WBC: 9.7 10*3/uL (ref 4.0–10.5)

## 2015-12-06 LAB — PROTIME-INR
INR: 1.39
INR: 1.56
PROTHROMBIN TIME: 17.2 s — AB (ref 11.4–15.2)
Prothrombin Time: 18.8 seconds — ABNORMAL HIGH (ref 11.4–15.2)

## 2015-12-06 LAB — TROPONIN I
TROPONIN I: 0.24 ng/mL — AB (ref <0.03)
TROPONIN I: 1.01 ng/mL — AB (ref <0.03)

## 2015-12-06 LAB — PROCALCITONIN
PROCALCITONIN: 12.67 ng/mL
Procalcitonin: 0.44 ng/mL

## 2015-12-06 LAB — LACTIC ACID, PLASMA
LACTIC ACID, VENOUS: 2.6 mmol/L — AB (ref 0.5–1.9)
LACTIC ACID, VENOUS: 3.2 mmol/L — AB (ref 0.5–1.9)

## 2015-12-06 LAB — I-STAT CHEM 8, ED
BUN: 22 mg/dL — ABNORMAL HIGH (ref 6–20)
CALCIUM ION: 0.98 mmol/L — AB (ref 1.15–1.40)
Chloride: 95 mmol/L — ABNORMAL LOW (ref 101–111)
Creatinine, Ser: 3.9 mg/dL — ABNORMAL HIGH (ref 0.44–1.00)
GLUCOSE: 303 mg/dL — AB (ref 65–99)
HCT: 41 % (ref 36.0–46.0)
HEMOGLOBIN: 13.9 g/dL (ref 12.0–15.0)
POTASSIUM: 3.8 mmol/L (ref 3.5–5.1)
Sodium: 134 mmol/L — ABNORMAL LOW (ref 135–145)
TCO2: 29 mmol/L (ref 0–100)

## 2015-12-06 LAB — LIPASE, BLOOD: Lipase: 23 U/L (ref 11–51)

## 2015-12-06 LAB — APTT: APTT: 24 s (ref 24–36)

## 2015-12-06 LAB — I-STAT CG4 LACTIC ACID, ED: Lactic Acid, Venous: 3 mmol/L (ref 0.5–1.9)

## 2015-12-06 LAB — AMMONIA: Ammonia: 70 umol/L — ABNORMAL HIGH (ref 9–35)

## 2015-12-06 LAB — BRAIN NATRIURETIC PEPTIDE: B NATRIURETIC PEPTIDE 5: 4306.8 pg/mL — AB (ref 0.0–100.0)

## 2015-12-06 MED ORDER — LABETALOL HCL 200 MG PO TABS: 200.0000 mg | ORAL_TABLET | Freq: Two times a day (BID) | ORAL | Status: DC

## 2015-12-06 MED ORDER — SENNOSIDES-DOCUSATE SODIUM 8.6-50 MG PO TABS: 1.0000 | ORAL_TABLET | Freq: Every evening | ORAL | Status: DC | PRN

## 2015-12-06 MED ORDER — VANCOMYCIN HCL 500 MG IV SOLR
500.0000 mg | INTRAVENOUS | Status: DC
  Filled 2015-12-06: qty 500

## 2015-12-06 MED ORDER — VANCOMYCIN HCL IN DEXTROSE 1-5 GM/200ML-% IV SOLN
1000.0000 mg | Freq: Once | INTRAVENOUS | Status: AC
  Administered 2015-12-06: 1000 mg via INTRAVENOUS
  Filled 2015-12-06: qty 200

## 2015-12-06 MED ORDER — ONDANSETRON HCL 4 MG/2ML IJ SOLN: 4.0000 mg | Freq: Four times a day (QID) | INTRAMUSCULAR | Status: DC | PRN

## 2015-12-06 MED ORDER — ACETAMINOPHEN 325 MG PO TABS: 650.0000 mg | ORAL_TABLET | Freq: Four times a day (QID) | ORAL | Status: DC | PRN

## 2015-12-06 MED ORDER — BISACODYL 10 MG RE SUPP: 10.0000 mg | Freq: Every day | RECTAL | Status: DC | PRN

## 2015-12-06 MED ORDER — ASPIRIN 300 MG RE SUPP
300.0000 mg | RECTAL | Status: DC | PRN
  Administered 2015-12-06 – 2015-12-07 (×2): 300 mg via RECTAL
  Filled 2015-12-06 (×2): qty 1

## 2015-12-06 MED ORDER — IBUPROFEN 100 MG/5ML PO SUSP
600.0000 mg | Freq: Once | ORAL | Status: DC
  Filled 2015-12-06 (×3): qty 30

## 2015-12-06 MED ORDER — KETOROLAC TROMETHAMINE 15 MG/ML IJ SOLN
15.0000 mg | Freq: Once | INTRAMUSCULAR | Status: AC
  Administered 2015-12-06: 15 mg via INTRAVENOUS
  Filled 2015-12-06: qty 1

## 2015-12-06 MED ORDER — PIPERACILLIN-TAZOBACTAM IN DEX 2-0.25 GM/50ML IV SOLN: 2.2500 g | Freq: Three times a day (TID) | INTRAVENOUS | Status: DC

## 2015-12-06 MED ORDER — CINACALCET HCL 30 MG PO TABS
60.0000 mg | ORAL_TABLET | Freq: Every day | ORAL | Status: DC
  Filled 2015-12-06 (×3): qty 2

## 2015-12-06 MED ORDER — MAGNESIUM CITRATE PO SOLN: 1.0000 | Freq: Once | ORAL | Status: DC | PRN

## 2015-12-06 MED ORDER — PIPERACILLIN-TAZOBACTAM 3.375 G IVPB 30 MIN
3.3750 g | Freq: Once | INTRAVENOUS | Status: AC
  Administered 2015-12-06: 3.375 g via INTRAVENOUS
  Filled 2015-12-06: qty 50

## 2015-12-06 MED ORDER — PIPERACILLIN-TAZOBACTAM 3.375 G IVPB
3.3750 g | Freq: Two times a day (BID) | INTRAVENOUS | Status: DC
  Administered 2015-12-07 – 2015-12-10 (×7): 3.375 g via INTRAVENOUS
  Filled 2015-12-06 (×9): qty 50

## 2015-12-06 MED ORDER — SODIUM CHLORIDE 0.9% FLUSH
3.0000 mL | Freq: Two times a day (BID) | INTRAVENOUS | Status: DC
  Administered 2015-12-07 – 2015-12-12 (×8): 3 mL via INTRAVENOUS

## 2015-12-06 MED ORDER — LACTULOSE ENEMA
300.0000 mL | Freq: Once | ORAL | Status: AC
  Administered 2015-12-06: 300 mL via RECTAL
  Filled 2015-12-06: qty 300

## 2015-12-06 MED ORDER — IBUPROFEN 400 MG PO TABS
600.0000 mg | ORAL_TABLET | Freq: Once | ORAL | Status: DC
  Filled 2015-12-06: qty 1

## 2015-12-06 MED ORDER — INSULIN ASPART 100 UNIT/ML ~~LOC~~ SOLN
0.0000 [IU] | Freq: Three times a day (TID) | SUBCUTANEOUS | Status: DC
  Administered 2015-12-07 – 2015-12-09 (×4): 1 [IU] via SUBCUTANEOUS

## 2015-12-06 MED ORDER — ACETAMINOPHEN 650 MG RE SUPP: 650.0000 mg | Freq: Four times a day (QID) | RECTAL | Status: DC | PRN

## 2015-12-06 MED ORDER — VANCOMYCIN HCL IN DEXTROSE 750-5 MG/150ML-% IV SOLN: 750.0000 mg | INTRAVENOUS | Status: DC

## 2015-12-06 MED ORDER — SODIUM CHLORIDE 0.9 % IV SOLN: INTRAVENOUS | Status: AC

## 2015-12-06 MED ORDER — TRAZODONE HCL 50 MG PO TABS: 25.0000 mg | ORAL_TABLET | Freq: Every evening | ORAL | Status: DC | PRN

## 2015-12-06 MED ORDER — HYDROCODONE-ACETAMINOPHEN 5-325 MG PO TABS: 1.0000 | ORAL_TABLET | ORAL | Status: DC | PRN

## 2015-12-06 MED ORDER — ONDANSETRON HCL 4 MG PO TABS: 4.0000 mg | ORAL_TABLET | Freq: Four times a day (QID) | ORAL | Status: DC | PRN

## 2015-12-06 MED ORDER — IOPAMIDOL (ISOVUE-300) INJECTION 61%
INTRAVENOUS | Status: AC
  Administered 2015-12-06: 100 mL
  Filled 2015-12-06: qty 100

## 2015-12-06 NOTE — ED Notes (Signed)
Family at bedside, [place] notified.

## 2015-12-06 NOTE — ED Notes (Signed)
Admitting at bedside 

## 2015-12-06 NOTE — ED Notes (Signed)
Placed cool rag on pt's forehead.

## 2015-12-06 NOTE — H&P (Signed)
History and Physical    [name] [id number] DOB: 02-Dec-1940 DOA: 12/06/2015   PCP: No primary care provider on file.   Patient coming from:  Home    Chief Complaint: Abdominal pain  PLEASE REFER TO CHART  MRN [id number] AS THIS IS BEING MERGED WITH CUrRENT chart  HPI: [name] is a 75 y.o. 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 History is obtained by the family, the patient is unable to provide due to increased confusion. She was complaining of increased constipation. On presentation, she was initially afebrile. She did not report any respiratory or cardiac complaints. A CT of the abdomen  showed stable cirrhosis and stable ascites. She was in the process of being discharged. However, she became febrile with T max at 103, with repeat 101. She reported chills, and rigors. Status was complicated by increased confusion. At the time of visit, patient is lethargic, unable to interact, and very ill appearing. Family is at bedside, reporting these symptoms to be new.   ED Course:  BP (!) 124/45   Pulse 84   Temp 102.5 F (39.2 C) (Oral)   Resp (!) 27   Wt 53 kg (116 lb 13.5 oz)   SpO2 95%     Bicarb 26, BUN 22, Cr 3.9  Glu 303 Lactic Acid 3 Hb 13 WBC normal Received Vanc and Zosyn  CT head performed due to increased confusion remarkable for Changes consistent with parenchymal hemorrhage in the temporo-occipital regions bilaterally. There is some suggestion of subarachnoid extension along the inferior aspect in the right occipital lobe Review of Systems: As per HPI otherwise 10 point review of systems negative.   History reviewed. No pertinent past medical history.  History reviewed. No pertinent surgical history.  Social History Social History   Social History  . Marital status: Married    Spouse name: N/A    . Number of children: N/A  . Years of education: N/A   Occupational History  . Not on file.   Social History Main Topics  . Smoking status: Never Smoker  . Smokeless tobacco: Never Used  . Alcohol use Not on file  . Drug use: Unknown  . Sexual activity: Not on file   Other Topics Concern  . Not on file   Social History Narrative  . No narrative on file     Not on File  History reviewed. No pertinent family history.    Prior to Admission medications   Medication Sig Start Date End Date Taking? Authorizing Provider  aspirin 325 MG tablet Take 325 mg by mouth every 6 (six) hours as needed for mild pain.   Yes Historical Provider, MD  cinacalcet (SENSIPAR) 60 MG tablet Take 60 mg by mouth daily.   Yes Historical Provider, MD  glipiZIDE (GLUCOTROL) 10 MG tablet Take 10 mg by mouth daily before breakfast.   Yes Historical Provider, MD  labetalol (NORMODYNE) 200 MG tablet Take 200 mg by mouth 2 (two) times daily.   Yes Historical Provider, MD  omeprazole (PRILOSEC) 20 MG capsule Take 20 mg by mouth daily.   Yes Historical Provider, MD  sorbitol 70 % solution Take 15 mLs by mouth daily as needed.   Yes Historical Provider, MD    Physical Exam:    Vitals:   12/06/15 1551 12/06/15 1600  12/06/15 1631 12/06/15 1632  BP:  115/75 (!) 109/42   Pulse:  81 77   Resp:  (!) 31 (!) 31   Temp: 102.5 F (39.2 C)   100.1 F (37.8 C)  TempSrc: Oral   Axillary  SpO2:  99% 99%   Weight:           Constitutional: NAD, ill appearing, very confused Vitals:   12/06/15 1551 12/06/15 1600 12/06/15 1631 12/06/15 1632  BP:  115/75 (!) 109/42   Pulse:  81 77   Resp:  (!) 31 (!) 31   Temp: 102.5 F (39.2 C)   100.1 F (37.8 C)  TempSrc: Oral   Axillary  SpO2:  99% 99%   Weight:       Eyes: PERRL, lids and conjunctivae normal ENMT: Mucous membranes are moist. Posterior pharynx clear of any exudate or lesions.Edentulous  Neck: normal, supple, no masses, no thyromegaly Respiratory:  clear to auscultation bilaterally, no wheezing, no crackles. Normal respiratory effort. No accessory muscle use.  Cardiovascular:  Tachy rate and rhythm, soft 1/6 murmurs / rubs / gallops. Trace lower extremity edema. 2+ pedal pulses. No carotid bruits.  Abdomen:  Appears tender to palpation, firm,  no masses palpated. No hepatosplenomegaly. Bowel sounds positive.  Musculoskeletal: no clubbing / cyanosis. No joint deformity upper and lower extremities. Good ROM, no contractures. Normal muscle tone.  Skin: no rashes, lesions, ulcers.. Some old ecchymoses in the RUE. LAVG  Neurologic known LLE weakness, other strength unable  assess . No other neurological abnormalities noted per family      Labs on Admission: I have personally reviewed following labs and imaging studies  CBC:  Recent Labs Lab 12/06/15 0752 12/06/15 0816  WBC 9.7  --   NEUTROABS 7.7  --   HGB 12.6 13.9  HCT 38.4 41.0  MCV 99.5  --   PLT 259  --     Basic Metabolic Panel:  Recent Labs Lab 12/06/15 0752 12/06/15 0816  NA 135 134*  K 3.9 3.8  CL 94* 95*  CO2 26  --   GLUCOSE 308* 303*  BUN 16 22*  CREATININE 3.92* 3.90*  CALCIUM 9.2  --     GFR: CrCl cannot be calculated (Unknown ideal weight.).  Liver Function Tests:  Recent Labs Lab 12/06/15 0752  AST 40  ALT 12*  ALKPHOS 86  BILITOT 0.8  PROT 7.3  ALBUMIN 3.4*    Recent Labs Lab 12/06/15 0752  LIPASE 23   No results for input(s): AMMONIA in the last 168 hours.  Coagulation Profile:  Recent Labs Lab 12/06/15 0752  INR 1.39    Cardiac Enzymes: No results for input(s): CKTOTAL, CKMB, CKMBINDEX, TROPONINI in the last 168 hours.  BNP (last 3 results) No results for input(s): PROBNP in the last 8760 hours.  HbA1C: No results for input(s): HGBA1C in the last 72 hours.  CBG: No results for input(s): GLUCAP in the last 168 hours.  Lipid Profile: No results for input(s): CHOL, HDL, LDLCALC, TRIG, CHOLHDL, LDLDIRECT in the last  72 hours.  Thyroid Function Tests: No results for input(s): TSH, T4TOTAL, FREET4, T3FREE, THYROIDAB in the last 72 hours.  Anemia Panel: No results for input(s): VITAMINB12, FOLATE, FERRITIN, TIBC, IRON, RETICCTPCT in the last 72 hours.  Urine analysis: No results found for: COLORURINE, APPEARANCEUR, LABSPEC, PHURINE, GLUCOSEU, HGBUR, BILIRUBINUR, KETONESUR, PROTEINUR, UROBILINOGEN, NITRITE, LEUKOCYTESUR  Sepsis Labs: @LABRCNTIP (procalcitonin:4,lacticidven:4) )No results found for this or any previous visit (from the past 240 hour(s)).  Radiological Exams on Admission: Ct Head Wo Contrast  Result Date: 12/06/2015 CLINICAL DATA:  Increasing confusion and history of recent CVA EXAM: CT HEAD WITHOUT CONTRAST TECHNIQUE: Contiguous axial images were obtained from the base of the skull through the vertex without intravenous contrast. COMPARISON:  None. FINDINGS: Brain: There are areas of parenchymal hemorrhage identified in the right temporo-occipital region as well as in the left temporo-occipital area inferomedially along the tentorium cerebelli. The right-sided area measures approximately 6.3 cm in greatest dimension. The area on the left measures approximately 5.5 cm greatest dimension. Some superior extension towards the left lateral ventricle is noted with some buckling of the lateral ventricular wall inferiorly although no definitive intraventricular component of hemorrhage is seen. Some linear areas are noted along the inferior aspect of the hemorrhage on the right suggesting the possibility of such as subarachnoid component. Some mild white matter edema is noted in the left occipital lobe related to the hemorrhage. Mild atrophic changes are seen. Vascular: No hyperdense vessel or unexpected calcification. Skull: Normal. Negative for fracture or focal lesion. Sinuses/Orbits: No acute finding. Other: None. IMPRESSION: Changes consistent with parenchymal hemorrhage in the temporo-occipital  regions bilaterally. There is some suggestion of subarachnoid extension along the inferior aspect in the right occipital lobe. Critical Value/emergent results were discussed by telephone at the time of interpretation on 12/06/2015 at 4:45 pm with the admitting physician, who verbally acknowledged these results. Electronically Signed   By: Inez Catalina M.D.   On: 12/06/2015 16:46   Ct Abdomen Pelvis W Contrast  Result Date: 12/06/2015 CLINICAL DATA:  Generalized abdominal pain and distention. EXAM: CT ABDOMEN AND PELVIS WITH CONTRAST TECHNIQUE: Multidetector CT imaging of the abdomen and pelvis was performed using the standard protocol following bolus administration of intravenous contrast. CONTRAST:  159mL ISOVUE-300 IOPAMIDOL (ISOVUE-300) INJECTION 61% COMPARISON:  None. FINDINGS: Lower chest: Visualized lung bases are unremarkable. Hepatobiliary: No gallstones are noted. Nodular hepatic margins are noted concerning for hepatic cirrhosis. No focal abnormality is noted within the liver. Pancreas: Normal. Spleen: Irregular low densities are noted within the spleen which are peripheral and may represent old infarctions. Adrenals/Urinary Tract: Adrenal glands appear normal. Severe bilateral renal atrophy is noted consistent with history of end-stage renal disease. Stomach/Bowel: There is no evidence of bowel obstruction. Vascular/Lymphatic: Atherosclerosis of abdominal aorta is noted without aneurysm formation. No significant adenopathy is noted. Reproductive: Uterus and ovaries are unremarkable. Other: Large amount of ascites is noted. Probable large fluid-filled left femoral hernia is noted. 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 IMPRESSION: Nodular hepatic margins are noted concerning for hepatic cirrhosis. Large amount of ascites is noted. Irregular peripheral low densities are noted most consistent with old  infarctions. Severe bilateral renal atrophy is noted consistent with history of end-stage renal disease. 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 Aortic atherosclerosis. Probable large fluid-filled left femoral hernia is noted. Electronically Signed   By: Marijo Conception, M.D.   On: 12/06/2015 10:50   Dg Chest Port 1 View  Result Date: 12/06/2015 CLINICAL DATA:  Sepsis EXAM: PORTABLE CHEST 1 VIEW COMPARISON:  Chest radiograph from earlier today. FINDINGS: Right rotated chest radiograph. Stable cardiomediastinal silhouette with cardiomegaly and aortic atherosclerosis. No pneumothorax. No pleural effusion. Hazy and linear diffuse parahilar opacities in both lungs appear stable. IMPRESSION: Stable cardiomegaly. Hazy and linear diffuse parahilar opacities in both lungs appear stable and could represent mild pulmonary edema or  infection. Electronically Signed   By: Ilona Sorrel M.D.   On: 12/06/2015 15:38   Dg Abd Acute W/chest  Result Date: 12/06/2015 CLINICAL DATA:  Abdominal pain.  Dialysis patient. EXAM: DG ABDOMEN ACUTE W/ 1V CHEST COMPARISON:  None. FINDINGS: Right rotated chest radiograph. Mild enlargement of the cardiac silhouette. Mildly tortuous atherosclerotic thoracic aorta. Otherwise normal mediastinal contour. No pneumothorax. No pleural effusion. Mild pulmonary edema. Mild bibasilar scarring versus atelectasis. No disproportionately dilated small bowel loops. Mild-to-moderate stool and gas throughout the colon. No evidence of pneumatosis or pneumoperitoneum. Extensive atherosclerotic calcifications are noted throughout the abdominopelvic vessels. IMPRESSION: 1. Mild enlargement of the cardiac silhouette. Mild pulmonary edema. 2. Mild scarring versus atelectasis of both lung bases. 3. Nonobstructive bowel gas pattern. 4. Aortic atherosclerosis. Electronically Signed   By: Ilona Sorrel M.D.   On: 12/06/2015 09:08    EKG: Independently reviewed.    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 Acute encephalopathy noted  Patient meets criteria given tachycardia, tachypnea, fever. No leukocytosis. Evidence of organ dysfunction. Cr3.9   Antibiotics delivered in the ED with Vanc and Zosyn . UA pending, Initial Lactic acid 3.0 T max 103  patient's ABG pending. Bicarb 26 . EKG Sinus Tach,   Patient on oxygen and titrate O2 as needed. Unable to receive IVF due to ESRD on HD CT head   parenchymal hemorrhage in the temporo-occipital regions bilaterally.   subarachnoid extension along the inferior aspect in the right occipital lobe noted  Admit to SDU Sepsis order set  IV antibiotics by pharmacy with Zosyn and Vanco Follow lactic acid q 3 hrs Follow blood and urine cultures Procalcitonin order set  UA, cultures Influenza PCR       Recent  CVA, left PCA and right MCA, suggestive of embolic etiology, discharged on 10/12 on ASA  At the time of d/c on 10/12  CT head showed acute infarcts within L >R temporal lobes. Suspect small amt petechial hemorrhage within left temporal ischemic region without intraparenchymal hematoma.  - MRI brain showed acute infarct left medial temporal lobe in the left PCA territory. Acute infarct in the right temporoparietal lobe in the right MCA territory. Findings suggest emboli . Mild carotid artery stenosis.  CT head repeated today due to confusion  - PT/OT/speech eval and treat    Atrial  fibrillation: Chronic. CHA2DS2-VASc score5. On  Aspirin. Not on  Anticoagulation due to underlying cirrhosis, frequent falls. Current EKG on ST  - Continue aspirin for now  Oral meds on hold    Chronic diastolic heart failure - Patient's last EF 10/2/17showed ejection fraction of 55-60% with grade 3 DD.  EKG S T. Check Tn and BNP  Continue meds when confusion improves CXR pending  WIll monitor   ESRD on HD(T/TH/Sat) - Nephro/HD following . Renal notified. Last HD on Tue 10/24  - Continue Sensipar and Renvela     Diabetes mellitus type 2, uncontrolled  Current Glu 303  -  Recent Ha1c 7.4  Hold oral meds SSI    DVT prophylaxis:  SCD's today, may initiate Heparin sq in am    Code Status:   Full  . Extensive discussion with family about this code status, family adamantly refuses to consider changing the code. THey do not wish to discuss this further .  Family Communication:  Discussed with patient family  Disposition Plan: Expect patient to be discharged to home after condition improves Consults called:   IR for possible paracentesis and to obtain fluid culture  Admission status:   SDU    Sharene Butters E, PA-C [facility]   If 7PM-7AM, please contact night-coverage [email or web address] Password [email or web address]  12/06/2015, 5:13 PM

## 2015-12-06 NOTE — Progress Notes (Signed)
Pharmacy Antibiotic Note  [name] is a 75 y.o. female admitted on 12/06/2015 with sepsis.  Pharmacy has been consulted for vancomycin and zosyn dosing. CC abdominal pain and distention. On HD Tu-Th-Sa.  Plan: Vancomycin 1000 mg IV x 1 then 500 mg IV qHD Zosyn 3.375g IV 30 min infusion then 3.375g IV extended infusion q12h Monitor clinical course and renal fx Follow up HD plans and adjust as needed    Temp (24hrs), Avg:100.9 F (38.3 C), Min:98.3 F (36.8 C), Max:103 F (39.4 C)   Recent Labs Lab 12/06/15 0752 12/06/15 0816 12/06/15 1322  WBC 9.7  --   --   CREATININE 3.92* 3.90*  --   LATICACIDVEN  --   --  3.00*    CrCl cannot be calculated (Unknown ideal weight.).   Estimated CrCl 11 mL/min, on HD  Not on File  Antimicrobials this admission:  Vancomycin 10/25 >> Zosyn 10/25 >>  Dose adjustments this admission:  N/A  Microbiology results:  10/25 BCx: Sent Thank you for allowing pharmacy to be a part of this patient's care.  Cheral Almas, PharmD Candidate 12/06/2015 2:31 PM

## 2015-12-06 NOTE — ED Notes (Signed)
EDP deciding not to admin fluid bolus due to ESRD.

## 2015-12-06 NOTE — ED Notes (Signed)
EDP at bedside  

## 2015-12-06 NOTE — Progress Notes (Signed)
Received via ED stretcher, responds to painful stimulation.  ED RN noted decreased temp.  Son who speaks fluent English oriented to room, plan & safety precautions.

## 2015-12-06 NOTE — ED Notes (Signed)
Neuro surgery at bedside.

## 2015-12-06 NOTE — ED Provider Notes (Signed)
[facility] DEPT Provider Note   CSN: [id number] Arrival date & time: 12/06/15  [id number]     History   Chief Complaint Chief Complaint  Patient presents with  . Altered Mental Status    HPI [name] is a 75 y.o. female.  Patient brought in by family members for the complaint of abdominal pain. Patient vomited once during the night. Patient drinks over pelvic thought she was constipated. Patient's a dialysis patient normally dialyzed Tuesdays Thursdays and Saturdays. Patient was dialyzed yesterday. Patient also has a history of liver failure and known to have ascites. Patient's been undergoing dialysis for 10 years or more patient's had the cirrhosis of long-standing as well. Patient's family not clear on who her primary care doctor is.      History reviewed. No pertinent past medical history.  Patient Active Problem List   Diagnosis Date Noted  . Fever 12/06/2015  . Sepsis ([place]) 12/06/2015  . Abdominal pain 12/06/2015  . Confusion 12/06/2015  . NHL (non-Hodgkin's lymphoma) ([place]) 12/06/2015  . Hypertension 12/06/2015  . Hyperlipidemia 12/06/2015  . CVA (cerebral vascular accident) ([place]) 12/06/2015  . DM (diabetes mellitus) ([place]) 12/06/2015  . Cirrhosis ([place]) 12/06/2015  . History of bladder cancer 12/06/2015  . Atrial fibrillation ([place]) 12/06/2015  . 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 No pertinent surgical history.  OB History    No data available       Home Medications    Prior to Admission medications   Medication Sig Start Date End Date Taking? Authorizing Provider  aspirin 325 MG tablet Take 325 mg by mouth every 6 (six) hours as needed for mild pain.   Yes Historical Provider, MD  cinacalcet (SENSIPAR) 60 MG tablet Take 60 mg by mouth daily.   Yes Historical Provider, MD  glipiZIDE (GLUCOTROL) 10 MG tablet Take 10 mg by mouth daily before breakfast.   Yes Historical Provider, MD  labetalol (NORMODYNE) 200 MG  tablet Take 200 mg by mouth 2 (two) times daily.   Yes Historical Provider, MD  omeprazole (PRILOSEC) 20 MG capsule Take 20 mg by mouth daily.   Yes Historical Provider, MD  sorbitol 70 % solution Take 15 mLs by mouth daily as needed.   Yes Historical Provider, MD    Family History History reviewed. No pertinent family history.  Social History Social History  Substance Use Topics  . Smoking status: Never Smoker  . Smokeless tobacco: Never Used  . Alcohol use Not on file     Allergies   Review of patient's allergies indicates not on file.   Review of Systems Review of Systems  Unable to perform ROS: Mental status change     Physical Exam Updated Vital Signs BP (!) 109/42   Pulse 77   Temp 100.1 F (37.8 C) (Axillary)   Resp (!) 31   Wt 53 kg   SpO2 99%   Physical Exam  Constitutional: She appears well-developed and well-nourished.  HENT:  Head: Normocephalic and atraumatic.  Mouth/Throat: Oropharynx is clear and moist.  Eyes: Conjunctivae and EOM are normal. Pupils are equal, round, and reactive to light.  Neck: Neck supple.  Cardiovascular: Normal rate, regular rhythm and normal heart sounds.   Abdominal: Soft. Bowel sounds are normal.  Slightly distended but no significant palpable ascites no significant tenderness objectively. No guarding.  Musculoskeletal: Normal range of motion.  AV fistula in the right arm with good thrill  Neurological: She is alert. No cranial nerve  deficit. She exhibits normal muscle tone. Coordination normal.  Nursing note and vitals reviewed.    ED Treatments / Results  Labs (all labs ordered are listed, but only abnormal results are displayed) Labs Reviewed  COMPREHENSIVE METABOLIC PANEL - Abnormal; Notable for the following:       Result Value   Chloride 94 (*)    Glucose, Bld 308 (*)    Creatinine, Ser 3.92 (*)    Albumin 3.4 (*)    ALT 12 (*)    GFR calc non Af Amer 10 (*)    GFR calc Af Amer 12 (*)    All other  components within normal limits  CBC WITH DIFFERENTIAL/PLATELET - Abnormal; Notable for the following:    RBC 3.86 (*)    All other components within normal limits  PROTIME-INR - Abnormal; Notable for the following:    Prothrombin Time 17.2 (*)    All other components within normal limits  I-STAT CHEM 8, ED - Abnormal; Notable for the following:    Sodium 134 (*)    Chloride 95 (*)    BUN 22 (*)    Creatinine, Ser 3.90 (*)    Glucose, Bld 303 (*)    Calcium, Ion 0.98 (*)    All other components within normal limits  I-STAT CG4 LACTIC ACID, ED - Abnormal; Notable for the following:    Lactic Acid, Venous 3.00 (*)    All other components within normal limits  CULTURE, BLOOD (ROUTINE X 2)  CULTURE, BLOOD (ROUTINE X 2)  URINE CULTURE  LIPASE, BLOOD  CBC WITH DIFFERENTIAL/PLATELET  COMPREHENSIVE METABOLIC PANEL  LACTIC ACID, PLASMA  LACTIC ACID, PLASMA  PROCALCITONIN  PROTIME-INR  APTT  HEMOGLOBIN A1C  URINALYSIS, ROUTINE W REFLEX MICROSCOPIC (NOT AT [facility])  LACTIC ACID, PLASMA  LACTIC ACID, PLASMA  PROCALCITONIN  INFLUENZA PANEL BY PCR (TYPE A & B, H1N1)  AMMONIA  TROPONIN I  TROPONIN I  TROPONIN I  BRAIN NATRIURETIC PEPTIDE  COMPREHENSIVE METABOLIC PANEL  CBC  PROTIME-INR  I-STAT CG4 LACTIC ACID, ED   Results for orders placed or performed during the hospital encounter of 12/06/15  Comprehensive metabolic panel  Result Value Ref Range   Sodium 135 135 - 145 mmol/L   Potassium 3.9 3.5 - 5.1 mmol/L   Chloride 94 (L) 101 - 111 mmol/L   CO2 26 22 - 32 mmol/L   Glucose, Bld 308 (H) 65 - 99 mg/dL   BUN 16 6 - 20 mg/dL   Creatinine, Ser 3.92 (H) 0.44 - 1.00 mg/dL   Calcium 9.2 8.9 - 10.3 mg/dL   Total Protein 7.3 6.5 - 8.1 g/dL   Albumin 3.4 (L) 3.5 - 5.0 g/dL   AST 40 15 - 41 U/L   ALT 12 (L) 14 - 54 U/L   Alkaline Phosphatase 86 38 - 126 U/L   Total Bilirubin 0.8 0.3 - 1.2 mg/dL   GFR calc non Af Amer 10 (L) >60 mL/min   GFR calc Af Amer 12 (L) >60 mL/min    Anion gap 15 5 - 15  Lipase, blood  Result Value Ref Range   Lipase 23 11 - 51 U/L  CBC with Differential/Platelet  Result Value Ref Range   WBC 9.7 4.0 - 10.5 K/uL   RBC 3.86 (L) 3.87 - 5.11 MIL/uL   Hemoglobin 12.6 12.0 - 15.0 g/dL   HCT 38.4 36.0 - 46.0 %   MCV 99.5 78.0 - 100.0 fL   MCH 32.6 26.0 - 34.0  pg   MCHC 32.8 30.0 - 36.0 g/dL   RDW 14.5 11.5 - 15.5 %   Platelets 259 150 - 400 K/uL   Neutrophils Relative % 79 %   Neutro Abs 7.7 1.7 - 7.7 K/uL   Lymphocytes Relative 10 %   Lymphs Abs 0.9 0.7 - 4.0 K/uL   Monocytes Relative 10 %   Monocytes Absolute 1.0 0.1 - 1.0 K/uL   Eosinophils Relative 0 %   Eosinophils Absolute 0.0 0.0 - 0.7 K/uL   Basophils Relative 1 %   Basophils Absolute 0.1 0.0 - 0.1 K/uL  Protime-INR  Result Value Ref Range   Prothrombin Time 17.2 (H) 11.4 - 15.2 seconds   INR 1.39   I-stat Chem 8, ED  Result Value Ref Range   Sodium 134 (L) 135 - 145 mmol/L   Potassium 3.8 3.5 - 5.1 mmol/L   Chloride 95 (L) 101 - 111 mmol/L   BUN 22 (H) 6 - 20 mg/dL   Creatinine, Ser 3.90 (H) 0.44 - 1.00 mg/dL   Glucose, Bld 303 (H) 65 - 99 mg/dL   Calcium, Ion 0.98 (L) 1.15 - 1.40 mmol/L   TCO2 29 0 - 100 mmol/L   Hemoglobin 13.9 12.0 - 15.0 g/dL   HCT 41.0 36.0 - 46.0 %  I-Stat CG4 Lactic Acid, ED  Result Value Ref Range   Lactic Acid, Venous 3.00 (HH) 0.5 - 1.9 mmol/L   Comment NOTIFIED PHYSICIAN      EKG  EKG Interpretation None       Radiology Ct Head Wo Contrast  Result Date: 12/06/2015 CLINICAL DATA:  Increasing confusion and history of recent CVA EXAM: CT HEAD WITHOUT CONTRAST TECHNIQUE: Contiguous axial images were obtained from the base of the skull through the vertex without intravenous contrast. COMPARISON:  None. FINDINGS: Brain: There are areas of parenchymal hemorrhage identified in the right temporo-occipital region as well as in the left temporo-occipital area inferomedially along the tentorium cerebelli. The right-sided area measures  approximately 6.3 cm in greatest dimension. The area on the left measures approximately 5.5 cm greatest dimension. Some superior extension towards the left lateral ventricle is noted with some buckling of the lateral ventricular wall inferiorly although no definitive intraventricular component of hemorrhage is seen. Some linear areas are noted along the inferior aspect of the hemorrhage on the right suggesting the possibility of such as subarachnoid component. Some mild white matter edema is noted in the left occipital lobe related to the hemorrhage. Mild atrophic changes are seen. Vascular: No hyperdense vessel or unexpected calcification. Skull: Normal. Negative for fracture or focal lesion. Sinuses/Orbits: No acute finding. Other: None. IMPRESSION: Changes consistent with parenchymal hemorrhage in the temporo-occipital regions bilaterally. There is some suggestion of subarachnoid extension along the inferior aspect in the right occipital lobe. Critical Value/emergent results were discussed by telephone at the time of interpretation on 12/06/2015 at 4:45 pm with the admitting physician, who verbally acknowledged these results. Electronically Signed   By: Inez Catalina M.D.   On: 12/06/2015 16:46   Ct Abdomen Pelvis W Contrast  Result Date: 12/06/2015 CLINICAL DATA:  Generalized abdominal pain and distention. EXAM: CT ABDOMEN AND PELVIS WITH CONTRAST TECHNIQUE: Multidetector CT imaging of the abdomen and pelvis was performed using the standard protocol following bolus administration of intravenous contrast. CONTRAST:  19mL ISOVUE-300 IOPAMIDOL (ISOVUE-300) INJECTION 61% COMPARISON:  None. FINDINGS: Lower chest: Visualized lung bases are unremarkable. Hepatobiliary: No gallstones are noted. Nodular hepatic margins are noted concerning for hepatic cirrhosis. No  focal abnormality is noted within the liver. Pancreas: Normal. Spleen: Irregular low densities are noted within the spleen which are peripheral and may  represent old infarctions. Adrenals/Urinary Tract: Adrenal glands appear normal. Severe bilateral renal atrophy is noted consistent with history of end-stage renal disease. Stomach/Bowel: There is no evidence of bowel obstruction. Vascular/Lymphatic: Atherosclerosis of abdominal aorta is noted without aneurysm formation. No significant adenopathy is noted. Reproductive: Uterus and ovaries are unremarkable. Other: Large amount of ascites is noted. Probable large fluid-filled left femoral hernia is noted. 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 IMPRESSION: Nodular hepatic margins are noted concerning for hepatic cirrhosis. Large amount of ascites is noted. Irregular peripheral low densities are noted most consistent with old infarctions. Severe bilateral renal atrophy is noted consistent with history of end-stage renal disease. 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 Aortic atherosclerosis. Probable large fluid-filled left femoral hernia is noted. Electronically Signed   By: Marijo Conception, M.D.   On: 12/06/2015 10:50   Dg Chest Port 1 View  Result Date: 12/06/2015 CLINICAL DATA:  Sepsis EXAM: PORTABLE CHEST 1 VIEW COMPARISON:  Chest radiograph from earlier today. FINDINGS: Right rotated chest radiograph. Stable cardiomediastinal silhouette with cardiomegaly and aortic atherosclerosis. No pneumothorax. No pleural effusion. Hazy and linear diffuse parahilar opacities in both lungs appear stable. IMPRESSION: Stable cardiomegaly. Hazy and linear diffuse parahilar opacities in both lungs appear stable and could represent mild pulmonary edema or infection. Electronically Signed   By: Ilona Sorrel M.D.   On: 12/06/2015 15:38   Dg Abd Acute W/chest  Result Date: 12/06/2015 CLINICAL DATA:  Abdominal pain.  Dialysis patient. EXAM: DG ABDOMEN ACUTE W/ 1V CHEST COMPARISON:   None. FINDINGS: Right rotated chest radiograph. Mild enlargement of the cardiac silhouette. Mildly tortuous atherosclerotic thoracic aorta. Otherwise normal mediastinal contour. No pneumothorax. No pleural effusion. Mild pulmonary edema. Mild bibasilar scarring versus atelectasis. No disproportionately dilated small bowel loops. Mild-to-moderate stool and gas throughout the colon. No evidence of pneumatosis or pneumoperitoneum. Extensive atherosclerotic calcifications are noted throughout the abdominopelvic vessels. IMPRESSION: 1. Mild enlargement of the cardiac silhouette. Mild pulmonary edema. 2. Mild scarring versus atelectasis of both lung bases. 3. Nonobstructive bowel gas pattern. 4. Aortic atherosclerosis. Electronically Signed   By: Ilona Sorrel M.D.   On: 12/06/2015 09:08    Procedures Procedures (including critical care time)  CRITICAL CARE Performed by: Fredia Sorrow Total critical care time: 30 minutes Critical care time was exclusive of separately billable procedures and treating other patients. Critical care was necessary to treat or prevent imminent or life-threatening deterioration. Critical care was time spent personally by me on the following activities: development of treatment plan with patient and/or surrogate as well as nursing, discussions with consultants, evaluation of patient's response to treatment, examination of patient, obtaining history from patient or surrogate, ordering and performing treatments and interventions, ordering and review of laboratory studies, ordering and review of radiographic studies, pulse oximetry and re-evaluation of patient's condition.  Medications Ordered in ED Medications  0.9 %  sodium chloride infusion (not administered)  vancomycin (VANCOCIN) 500 mg in sodium chloride 0.9 % 100 mL IVPB (not administered)  piperacillin-tazobactam (ZOSYN) IVPB 3.375 g (not administered)  cinacalcet (SENSIPAR) tablet 60 mg (not administered)  labetalol  (NORMODYNE) tablet 200 mg (not administered)  HYDROcodone-acetaminophen (NORCO/VICODIN) 5-325 MG per tablet 1-2 tablet (not administered)  traZODone (DESYREL) tablet 25 mg (not administered)  senna-docusate (Senokot-S) tablet  1 tablet (not administered)  bisacodyl (DULCOLAX) suppository 10 mg (not administered)  magnesium citrate solution 1 Bottle (not administered)  ondansetron (ZOFRAN) tablet 4 mg (not administered)    Or  ondansetron (ZOFRAN) injection 4 mg (not administered)  sodium chloride flush (NS) 0.9 % injection 3 mL (not administered)  insulin aspart (novoLOG) injection 0-9 Units (not administered)  aspirin suppository 300 mg (300 mg Rectal Given 12/06/15 1556)  iopamidol (ISOVUE-300) 61 % injection (100 mLs  Contrast Given 12/06/15 1011)  piperacillin-tazobactam (ZOSYN) IVPB 3.375 g (0 g Intravenous Stopped 12/06/15 1509)  vancomycin (VANCOCIN) IVPB 1000 mg/200 mL premix (0 mg Intravenous Stopped 12/06/15 1614)  ketorolac (TORADOL) 15 MG/ML injection 15 mg (15 mg Intravenous Given 12/06/15 1506)     Initial Impression / Assessment and Plan / ED Course  I have reviewed the triage vital signs and the nursing notes.  Pertinent labs & imaging results that were available during my care of the patient were reviewed by me and considered in my medical decision making (see chart for details).  Clinical Course   Patient brought in by family members for concerns for abdominal pain. Initial workup was for that. Patient has a known history of renal failure is a dialysis patient. Normally dialyzed Tuesdays Thursdays and Saturdays. Was dialyzed yesterday. Patient also has a long-standing history of renal failure. Patient known to have ascites. Patient's abdomen on presentation was soft and did not seem to be particularly tender. Family stated that patient drinks sorbitol every 3 days because she thinks she gets constipated all the time. Thought that's what maybe was going on.  Workup here  without any significant findings based on labs and also CT of the abdomen chest x-ray was negative so patient was given be discharged home. Initially she had no fever presentation. Patient seen to be developing chills so temperature was rechecked orally it was 103 rectally was 101. Based on this patient had blood cultures done atelectatic acid drawn which was 3 patient was never hypotensive. Patient started on broad-spectrum antibiotics. No source of infection evident. Patient received Zosyn and vancomycin. Patient was not fluid resuscitated but also was not hypotensive. As discussed with the admitting team. Can never completely rule out spontaneous bacterial peritonitis but exam is not very impressive.  Discussed with admitting teams patient will be admitted to the stepdown unit.   Final Clinical Impressions(s) / ED Diagnoses   Final diagnoses:  Fever, unspecified fever cause  Sepsis [facility])    New Prescriptions New Prescriptions   No medications on file     Fredia Sorrow, MD 12/06/15 1719

## 2015-12-06 NOTE — ED Triage Notes (Signed)
Pt BIB [facility]. EMS called to pt home for unknown complaint. Pt and family entirely spanish speaking. Pt. Found in bed, clutching abd intermittently. Abd is distended. Pt. Has dialysis graft to L arm.

## 2015-12-06 NOTE — Consult Note (Signed)
Reason for Consult: Intracerebral hemorrhage Referring Physician: Dr. Bonnita Levan SANYA KOBRIN is an 75 y.o. female.  HPI: The patient is a 75 year old unhealthy Hispanic female with a history of end-stage renal disease, ascites, hepatic disease, etc. According to the patient's family the patient became febrile and stop talking at approximately noon today. She was brought to the ER and worked up with a head CT which demonstrated a lateral intracerebral hemorrhages. A neurosurgical consultation was requested by Dr. Barbaraann Faster.  Presently the patient is accompanied by multiple family members who provide the details.  History reviewed. No pertinent past medical history.  History reviewed. No pertinent surgical history.  History reviewed. No pertinent family history.  Social History:  reports that she has never smoked. She has never used smokeless tobacco. Her alcohol and drug histories are not on file.  Allergies: Not on File  Medications:  I have reviewed the patient's current medications. Prior to Admission:  (Not in a hospital admission) Scheduled: . [START ON 12/07/2015] cinacalcet  60 mg Oral Daily  . insulin aspart  0-9 Units Subcutaneous TID WC  . [START ON 12/07/2015] labetalol  200 mg Oral BID  . sodium chloride flush  3 mL Intravenous Q12H  . [START ON 12/07/2015] vancomycin  500 mg Intravenous Q T,Th,Sa-HD   Continuous: . sodium chloride    . [START ON 12/07/2015] piperacillin-tazobactam (ZOSYN)  IV     TMH:DQQIWLN, bisacodyl, HYDROcodone-acetaminophen, magnesium citrate, ondansetron **OR** ondansetron (ZOFRAN) IV, senna-docusate, traZODone Anti-infectives    Start     Dose/Rate Route Frequency Ordered Stop   12/08/15 1400  vancomycin (VANCOCIN) IVPB 750 mg/150 ml premix  Status:  Discontinued     750 mg 150 mL/hr over 60 Minutes Intravenous Every 48 hours 12/06/15 1409 12/06/15 1411   12/07/15 1200  vancomycin (VANCOCIN) 500 mg in sodium chloride 0.9 % 100 mL IVPB      500 mg 100 mL/hr over 60 Minutes Intravenous Every T-Th-Sa (Hemodialysis) 12/06/15 1428     12/07/15 0200  piperacillin-tazobactam (ZOSYN) IVPB 3.375 g     3.375 g 12.5 mL/hr over 240 Minutes Intravenous Every 12 hours 12/06/15 1428     12/06/15 2200  piperacillin-tazobactam (ZOSYN) IVPB 2.25 g  Status:  Discontinued     2.25 g 100 mL/hr over 30 Minutes Intravenous Every 8 hours 12/06/15 1409 12/06/15 1411   12/06/15 1400  piperacillin-tazobactam (ZOSYN) IVPB 3.375 g     3.375 g 100 mL/hr over 30 Minutes Intravenous  Once 12/06/15 1358 12/06/15 1509   12/06/15 1400  vancomycin (VANCOCIN) IVPB 1000 mg/200 mL premix     1,000 mg 200 mL/hr over 60 Minutes Intravenous  Once 12/06/15 1358 12/06/15 1614       Results for orders placed or performed during the hospital encounter of 12/06/15 (from the past 48 hour(s))  Comprehensive metabolic panel     Status: Abnormal   Collection Time: 12/06/15  7:52 AM  Result Value Ref Range   Sodium 135 135 - 145 mmol/L   Potassium 3.9 3.5 - 5.1 mmol/L    Comment: SLIGHT HEMOLYSIS   Chloride 94 (L) 101 - 111 mmol/L   CO2 26 22 - 32 mmol/L   Glucose, Bld 308 (H) 65 - 99 mg/dL   BUN 16 6 - 20 mg/dL   Creatinine, Ser 3.92 (H) 0.44 - 1.00 mg/dL   Calcium 9.2 8.9 - 10.3 mg/dL   Total Protein 7.3 6.5 - 8.1 g/dL   Albumin 3.4 (L) 3.5 - 5.0 g/dL  AST 40 15 - 41 U/L   ALT 12 (L) 14 - 54 U/L   Alkaline Phosphatase 86 38 - 126 U/L   Total Bilirubin 0.8 0.3 - 1.2 mg/dL   GFR calc non Af Amer 10 (L) >60 mL/min   GFR calc Af Amer 12 (L) >60 mL/min    Comment: (NOTE) The eGFR has been calculated using the CKD EPI equation. This calculation has not been validated in all clinical situations. eGFR's persistently <60 mL/min signify possible Chronic Kidney Disease.    Anion gap 15 5 - 15  Lipase, blood     Status: None   Collection Time: 12/06/15  7:52 AM  Result Value Ref Range   Lipase 23 11 - 51 U/L  CBC with Differential/Platelet     Status: Abnormal    Collection Time: 12/06/15  7:52 AM  Result Value Ref Range   WBC 9.7 4.0 - 10.5 K/uL   RBC 3.86 (L) 3.87 - 5.11 MIL/uL   Hemoglobin 12.6 12.0 - 15.0 g/dL   HCT 38.4 36.0 - 46.0 %   MCV 99.5 78.0 - 100.0 fL   MCH 32.6 26.0 - 34.0 pg   MCHC 32.8 30.0 - 36.0 g/dL   RDW 14.5 11.5 - 15.5 %   Platelets 259 150 - 400 K/uL   Neutrophils Relative % 79 %   Neutro Abs 7.7 1.7 - 7.7 K/uL   Lymphocytes Relative 10 %   Lymphs Abs 0.9 0.7 - 4.0 K/uL   Monocytes Relative 10 %   Monocytes Absolute 1.0 0.1 - 1.0 K/uL   Eosinophils Relative 0 %   Eosinophils Absolute 0.0 0.0 - 0.7 K/uL   Basophils Relative 1 %   Basophils Absolute 0.1 0.0 - 0.1 K/uL  Protime-INR     Status: Abnormal   Collection Time: 12/06/15  7:52 AM  Result Value Ref Range   Prothrombin Time 17.2 (H) 11.4 - 15.2 seconds   INR 1.39   I-stat Chem 8, ED     Status: Abnormal   Collection Time: 12/06/15  8:16 AM  Result Value Ref Range   Sodium 134 (L) 135 - 145 mmol/L   Potassium 3.8 3.5 - 5.1 mmol/L   Chloride 95 (L) 101 - 111 mmol/L   BUN 22 (H) 6 - 20 mg/dL   Creatinine, Ser 3.90 (H) 0.44 - 1.00 mg/dL   Glucose, Bld 303 (H) 65 - 99 mg/dL   Calcium, Ion 0.98 (L) 1.15 - 1.40 mmol/L   TCO2 29 0 - 100 mmol/L   Hemoglobin 13.9 12.0 - 15.0 g/dL   HCT 41.0 36.0 - 46.0 %  I-Stat CG4 Lactic Acid, ED     Status: Abnormal   Collection Time: 12/06/15  1:22 PM  Result Value Ref Range   Lactic Acid, Venous 3.00 (HH) 0.5 - 1.9 mmol/L   Comment NOTIFIED PHYSICIAN   Protime-INR     Status: Abnormal   Collection Time: 12/06/15  3:32 PM  Result Value Ref Range   Prothrombin Time 18.8 (H) 11.4 - 15.2 seconds   INR 1.56   APTT     Status: None   Collection Time: 12/06/15  3:32 PM  Result Value Ref Range   aPTT 24 24 - 36 seconds  Ammonia     Status: Abnormal   Collection Time: 12/06/15  4:05 PM  Result Value Ref Range   Ammonia 70 (H) 9 - 35 umol/L    Ct Head Wo Contrast  Result Date: 12/06/2015 CLINICAL DATA:  Increasing  confusion and history of recent CVA EXAM: CT HEAD WITHOUT CONTRAST TECHNIQUE: Contiguous axial images were obtained from the base of the skull through the vertex without intravenous contrast. COMPARISON:  None. FINDINGS: Brain: There are areas of parenchymal hemorrhage identified in the right temporo-occipital region as well as in the left temporo-occipital area inferomedially along the tentorium cerebelli. The right-sided area measures approximately 6.3 cm in greatest dimension. The area on the left measures approximately 5.5 cm greatest dimension. Some superior extension towards the left lateral ventricle is noted with some buckling of the lateral ventricular wall inferiorly although no definitive intraventricular component of hemorrhage is seen. Some linear areas are noted along the inferior aspect of the hemorrhage on the right suggesting the possibility of such as subarachnoid component. Some mild white matter edema is noted in the left occipital lobe related to the hemorrhage. Mild atrophic changes are seen. Vascular: No hyperdense vessel or unexpected calcification. Skull: Normal. Negative for fracture or focal lesion. Sinuses/Orbits: No acute finding. Other: None. IMPRESSION: Changes consistent with parenchymal hemorrhage in the temporo-occipital regions bilaterally. There is some suggestion of subarachnoid extension along the inferior aspect in the right occipital lobe. Critical Value/emergent results were discussed by telephone at the time of interpretation on 12/06/2015 at 4:45 pm with the admitting physician, who verbally acknowledged these results. Electronically Signed   By: Inez Catalina M.D.   On: 12/06/2015 16:46   Ct Abdomen Pelvis W Contrast  Result Date: 12/06/2015 CLINICAL DATA:  Generalized abdominal pain and distention. EXAM: CT ABDOMEN AND PELVIS WITH CONTRAST TECHNIQUE: Multidetector CT imaging of the abdomen and pelvis was performed using the standard protocol following bolus  administration of intravenous contrast. CONTRAST:  112m ISOVUE-300 IOPAMIDOL (ISOVUE-300) INJECTION 61% COMPARISON:  None. FINDINGS: Lower chest: Visualized lung bases are unremarkable. Hepatobiliary: No gallstones are noted. Nodular hepatic margins are noted concerning for hepatic cirrhosis. No focal abnormality is noted within the liver. Pancreas: Normal. Spleen: Irregular low densities are noted within the spleen which are peripheral and may represent old infarctions. Adrenals/Urinary Tract: Adrenal glands appear normal. Severe bilateral renal atrophy is noted consistent with history of end-stage renal disease. Stomach/Bowel: There is no evidence of bowel obstruction. Vascular/Lymphatic: Atherosclerosis of abdominal aorta is noted without aneurysm formation. No significant adenopathy is noted. Reproductive: Uterus and ovaries are unremarkable. Other: Large amount of ascites is noted. Probable large fluid-filled left femoral hernia is noted. 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 IMPRESSION: Nodular hepatic margins are noted concerning for hepatic cirrhosis. Large amount of ascites is noted. Irregular peripheral low densities are noted most consistent with old infarctions. Severe bilateral renal atrophy is noted consistent with history of end-stage renal disease. 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 Aortic atherosclerosis. Probable large fluid-filled left femoral hernia is noted. Electronically Signed   By: JMarijo Conception M.D.   On: 12/06/2015 10:50   Dg Chest Port 1 View  Result Date: 12/06/2015 CLINICAL DATA:  Sepsis EXAM: PORTABLE CHEST 1 VIEW COMPARISON:  Chest radiograph from earlier today. FINDINGS: Right rotated chest radiograph. Stable cardiomediastinal silhouette with cardiomegaly and aortic atherosclerosis. No pneumothorax. No pleural effusion. Hazy  and linear diffuse parahilar opacities in both lungs appear stable. IMPRESSION: Stable cardiomegaly. Hazy and linear diffuse parahilar opacities in both lungs appear stable and could represent mild pulmonary edema or infection. Electronically Signed   By: JIlona SorrelM.D.   On: 12/06/2015  15:38   Dg Abd Acute W/chest  Result Date: 12/06/2015 CLINICAL DATA:  Abdominal pain.  Dialysis patient. EXAM: DG ABDOMEN ACUTE W/ 1V CHEST COMPARISON:  None. FINDINGS: Right rotated chest radiograph. Mild enlargement of the cardiac silhouette. Mildly tortuous atherosclerotic thoracic aorta. Otherwise normal mediastinal contour. No pneumothorax. No pleural effusion. Mild pulmonary edema. Mild bibasilar scarring versus atelectasis. No disproportionately dilated small bowel loops. Mild-to-moderate stool and gas throughout the colon. No evidence of pneumatosis or pneumoperitoneum. Extensive atherosclerotic calcifications are noted throughout the abdominopelvic vessels. IMPRESSION: 1. Mild enlargement of the cardiac silhouette. Mild pulmonary edema. 2. Mild scarring versus atelectasis of both lung bases. 3. Nonobstructive bowel gas pattern. 4. Aortic atherosclerosis. Electronically Signed   By: Ilona Sorrel M.D.   On: 12/06/2015 09:08    ROS: Unobtainable Blood pressure (!) 114/52, pulse 77, temperature 100.1 F (37.8 C), temperature source Axillary, resp. rate 22, weight 53 kg (116 lb 13.5 oz), SpO2 99 %. Physical Exam  General: A somnolent but easily arousable unhealthy appearing 75 year old Hispanic female in no apparent distress  HEENT: Normocephalic, her pupils are equal. She does not cooperate with extraocular muscle exam.  Neck: Supple  Abdomen protuberant  Thorax: Symmetric  Neurologic exam: The patient is somnolent but arousable. She occasionally opens her eyes to voice. She does not follow commands but does move all 4 extremities. Cranial nerve exam is limited.  I reviewed the patient's head CT  performed at [facility] today. She has a left medial occipital and right posterior temporal occipital hyperdensity consistent with intracerebral hemorrhage. Neither of which have significant mass effect.  Assessment/Plan: Bilateral intracerebral hemorrhages: Perhaps this is secondary to her hypertension or coagulopathy. Either way she does not need surgery. I would suggest repeating her CAT scan tomorrow. She does not appear to be a very good surgical candidate.  Renal failure, hepatic cirrhosis, sepsis, etc.: Noted this is being managed by the medical team.  I have spoken with the patient's family and answered all their questions.  Alba Kriesel D 12/06/2015, 6:06 PM

## 2015-12-06 NOTE — ED Notes (Signed)
EDP made aware patient does not make urine. UA and culture canceled at this time.

## 2015-12-06 NOTE — Progress Notes (Signed)
[facility] Progress Note Patient Name: [name] DOB: Aug 01, 1940 MRN: [id number]   Date of Service  12/06/2015  HPI/Events of Note  Pt was to have Paracentesis to r/o SBP by IR, but they were not able to do today. Request to see if CCM could do this. Pt likely to be made comfort measures in AM.    eICU Interventions  Discussed with hospitalist, recommend starting empiric treatment for SBP until IR can perform paracentesis tomorrow.         Laverle Hobby 12/06/2015, 7:54 PM

## 2015-12-06 NOTE — Progress Notes (Signed)
Labs show ammonia level of 70.  After IVF and antibiotics initiated her lactic acid is 2.6 However, her mental status has worsened. Patient continues to show confusion. Dr. Marily Memos had a lengthy discussion with the extensive family members regarding prognosis.  Options are to provide aggressive therapy over the night which includes lactulose enema, IVF, Antibiotics, with plans for repeat CT head in am to monitor progression of her cerebral hemorrhage  Versus comfort care only.  At this point, the family is keen to proceed with aggressive treatment.  Regarding her code status, this is being discussed by all the family members. They were informed is unclear if the patient is to survive thought the night despite all the medical efforts  Dr. Marily Memos explained to them that, in the case that she did respond to the metabolic abnormalities management, we are not confident about the long term effects of her cerebral hemorrhage. Neurosurgery was consulted by phone, who deem patient not to be a surgical candidate. We are awaiting the final decision regarding aggressive versus comfort only measures   Terald Jump E 12/06/2015 6:43 PM

## 2015-12-06 NOTE — ED Notes (Signed)
Patient transported to CT 

## 2015-12-06 NOTE — ED Notes (Signed)
Pt. Unable to tolerate pills or liquid ibuprofen at this time. EDP made aware and states we will hold off for now.

## 2015-12-06 NOTE — ED Notes (Signed)
Pt. BP dropping. MAP of 60. Admitting provider made aware.

## 2015-12-06 NOTE — ED Notes (Signed)
Patient transported to X-ray 

## 2015-12-07 ENCOUNTER — Inpatient Hospital Stay (HOSPITAL_COMMUNITY): Payer: Medicare Other

## 2015-12-07 DIAGNOSIS — I619 Nontraumatic intracerebral hemorrhage, unspecified: Secondary | ICD-10-CM

## 2015-12-07 LAB — CBC
HCT: 36.7 % (ref 36.0–46.0)
Hemoglobin: 12.1 g/dL (ref 12.0–15.0)
MCH: 32.5 pg (ref 26.0–34.0)
MCHC: 33 g/dL (ref 30.0–36.0)
MCV: 98.7 fL (ref 78.0–100.0)
PLATELETS: 215 10*3/uL (ref 150–400)
RBC: 3.72 MIL/uL — AB (ref 3.87–5.11)
RDW: 14.5 % (ref 11.5–15.5)
WBC: 9.9 10*3/uL (ref 4.0–10.5)

## 2015-12-07 LAB — COMPREHENSIVE METABOLIC PANEL
ALK PHOS: 70 U/L (ref 38–126)
ALT: 10 U/L — AB (ref 14–54)
AST: 32 U/L (ref 15–41)
Albumin: 2.9 g/dL — ABNORMAL LOW (ref 3.5–5.0)
Anion gap: 14 (ref 5–15)
BUN: 27 mg/dL — ABNORMAL HIGH (ref 6–20)
CALCIUM: 9.8 mg/dL (ref 8.9–10.3)
CO2: 27 mmol/L (ref 22–32)
CREATININE: 5.51 mg/dL — AB (ref 0.44–1.00)
Chloride: 97 mmol/L — ABNORMAL LOW (ref 101–111)
GFR, EST AFRICAN AMERICAN: 8 mL/min — AB (ref >60)
GFR, EST NON AFRICAN AMERICAN: 7 mL/min — AB (ref >60)
Glucose, Bld: 150 mg/dL — ABNORMAL HIGH (ref 65–99)
Potassium: 3.7 mmol/L (ref 3.5–5.1)
Sodium: 138 mmol/L (ref 135–145)
TOTAL PROTEIN: 6.8 g/dL (ref 6.5–8.1)
Total Bilirubin: 0.9 mg/dL (ref 0.3–1.2)

## 2015-12-07 LAB — GLUCOSE, CAPILLARY
GLUCOSE-CAPILLARY: 143 mg/dL — AB (ref 65–99)
GLUCOSE-CAPILLARY: 166 mg/dL — AB (ref 65–99)
Glucose-Capillary: 144 mg/dL — ABNORMAL HIGH (ref 65–99)

## 2015-12-07 LAB — GRAM STAIN

## 2015-12-07 LAB — PROTEIN, BODY FLUID: Total protein, fluid: 5 g/dL

## 2015-12-07 LAB — BODY FLUID CELL COUNT WITH DIFFERENTIAL
Eos, Fluid: 0 %
Lymphs, Fluid: 2 %
Monocyte-Macrophage-Serous Fluid: 4 % — ABNORMAL LOW (ref 50–90)
NEUTROPHIL FLUID: 94 % — AB (ref 0–25)
WBC FLUID: 5800 uL — AB (ref 0–1000)

## 2015-12-07 LAB — ALBUMIN, FLUID (OTHER): ALBUMIN FL: 2.5 g/dL

## 2015-12-07 LAB — TROPONIN I: Troponin I: 2.17 ng/mL (ref <0.03)

## 2015-12-07 LAB — GLUCOSE, SEROUS FLUID: GLUCOSE FL: 122 mg/dL

## 2015-12-07 LAB — LACTATE DEHYDROGENASE, PLEURAL OR PERITONEAL FLUID: LD, Fluid: 223 U/L — ABNORMAL HIGH (ref 3–23)

## 2015-12-07 LAB — PROTIME-INR
INR: 2.01
PROTHROMBIN TIME: 23 s — AB (ref 11.4–15.2)

## 2015-12-07 MED ORDER — LIDOCAINE HCL 1 % IJ SOLN
INTRAMUSCULAR | Status: AC
  Filled 2015-12-07: qty 20

## 2015-12-07 MED ORDER — ACETAMINOPHEN 120 MG RE SUPP
120.0000 mg | RECTAL | Status: DC | PRN
  Administered 2015-12-08: 120 mg via RECTAL
  Filled 2015-12-07 (×3): qty 1

## 2015-12-07 MED ORDER — VANCOMYCIN HCL 500 MG IV SOLR
500.0000 mg | INTRAVENOUS | Status: DC
  Filled 2015-12-07: qty 500

## 2015-12-07 NOTE — Progress Notes (Signed)
Patient ID: [name], female   DOB: [birth year], 75 y.o.   MRN: [id number] Subjective:  Patient is without change from yesterday. Her family is at the bedside.  Objective: Vital signs in last 24 hours: Temp:  [98.8 F (37.1 C)-103 F (39.4 C)] 101.8 F (38.8 C) (10/26 0535) Pulse Rate:  [72-88] 72 (10/26 0535) Resp:  [12-31] 12 (10/26 0535) BP: (97-205)/(42-82) 120/60 (10/26 0535) SpO2:  [90 %-100 %] 92 % (10/26 0535) Weight:  [53 kg (116 lb 13.5 oz)] 53 kg (116 lb 13.5 oz) (10/25 1544)  Intake/Output from previous day: 10/25 0701 - 10/26 0700 In: 450 [I.V.:450] Out: -  Intake/Output this shift: No intake/output data recorded.  Physical exam the patient is unchanged neurologically from yesterday.  I have reviewed the patient's follow-up head CT performed today. It doesn't demonstrate any significant change in her bilateral posterior hemorrhagic infarctions. There is no significant mass effect.  Lab Results:  Recent Labs  12/06/15 0752 12/06/15 0816 12/07/15 0416  WBC 9.7  --  9.9  HGB 12.6 13.9 12.1  HCT 38.4 41.0 36.7  PLT 259  --  215   BMET  Recent Labs  12/06/15 0752 12/06/15 0816 12/07/15 0416  NA 135 134* 138  K 3.9 3.8 3.7  CL 94* 95* 97*  CO2 26  --  27  GLUCOSE 308* 303* 150*  BUN 16 22* 27*  CREATININE 3.92* 3.90* 5.51*  CALCIUM 9.2  --  9.8    Studies/Results: Ct Head Wo Contrast  Result Date: 12/07/2015 CLINICAL DATA:  Intracranial hemorrhage, multi organ failure. EXAM: CT HEAD WITHOUT CONTRAST TECHNIQUE: Contiguous axial images were obtained from the base of the skull through the vertex without intravenous contrast. COMPARISON:  CT HEAD December 06, 2014 FINDINGS: BRAIN: Moderate ventriculomegaly on the basis of global parenchymal brain volume loss. Similar LEFT temporal occipital lobe 5.9 x 2.4 cm intraparenchymal hematoma with surrounding low-density vasogenic edema. New blurring of the LEFT medial occipital lobe and LEFT thalamus  gray-white matter junction, contiguous with the posterior aspect of the hematoma. Wedge-like hypodensity RIGHT superior cerebellum. Evolving RIGHT temporal occipital lobe intraparenchymal hematoma. Local sulcal effacement without midline shift. Gyral density RIGHT and appear temporal occipital lobe. Patchy to confluent white matter hypodensities exclusive of the aforementioned abnormality compatible with moderate chronic small vessel ischemic disease. Old LEFT basal ganglia lacunar infarcts. Scattered sub cm parenchymal calcifications can be seen with prior infection such as neurocysticercosis. No abnormal extra-axial fluid collections. Basal cisterns are patent. VASCULAR: Severe calcific atherosclerosis of the carotid siphons and included vertebral arteries. Scalp vascular calcifications. Dense basilar artery and to lesser extent remaining intracranial vessels. SKULL: No skull fracture. No significant scalp soft tissue swelling. SINUSES/ORBITS: The mastoid air-cells and included paranasal sinuses are well-aerated.Status post RIGHT ocular lens implant. The included ocular globes and orbital contents are non-suspicious. OTHER: Patient is edentulous. 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 Acute RIGHT superior cerebellar artery territory infarct. Evolving bilateral temporal occipital lobe intraparenchymal hematomas. Severe atherosclerosis. Dense basilar artery and to lesser extent additional intracranial vessels most compatible with hemoconcentration though, basilar artery thrombosis is a concern given acute posterior circulation infarcts. These results will be called to the ordering clinician or representative by the Radiologist Assistant, and communication documented in the PACS or zVision Dashboard. Electronically Signed   By: Elon Alas M.D.   On: 12/07/2015 06:49   Ct Head Wo Contrast  Result Date: 12/06/2015 CLINICAL DATA:  Increasing confusion and  history of recent CVA EXAM: CT HEAD WITHOUT CONTRAST TECHNIQUE: Contiguous axial images were obtained from the base of the skull through the vertex without intravenous contrast. COMPARISON:  None. FINDINGS: Brain: There are areas of parenchymal hemorrhage identified in the right temporo-occipital region as well as in the left temporo-occipital area inferomedially along the tentorium cerebelli. The right-sided area measures approximately 6.3 cm in greatest dimension. The area on the left measures approximately 5.5 cm greatest dimension. Some superior extension towards the left lateral ventricle is noted with some buckling of the lateral ventricular wall inferiorly although no definitive intraventricular component of hemorrhage is seen. Some linear areas are noted along the inferior aspect of the hemorrhage on the right suggesting the possibility of such as subarachnoid component. Some mild white matter edema is noted in the left occipital lobe related to the hemorrhage. Mild atrophic changes are seen. Vascular: No hyperdense vessel or unexpected calcification. Skull: Normal. Negative for fracture or focal lesion. Sinuses/Orbits: No acute finding. Other: None. IMPRESSION: Changes consistent with parenchymal hemorrhage in the temporo-occipital regions bilaterally. There is some suggestion of subarachnoid extension along the inferior aspect in the right occipital lobe. Critical Value/emergent results were discussed by telephone at the time of interpretation on 12/06/2015 at 4:45 pm with the admitting physician, who verbally acknowledged these results. Electronically Signed   By: Inez Catalina M.D.   On: 12/06/2015 16:46   Ct Abdomen Pelvis W Contrast  Result Date: 12/06/2015 CLINICAL DATA:  Generalized abdominal pain and distention. EXAM: CT ABDOMEN AND PELVIS WITH CONTRAST TECHNIQUE: Multidetector CT imaging of the abdomen and pelvis was performed using the standard protocol following bolus administration of  intravenous contrast. CONTRAST:  152mL ISOVUE-300 IOPAMIDOL (ISOVUE-300) INJECTION 61% COMPARISON:  None. FINDINGS: Lower chest: Visualized lung bases are unremarkable. Hepatobiliary: No gallstones are noted. Nodular hepatic margins are noted concerning for hepatic cirrhosis. No focal abnormality is noted within the liver. Pancreas: Normal. Spleen: Irregular low densities are noted within the spleen which are peripheral and may represent old infarctions. Adrenals/Urinary Tract: Adrenal glands appear normal. Severe bilateral renal atrophy is noted consistent with history of end-stage renal disease. Stomach/Bowel: There is no evidence of bowel obstruction. Vascular/Lymphatic: Atherosclerosis of abdominal aorta is noted without aneurysm formation. No significant adenopathy is noted. Reproductive: Uterus and ovaries are unremarkable. Other: Large amount of ascites is noted. Probable large fluid-filled left femoral hernia is noted. 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 IMPRESSION: Nodular hepatic margins are noted concerning for hepatic cirrhosis. Large amount of ascites is noted. Irregular peripheral low densities are noted most consistent with old infarctions. Severe bilateral renal atrophy is noted consistent with history of end-stage renal disease. 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 Aortic atherosclerosis. Probable large fluid-filled left femoral hernia is noted. Electronically Signed   By: Marijo Conception, M.D.   On: 12/06/2015 10:50   Dg Chest Port 1 View  Result Date: 12/06/2015 CLINICAL DATA:  Sepsis EXAM: PORTABLE CHEST 1 VIEW COMPARISON:  Chest radiograph from earlier today. FINDINGS: Right rotated chest radiograph. Stable cardiomediastinal silhouette with cardiomegaly and aortic atherosclerosis. No pneumothorax. No pleural effusion. Hazy and linear diffuse  parahilar opacities in both lungs appear stable. IMPRESSION: Stable cardiomegaly. Hazy and linear diffuse parahilar opacities in both lungs appear stable and could represent mild pulmonary edema or infection. Electronically Signed   By: Ilona Sorrel M.D.   On: 12/06/2015 15:38   Dg  Abd Acute W/chest  Result Date: 12/06/2015 CLINICAL DATA:  Abdominal pain.  Dialysis patient. EXAM: DG ABDOMEN ACUTE W/ 1V CHEST COMPARISON:  None. FINDINGS: Right rotated chest radiograph. Mild enlargement of the cardiac silhouette. Mildly tortuous atherosclerotic thoracic aorta. Otherwise normal mediastinal contour. No pneumothorax. No pleural effusion. Mild pulmonary edema. Mild bibasilar scarring versus atelectasis. No disproportionately dilated small bowel loops. Mild-to-moderate stool and gas throughout the colon. No evidence of pneumatosis or pneumoperitoneum. Extensive atherosclerotic calcifications are noted throughout the abdominopelvic vessels. IMPRESSION: 1. Mild enlargement of the cardiac silhouette. Mild pulmonary edema. 2. Mild scarring versus atelectasis of both lung bases. 3. Nonobstructive bowel gas pattern. 4. Aortic atherosclerosis. Electronically Signed   By: Ilona Sorrel M.D.   On: 12/06/2015 09:08    Assessment/Plan: Bilateral intracerebral hemorrhages/hemorrhagic infarctions: The patient does not need surgery. Furthermore given her all her medical issues she is not a very good surgical candidate. I discussed this with the family and recommended medical management of her multiple medical issues. I don't think I have much to offer surgically and I will sign off. Please call if I can be of further assistance.  LOS: 1 day     Billy Turvey D 12/07/2015, 9:48 AM

## 2015-12-07 NOTE — Progress Notes (Signed)
Nutrition Brief Note  Patient identified due to Low Braden Score.   Wt Readings from Last 15 Encounters:  12/06/15 116 lb 13.5 oz (53 kg)     Current diet order is Heart Healthy, patient is consuming approximately 0% of meals at this time. Per RN, pt has been completely unresponsive today. Per chart, plan for palliative meeting in the morning. RD will follow-up for goals of care and to re-assess nutrition needs. Labs and medications reviewed.   No nutrition interventions warranted at this time. If nutrition issues arise, please consult RD.   Scarlette Ar RD, CSP, LDN Inpatient Clinical Dietitian Pager: [phone] After Hours Pager: [phone]

## 2015-12-07 NOTE — Progress Notes (Addendum)
Patient ID: [name], female   DOB: 1940/10/06, 75 y.o.   MRN: [id number]  PROGRESS NOTE    [name]  WVP:[id number] DOB: 09/03/1940 DOA: 12/06/2015  PCP: No primary care provider on file.   Brief Narrative:  75 y.o. female with past medical history significant for end-stage renal disease on hemodialysis [facility]), recent hospitalization for CVA (discharged on 11/23/2015 on aspirin), hypertension. Patient was apparently fine the night prior to this admission, walking and talking and then suddenly started to become more altered, confused for which reason the family brought her in for evaluation.  On admission, patient was febrile with T max 102.5 F, RR 27, HR 84 and stable BP. Blood work was notable for normal hemoglobin, creatinine of 3.92, glucose 308 and lactic acid of 3. Abdominal x-ray showed nonobstructive bowel gas pattern. Chest x-ray showed stable cardiomegaly with hazy and linear diffuse parahilar opacities in both lungs which seemed to be stable compared to prior study. CT abdomen showed hepatic cirrhosis with large amount of ascites. CT head showed parenchymal hemorrhage in the temporalis occipital regions bilaterally with possibly subarachnoid extension along the inferior aspect in the right occipital lobe. Dr. Arnoldo Morale off neurosurgery saw the patient in consultation. Per neurosurgery, patient does not need surgery, given her other medical issues she is not a very good surgical candidate.  Assessment & Plan:   Principal Problem: Acute hepatic encephalopathy / bilateral intracerebral hemorrhage / hemorrhagic infarctions - CT head on the admission showed parenchymal hemorrhage in the temporalis occipital regions bilaterally with possibly subarachnoid extension along the inferior aspect in the right occipital lobe - Ammonia level 70 on admission  - Per neurosurgery, pt is not a good surgical candidate due to all other medical conditions and they signed off  Active  Problems: Sepsis, unspecified organism - Sepsis criteria met on admission with her, tachycardia, acute, hypotension, lactic acidosis. In addition broke calcitonin level is elevated at 12.67. - Urinalysis was not obtained on the admission and patient is currently on antibiotics so not sure if it would be useful to collect urinalysis at this point - Of obvious concern is possible spontaneous bacterial peritonitis considering that patient has large amount of ascites. She is not able to communicate if she has abdominal pain. 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 I think at this time, it is reasonable to obtain palliative care consultation for goals of care.    DVT prophylaxis: SCDs for DVT prophylaxis Code Status: DNR/DNI  Family Communication: Multiple family members in the room Disposition Plan: Patient is currently hospitalized for sepsis, on IV antibiotics. Not medically stable for discharge at this time. She needs renal and palliative care consultation.   Consultants:   Renal  PCT  Procedures:   Paracentesis planned for today   Antimicrobials:   Vancomycin  and Zosyn 12/06/2015   Subjective: Had episode of blood with bowel  movement.  Objective: Vitals:   12/07/15 0100 12/07/15 0200 12/07/15 0535 12/07/15 1009  BP: (!) 144/78 140/70 120/60 (!) 116/40  Pulse: 77  72 77  Resp: 16 18 12 16   Temp: (!) 102.1 F (38.9 C) 99 F (37.2 C) (!) 101.8 F (38.8 C) (!) 101.3 F (38.5 C)  TempSrc: Rectal Rectal Rectal Axillary  SpO2: 94%  92% 98%  Weight:        Intake/Output Summary (Last 24 hours) at 12/07/15 1109 Last data filed at 12/06/15 1614  Gross per 24 hour  Intake              450 ml  Output                0 ml  Net              450 ml   Filed Weights   12/06/15 1544  Weight: 53 kg (116 lb 13.5 oz)    Examination:  General exam: Appears calm and comfortable, no distress  Respiratory system: Clear to auscultation. Respiratory effort normal. Cardiovascular system: S1 & S2 heard, rate controlled  Gastrointestinal system: Abdomen is distended, fluid wave (+), non tender  Central nervous system: lethargic, opens eyes briefly to painful stimuli Extremities: No tenderness or cyanosis  Skin: No rashes, lesions or ulcers Psychiatry: unable to assess since pt not alert enough    Data Reviewed: I have personally reviewed following labs and imaging studies  CBC:  Recent Labs Lab 12/06/15 0752 12/06/15 0816 12/07/15 0416  WBC 9.7  --  9.9  NEUTROABS 7.7  --   --   HGB 12.6 13.9 12.1  HCT 38.4 41.0 36.7  MCV 99.5  --  98.7  PLT 259  --  026   Basic Metabolic Panel:  Recent Labs Lab 12/06/15 0752 12/06/15 0816 12/07/15 0416  NA 135 134* 138  K 3.9 3.8 3.7  CL 94* 95* 97*  CO2 26  --  27  GLUCOSE 308* 303* 150*  BUN 16 22* 27*  CREATININE 3.92* 3.90* 5.51*  CALCIUM 9.2  --  9.8   GFR: CrCl cannot be calculated (Unknown ideal weight.). Liver Function Tests:  Recent Labs Lab 12/06/15 0752 12/07/15 0416  AST 40 32  ALT 12* 10*  ALKPHOS 86 70  BILITOT 0.8 0.9  PROT 7.3 6.8  ALBUMIN 3.4* 2.9*    Recent Labs Lab 12/06/15 0752  LIPASE 23    Recent Labs Lab  12/06/15 1605  AMMONIA 70*   Coagulation Profile:  Recent Labs Lab 12/06/15 0752 12/06/15 1532 12/07/15 0416  INR 1.39 1.56 2.01   Cardiac Enzymes:  Recent Labs Lab 12/06/15 1948 12/06/15 2230 12/07/15 0416  TROPONINI 0.24* 1.01* 2.17*   BNP (last 3 results) No results for input(s): PROBNP in the last 8760 hours. HbA1C: No results for input(s): HGBA1C in the last 72 hours. CBG:  Recent Labs Lab 12/07/15 0718  GLUCAP 143*   Lipid Profile: No results for input(s): CHOL, HDL, LDLCALC, TRIG, CHOLHDL, LDLDIRECT in the last 72 hours. Thyroid Function Tests: No results for input(s): TSH, T4TOTAL, FREET4, T3FREE, THYROIDAB in the last 72 hours. Anemia Panel: No results for input(s): VITAMINB12, FOLATE, FERRITIN, TIBC, IRON, RETICCTPCT in the last 72 hours. Urine analysis: No results found for: COLORURINE, APPEARANCEUR, LABSPEC, PHURINE, GLUCOSEU, HGBUR, BILIRUBINUR, KETONESUR, PROTEINUR, UROBILINOGEN, NITRITE, LEUKOCYTESUR Sepsis Labs: @LABRCNTIP (procalcitonin:4,lacticidven:4)   )No results found for this or  any previous visit (from the past 240 hour(s)).    Radiology Studies: Ct Head Wo Contrast Result Date: 12/07/2015 Acute LEFT PCA territory infarct involving the occipital lobe and thalamus. Acute RIGHT superior cerebellar artery territory infarct. Evolving bilateral temporal occipital lobe intraparenchymal hematomas. Severe atherosclerosis. Dense basilar artery and to lesser extent additional intracranial vessels most compatible with hemoconcentration though, basilar artery thrombosis is a concern given acute posterior circulation infarcts. These results will be called to the ordering clinician or representative by the Radiologist Assistant, and communication documented in the PACS or zVision Dashboard. Electronically Signed   By: Elon Alas M.D.   On: 12/07/2015 06:49   Ct Head Wo Contrast Result Date: 12/06/2015 Changes consistent with parenchymal  hemorrhage in the temporo-occipital regions bilaterally. There is some suggestion of subarachnoid extension along the inferior aspect in the right occipital lobe. Critical Value/emergent results were discussed by telephone at the time of interpretation on 12/06/2015 at 4:45 pm with the admitting physician, who verbally acknowledged these results. Electronically Signed   By: Inez Catalina M.D.   On: 12/06/2015 16:46   Ct Abdomen Pelvis W Contrast Result Date: 12/06/2015 Nodular hepatic margins are noted concerning for hepatic cirrhosis. Large amount of ascites is noted. Irregular peripheral low densities are noted most consistent with old infarctions. Severe bilateral renal atrophy is noted consistent with history of end-stage renal disease. 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 Aortic atherosclerosis. Probable large fluid-filled left femoral hernia is noted. Electronically Signed   By: Marijo Conception, M.D.   On: 12/06/2015 10:50   Dg Chest Port 1 View Result Date: 12/06/2015 Stable cardiomegaly. Hazy and linear diffuse parahilar opacities in both lungs appear stable and could represent mild pulmonary edema or infection. Electronically Signed   By: Ilona Sorrel M.D.   On: 12/06/2015 15:38   Dg Abd Acute W/chest Result Date: 12/06/2015 1. Mild enlargement of the cardiac silhouette. Mild pulmonary edema. 2. Mild scarring versus atelectasis of both lung bases. 3. Nonobstructive bowel gas pattern. 4. Aortic atherosclerosis. Electronically Signed   By: Ilona Sorrel M.D.   On: 12/06/2015 09:08      Scheduled Meds: . sodium chloride   Intravenous STAT  . cinacalcet  60 mg Oral Q breakfast  . insulin aspart  0-9 Units Subcutaneous TID WC  . labetalol  200 mg Oral BID  . piperacillin-tazobactam (ZOSYN)  IV  3.375 g Intravenous Q12H  . sodium chloride flush  3 mL Intravenous Q12H  . vancomycin  500 mg Intravenous Q T,Th,Sa-HD   Continuous Infusions:     LOS: 1 day    Time spent: 25 minutes  Greater than 50% of the time spent on counseling and coordinating the care.   Leisa Lenz, MD [facility] Pager [phone]  If 7PM-7AM, please contact night-coverage [email or web address] Password [email or web address] 12/07/2015, 11:09 AM

## 2015-12-07 NOTE — Progress Notes (Signed)
Lactulose retention enema completed.  Patient remains unresponsive, family at bedside, poor results.

## 2015-12-07 NOTE — Progress Notes (Signed)
Pt back to room from procedure; pt remains non-verbal, unresponsive only has her eyes open slightly; adhesive bandages on abd remains clean, dry and intact with not active bleeding or drainage noted. VSS; telemetry verified; remains on 2L oxygen; will continue to closely monitor pt. Call light within reach and family at bedside. PDarden Palmer Anapaula Severt Rn

## 2015-12-07 NOTE — Procedures (Signed)
Successful US guided paracentesis from right lateral abdomen.  Yielded 5.45 liters of hazy yellow fluid.  No immediate complications.  Pt tolerated well.   Specimen was sent for labs.  [name] S Evelin Cake PA-C 12/07/2015 1:09 PM

## 2015-12-07 NOTE — Progress Notes (Signed)
Pt had a small mucous bloody stool; per previous RN MD is aware. Will continue to closely monitor pt. Delia Heady RN

## 2015-12-07 NOTE — Consult Note (Signed)
[name] is an 75 y.o. female referred by Dr Leisa Lenz   Chief Complaint: ESRD, Sec HPTH, Anemia HPI: 75yo [place] female recently Dc from hospital for ischemic stroke is now readmitted for acute intracerebral bleed and abd pain.  She is normally on HD at [facility] and was just seen tues in her usual state of health.  In ER developed fever and today paracentesis done which shows 5800 wbc's.  She is now on empiric AB.  Seen by neurosurgery and no need for for surgical intervention.  Pt now being seen by palliative care.  History reviewed. No pertinent past medical history.  History reviewed. No pertinent surgical history.  History reviewed. No pertinent family history. Social History:  reports that she has never smoked. She has never used smokeless tobacco. Her alcohol and drug histories are not on file.  Allergies: Not on File  Medications Prior to Admission  Medication Sig Dispense Refill  . aspirin 325 MG tablet Take 325 mg by mouth every 6 (six) hours as needed for mild pain.    . cinacalcet (SENSIPAR) 60 MG tablet Take 60 mg by mouth daily.    Marland Kitchen glipiZIDE (GLUCOTROL) 10 MG tablet Take 10 mg by mouth daily before breakfast.    . labetalol (NORMODYNE) 200 MG tablet Take 200 mg by mouth 2 (two) times daily.    Marland Kitchen omeprazole (PRILOSEC) 20 MG capsule Take 20 mg by mouth daily.    . sorbitol 70 % solution Take 15 mLs by mouth daily as needed.       Lab Results: UA: ND  Recent Labs  12/06/15 0752 12/06/15 0816 12/07/15 0416  WBC 9.7  --  9.9  HGB 12.6 13.9 12.1  HCT 38.4 41.0 36.7  PLT 259  --  215   BMET  Recent Labs  12/06/15 0752 12/06/15 0816 12/07/15 0416  NA 135 134* 138  K 3.9 3.8 3.7  CL 94* 95* 97*  CO2 26  --  27  GLUCOSE 308* 303* 150*  BUN 16 22* 27*  CREATININE 3.92* 3.90* 5.51*  CALCIUM 9.2  --  9.8   LFT  Recent Labs  12/07/15 0416  PROT 6.8  ALBUMIN 2.9*  AST 32  ALT 10*  ALKPHOS 70  BILITOT 0.9   Ct Head Wo Contrast  Result  Date: 12/07/2015 CLINICAL DATA:  Intracranial hemorrhage, multi organ failure. EXAM: CT HEAD WITHOUT CONTRAST TECHNIQUE: Contiguous axial images were obtained from the base of the skull through the vertex without intravenous contrast. COMPARISON:  CT HEAD December 06, 2014 FINDINGS: BRAIN: Moderate ventriculomegaly on the basis of global parenchymal brain volume loss. Similar LEFT temporal occipital lobe 5.9 x 2.4 cm intraparenchymal hematoma with surrounding low-density vasogenic edema. New blurring of the LEFT medial occipital lobe and LEFT thalamus gray-white matter junction, contiguous with the posterior aspect of the hematoma. Wedge-like hypodensity RIGHT superior cerebellum. Evolving RIGHT temporal occipital lobe intraparenchymal hematoma. Local sulcal effacement without midline shift. Gyral density RIGHT and appear temporal occipital lobe. Patchy to confluent white matter hypodensities exclusive of the aforementioned abnormality compatible with moderate chronic small vessel ischemic disease. Old LEFT basal ganglia lacunar infarcts. Scattered sub cm parenchymal calcifications can be seen with prior infection such as neurocysticercosis. No abnormal extra-axial fluid collections. Basal cisterns are patent. VASCULAR: Severe calcific atherosclerosis of the carotid siphons and included vertebral arteries. Scalp vascular calcifications. Dense basilar artery and to lesser extent remaining intracranial vessels. SKULL: No skull fracture. No significant scalp soft tissue  swelling. SINUSES/ORBITS: The mastoid air-cells and included paranasal sinuses are well-aerated.Status post RIGHT ocular lens implant. The included ocular globes and orbital contents are non-suspicious. OTHER: Patient is edentulous. 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 Acute RIGHT superior cerebellar artery territory infarct. Evolving bilateral temporal occipital lobe intraparenchymal hematomas. Severe  atherosclerosis. Dense basilar artery and to lesser extent additional intracranial vessels most compatible with hemoconcentration though, basilar artery thrombosis is a concern given acute posterior circulation infarcts. These results will be called to the ordering clinician or representative by the Radiologist Assistant, and communication documented in the PACS or zVision Dashboard. Electronically Signed   By: Elon Alas M.D.   On: 12/07/2015 06:49   Ct Head Wo Contrast  Result Date: 12/06/2015 CLINICAL DATA:  Increasing confusion and history of recent CVA EXAM: CT HEAD WITHOUT CONTRAST TECHNIQUE: Contiguous axial images were obtained from the base of the skull through the vertex without intravenous contrast. COMPARISON:  None. FINDINGS: Brain: There are areas of parenchymal hemorrhage identified in the right temporo-occipital region as well as in the left temporo-occipital area inferomedially along the tentorium cerebelli. The right-sided area measures approximately 6.3 cm in greatest dimension. The area on the left measures approximately 5.5 cm greatest dimension. Some superior extension towards the left lateral ventricle is noted with some buckling of the lateral ventricular wall inferiorly although no definitive intraventricular component of hemorrhage is seen. Some linear areas are noted along the inferior aspect of the hemorrhage on the right suggesting the possibility of such as subarachnoid component. Some mild white matter edema is noted in the left occipital lobe related to the hemorrhage. Mild atrophic changes are seen. Vascular: No hyperdense vessel or unexpected calcification. Skull: Normal. Negative for fracture or focal lesion. Sinuses/Orbits: No acute finding. Other: None. IMPRESSION: Changes consistent with parenchymal hemorrhage in the temporo-occipital regions bilaterally. There is some suggestion of subarachnoid extension along the inferior aspect in the right occipital lobe.  Critical Value/emergent results were discussed by telephone at the time of interpretation on 12/06/2015 at 4:45 pm with the admitting physician, who verbally acknowledged these results. Electronically Signed   By: Inez Catalina M.D.   On: 12/06/2015 16:46   Ct Abdomen Pelvis W Contrast  Result Date: 12/06/2015 CLINICAL DATA:  Generalized abdominal pain and distention. EXAM: CT ABDOMEN AND PELVIS WITH CONTRAST TECHNIQUE: Multidetector CT imaging of the abdomen and pelvis was performed using the standard protocol following bolus administration of intravenous contrast. CONTRAST:  151mL ISOVUE-300 IOPAMIDOL (ISOVUE-300) INJECTION 61% COMPARISON:  None. FINDINGS: Lower chest: Visualized lung bases are unremarkable. Hepatobiliary: No gallstones are noted. Nodular hepatic margins are noted concerning for hepatic cirrhosis. No focal abnormality is noted within the liver. Pancreas: Normal. Spleen: Irregular low densities are noted within the spleen which are peripheral and may represent old infarctions. Adrenals/Urinary Tract: Adrenal glands appear normal. Severe bilateral renal atrophy is noted consistent with history of end-stage renal disease. Stomach/Bowel: There is no evidence of bowel obstruction. Vascular/Lymphatic: Atherosclerosis of abdominal aorta is noted without aneurysm formation. No significant adenopathy is noted. Reproductive: Uterus and ovaries are unremarkable. Other: Large amount of ascites is noted. Probable large fluid-filled left femoral hernia is noted. 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 IMPRESSION: Nodular hepatic margins are noted concerning for hepatic cirrhosis. Large amount of ascites is noted. Irregular peripheral low densities are noted most consistent with old infarctions. Severe bilateral renal atrophy is noted consistent with  history of end-stage renal disease. Sclerotic densities are noted  in the lumbar spine consistent with renal osteodystrophy. Aortic atherosclerosis. Probable large fluid-filled left femoral hernia is noted. Electronically Signed   By: Marijo Conception, M.D.   On: 12/06/2015 10:50   US Paracentesis  Result Date: 12/07/2015 INDICATION: End stage renal disease. Cirrhosis. Ascites. Request for diagnostic and therapeutic paracentesis. EXAM: ULTRASOUND GUIDED RIGHT LATERAL ABDOMEN PARACENTESIS MEDICATIONS: 1% Lidocaine. COMPLICATIONS: None immediate. PROCEDURE: Informed written consent was obtained from the patient after a discussion of the risks, benefits and alternatives to treatment. A timeout was performed prior to the initiation of the procedure. Initial ultrasound scanning demonstrates a large amount of ascites within the right lateral abdomen. The right lateral abdomen was prepped and draped in the usual sterile fashion. 1% lidocaine with epinephrine was used for local anesthesia. Following this, a 19 gauge, 7-cm, Yueh catheter was introduced. An ultrasound image was saved for documentation purposes. The paracentesis was performed. The catheter was removed and a dressing was applied. The patient tolerated the procedure well without immediate post procedural complication. FINDINGS: A total of approximately 5.45 liters of hazy yellow fluid was removed. Samples were sent to the laboratory as requested by the clinical team. IMPRESSION: Successful ultrasound-guided paracentesis yielding 5.45 liters of peritoneal fluid. Read by:  Gareth Eagle, PA-C Electronically Signed   By: Markus Daft M.D.   On: 12/07/2015 13:08   Dg Chest Port 1 View  Result Date: 12/06/2015 CLINICAL DATA:  Sepsis EXAM: PORTABLE CHEST 1 VIEW COMPARISON:  Chest radiograph from earlier today. FINDINGS: Right rotated chest radiograph. Stable cardiomediastinal silhouette with cardiomegaly and aortic atherosclerosis. No pneumothorax. No pleural effusion. Hazy and linear diffuse parahilar opacities in both lungs  appear stable. IMPRESSION: Stable cardiomegaly. Hazy and linear diffuse parahilar opacities in both lungs appear stable and could represent mild pulmonary edema or infection. Electronically Signed   By: Ilona Sorrel M.D.   On: 12/06/2015 15:38   Dg Abd Acute W/chest  Result Date: 12/06/2015 CLINICAL DATA:  Abdominal pain.  Dialysis patient. EXAM: DG ABDOMEN ACUTE W/ 1V CHEST COMPARISON:  None. FINDINGS: Right rotated chest radiograph. Mild enlargement of the cardiac silhouette. Mildly tortuous atherosclerotic thoracic aorta. Otherwise normal mediastinal contour. No pneumothorax. No pleural effusion. Mild pulmonary edema. Mild bibasilar scarring versus atelectasis. No disproportionately dilated small bowel loops. Mild-to-moderate stool and gas throughout the colon. No evidence of pneumatosis or pneumoperitoneum. Extensive atherosclerotic calcifications are noted throughout the abdominopelvic vessels. IMPRESSION: 1. Mild enlargement of the cardiac silhouette. Mild pulmonary edema. 2. Mild scarring versus atelectasis of both lung bases. 3. Nonobstructive bowel gas pattern. 4. Aortic atherosclerosis. Electronically Signed   By: Ilona Sorrel M.D.   On: 12/06/2015 09:08    ROS: Pt unable to communicate  PHYSICAL EXAM: Blood pressure (!) 111/48, pulse 80, temperature (!) 100.4 F (38 C), temperature source Axillary, resp. rate 16, weight 53 kg (116 lb 13.5 oz), SpO2 100 %. HEENT: PERRL NECK:No JVD LUNGS:Clear CARDIAC:irreg, irreg ABD:+ BS, mild distention, minimal tenderness, no rebound EXT:No CCE  LUA AVF + bruit NEURO:opens eyes but not conversant   [place]: TTS, 3hr72min, BFR 400, DW 52.5kg, 2K2Ca, profile 4  Assessment: 1. New intracerebral bleed 2. ? SBE 3. ESRD 4. Sec HPTH 5. HTN PLAN: 1. Will plan HD tomorrow.  I was in room with palliative care and family from [place] will be trying to come in.  Her grandson and son are the main decision makers and plan is to continue HD  for now with  plans to stop if condition does not improve or deteriorates.  Palliative care will be meeting with grandson and son again in AM 2. Await peritoneal cultures and cont empiric AB 3. Prognosis is not good 4. I would not give her meds until mental status improves   Adisynn Suleiman T 12/07/2015, 3:53 PM

## 2015-12-07 NOTE — Progress Notes (Signed)
I met briefly with patient and family this afternoon.  Her grandson is present and reports that his father (her son) and he make decisions on her behalf.  Her son is not present at this time.  We have arranged for a family meeting tomorrow morning at 0700.  Family is trying to arrange for additional family from [place] to visit.  Spoke with CSW and will assist as we are able.  Micheline Rough, MD [facility] Team [phone]

## 2015-12-07 NOTE — Evaluation (Signed)
Physical Therapy Evaluation Patient Details Name: [name] MRN: [id number] DOB: 04-06-1940 Today's Date: 12/07/2015   History of Present Illness  pt is a 75 y/o female with [place] of ESRD on HD, recent CVA, HTN, was at home after recent d/c from hospital apparently walking/talking when mentation became more altered/confused for which she was brought in.  CT showed parenchymal hemorrhage bil temporoccipital regions, acute L PCA territory infartct at occipital lobe and thalamus and acute Right superior cerebellar artery territory infarct.  10/26-- pt s/p paracentesis.  Clinical Impression  Pt admitted with/for AMS and became less responsive with bil infarcts/hemorrhages per CT.  Pt currently limited functionally due to the problems listed below.  (see problems list.)  PT will remain in for now to see if pt has any positive changes as she is not able to participate at this time.    Follow Up Recommendations Other (comment) (likely palliative will take over vs SNF)    Equipment Recommendations  Other (comment) (TBD)    Recommendations for Other Services       Precautions / Restrictions Precautions Precautions: Fall      Mobility  Bed Mobility Overal bed mobility: Needs Assistance Bed Mobility: Supine to Sit;Sit to Supine     Supine to sit: Total assist;+2 for safety/equipment Sit to supine: Total assist;+2 for physical assistance   General bed mobility comments: pt unresponsive, no voluntary movement  Transfers Overall transfer level: Needs assistance   Transfers: Sit to/from Stand Sit to Stand: Total assist         General transfer comment: stand without pt assist.  pt's eyes finally opened with stand-  family stated she was likely mad.  Ambulation/Gait                Stairs            Wheelchair Mobility    Modified Rankin (Stroke Patients Only) Modified Rankin (Stroke Patients Only) Modified Rankin: Severe disability     Balance Overall  balance assessment: Needs assistance   Sitting balance-Leahy Scale: Zero       Standing balance-Leahy Scale: Zero                               Pertinent Vitals/Pain Pain Assessment: Faces Pain Score: 0-No pain Faces Pain Scale: No hurt    Home Living Family/patient expects to be discharged to:: Other (Comment) (likely palliative/hospice vs SNF)                      Prior Function           Comments: per family was fine, walking/ talking, not at all like she is presently     Hand Dominance        Extremity/Trunk Assessment   Upper Extremity Assessment:  (no spontaneous movement except weak w/drawal from nox. stimi)           Lower Extremity Assessment:  (no spontaneous mvmt except from nox. stim.)         Communication      Cognition Arousal/Alertness:  (unresponsive except to pain and noxious stim-- in sitting)   Overall Cognitive Status: Difficult to assess                      General Comments General comments (skin integrity, edema, etc.): pt was not responsive for 99% of time at EOB with quick wash up and  noxious stimuli, including nail bed pressure which she withdrew from weakly    Exercises     Assessment/Plan    PT Assessment Patient needs continued PT services (will stay on case to see if change b/w now and next week)  PT Problem List Decreased strength;Decreased mobility;Other (comment) (arousal)          PT Treatment Interventions Functional mobility training;Therapeutic activities    PT Goals (Current goals can be found in the Care Plan section)  Acute Rehab PT Goals Time For Goal Achievement: 12/14/15 Potential to Achieve Goals: Poor    Frequency Min 1X/week   Barriers to discharge        Co-evaluation               End of Session   Activity Tolerance: Other (comment) (no true responses to treatment) Patient left: in bed;with call bell/phone within reach;with family/visitor  present Nurse Communication: Mobility status         Time: [id number] PT Time Calculation (min) (ACUTE ONLY): 32 min   Charges:   PT Evaluation $PT Eval Moderate Complexity: 1 Procedure PT Treatments $Therapeutic Activity: 8-22 mins   PT G Codes:        Shawon Denzer, Tessie Fass 12/07/2015, 5:10 PM 12/07/2015  Donnella Sham, PT [phone]  (pager)

## 2015-12-08 DIAGNOSIS — K7460 Unspecified cirrhosis of liver: Secondary | ICD-10-CM

## 2015-12-08 DIAGNOSIS — Z7189 Other specified counseling: Secondary | ICD-10-CM

## 2015-12-08 DIAGNOSIS — Z515 Encounter for palliative care: Secondary | ICD-10-CM

## 2015-12-08 DIAGNOSIS — A419 Sepsis, unspecified organism: Principal | ICD-10-CM

## 2015-12-08 DIAGNOSIS — G9340 Encephalopathy, unspecified: Secondary | ICD-10-CM

## 2015-12-08 DIAGNOSIS — I629 Nontraumatic intracranial hemorrhage, unspecified: Secondary | ICD-10-CM

## 2015-12-08 LAB — MISC LABCORP TEST (SEND OUT): Labcorp test code: 88062

## 2015-12-08 LAB — GLUCOSE, CAPILLARY
GLUCOSE-CAPILLARY: 140 mg/dL — AB (ref 65–99)
GLUCOSE-CAPILLARY: 151 mg/dL — AB (ref 65–99)
GLUCOSE-CAPILLARY: 114 mg/dL — AB (ref 65–99)
Glucose-Capillary: 108 mg/dL — ABNORMAL HIGH (ref 65–99)

## 2015-12-08 LAB — RENAL FUNCTION PANEL
ALBUMIN: 2.3 g/dL — AB (ref 3.5–5.0)
Anion gap: 18 — ABNORMAL HIGH (ref 5–15)
BUN: 55 mg/dL — AB (ref 6–20)
CALCIUM: 10.3 mg/dL (ref 8.9–10.3)
CO2: 25 mmol/L (ref 22–32)
CREATININE: 7.73 mg/dL — AB (ref 0.44–1.00)
Chloride: 99 mmol/L — ABNORMAL LOW (ref 101–111)
GFR, EST AFRICAN AMERICAN: 5 mL/min — AB (ref >60)
GFR, EST NON AFRICAN AMERICAN: 5 mL/min — AB (ref >60)
Glucose, Bld: 139 mg/dL — ABNORMAL HIGH (ref 65–99)
PHOSPHORUS: 6.8 mg/dL — AB (ref 2.5–4.6)
Potassium: 4.7 mmol/L (ref 3.5–5.1)
Sodium: 142 mmol/L (ref 135–145)

## 2015-12-08 LAB — PH, BODY FLUID: pH, Body Fluid: 7.5

## 2015-12-08 LAB — CBC
HCT: 34.2 % — ABNORMAL LOW (ref 36.0–46.0)
Hemoglobin: 11.3 g/dL — ABNORMAL LOW (ref 12.0–15.0)
MCH: 32.8 pg (ref 26.0–34.0)
MCHC: 33 g/dL (ref 30.0–36.0)
MCV: 99.1 fL (ref 78.0–100.0)
PLATELETS: 246 10*3/uL (ref 150–400)
RBC: 3.45 MIL/uL — AB (ref 3.87–5.11)
RDW: 14.8 % (ref 11.5–15.5)
WBC: 8.5 10*3/uL (ref 4.0–10.5)

## 2015-12-08 LAB — TRIGLYCERIDES, BODY FLUIDS: TRIGLYCERIDES FL: 51 mg/dL

## 2015-12-08 LAB — PROCALCITONIN: Procalcitonin: 55.25 ng/mL

## 2015-12-08 LAB — HEMOGLOBIN A1C
HEMOGLOBIN A1C: 6.7 % — AB (ref 4.8–5.6)
MEAN PLASMA GLUCOSE: 146 mg/dL

## 2015-12-08 MED ORDER — VANCOMYCIN HCL IN DEXTROSE 500-5 MG/100ML-% IV SOLN
INTRAVENOUS | Status: AC
  Administered 2015-12-08: 500 mg
  Filled 2015-12-08: qty 100

## 2015-12-08 MED ORDER — ACETAMINOPHEN 650 MG RE SUPP
650.0000 mg | RECTAL | Status: DC | PRN
  Administered 2015-12-09 – 2015-12-12 (×8): 650 mg via RECTAL
  Filled 2015-12-08 (×8): qty 1

## 2015-12-08 MED ORDER — RENA-VITE PO TABS: 1.0000 | ORAL_TABLET | Freq: Every day | ORAL | Status: DC

## 2015-12-08 NOTE — Progress Notes (Addendum)
PROGRESS NOTE                                                                                                                                                                                                             Patient Demographics:    [name], is a 75 y.o. female, DOB - 01-16-1941, [id number]  Admit date - 12/06/2015   Admitting Physician Waldemar Dickens, MD  Outpatient Primary MD for the patient is No primary care provider on file.  LOS - 2  Outpatient Specialists:Renal  Chief Complaint  Patient presents with  . Altered Mental Status       Brief Narrative   75 year old Hispanic female with end-stage renal disease on dialysis,( Tu,Th,Sat), recent hospitalization for CVA (discharged on 11/23/2015 on aspirin), diabetes mellitus, A. fib, hypertension brought to the ED with acute encephalopathy. On presentation she was septic with fever of 102.5 daily Fahrenheit and tachypnea. Blood work showed creatinine of 2.9, glucose of 308 and lactic acid of 3. Abdominal x-ray showed nonobstructive bowel gas pattern, chest x-ray negative for infiltrate. CT abdomen showed hepatic cirrhosis with large amount of ascites. CT head showed parenchymal hemorrhage in the temporalis occipital region bilaterally with possible subarachnoid extension along the inferior accident of the right occipital lobe. Neurosurgeon consulted who recommended she was not a good surgical candidate given her severe comorbidities. Palliative care consulted for goals of care discussion given her overall poor prognosis.   Subjective:   Reportedly more awake today but on my exam she is difficult to arouse. Still having fever.   Assessment  & Plan :    Principal Problem:   Sepsis ([place]) No clear source. Suspect SBP. Elevated lactic acid and for calcitonin on admission. On empiric vancomycin and Zosyn. Still febrile. Blood cultures negative. Ascites  fluid culture pending.  Active Problems: Acute hepatic encephalopathy/bilateral intracerebral hemorrhage with hemorrhagic infarctions. Patient had ascites with elevated ammonia level (70) on admission. Per neurosurgery, Not a candidate for surgery. She remains encephalopathic. Continue neuro checks. Patient not interested to participate with PT.  Liver cirrhosis As noted on CT abdomen. No prior history. Treating for SBP.  Essential hypertension  continued labetalol.  Elevated troponin   EKG unremarkable. Peaked at 2.17. Patient unable to communicate. Will  hold off on cardiac workup at present.  End-stage renal disease on dialysis Renal  following. Family inclined on continuing dialysis.  Diabetes mellitus,with neuropathy Maintaining on sliding insulin coverage.   Goals of care Oral prognosis is extremely guarded. Palliative care met with family this morning and they would like to continue with current therapy including dialysis for a limited time to see for any improvement. Informed about her poor prognosis. I will reinforce again. She remains to be DO NOT RESUSCITATE.    Code Status : DO NOT RESUSCITATE  Family Communication  : Grandson at bedside  Disposition Plan  : Pending hospital course  Barriers For Discharge : Active symptoms  Consults  :  Renal PC CM Neurosurgery (Dr. Arnoldo Morale)  Procedures  :  Head CT Paracentesis  DVT Prophylaxis  :  SCDs  Lab Results  Component Value Date   PLT 215 12/07/2015    Antibiotics  :   Anti-infectives    Start     Dose/Rate Route Frequency Ordered Stop   12/08/15 1400  vancomycin (VANCOCIN) IVPB 750 mg/150 ml premix  Status:  Discontinued     750 mg 150 mL/hr over 60 Minutes Intravenous Every 48 hours 12/06/15 1409 12/06/15 1411   12/08/15 1200  vancomycin (VANCOCIN) 500 mg in sodium chloride 0.9 % 100 mL IVPB     500 mg 100 mL/hr over 60 Minutes Intravenous Every M-W-F (Hemodialysis) 12/07/15 1626     12/07/15 1200   vancomycin (VANCOCIN) 500 mg in sodium chloride 0.9 % 100 mL IVPB  Status:  Discontinued     500 mg 100 mL/hr over 60 Minutes Intravenous Every T-Th-Sa (Hemodialysis) 12/06/15 1428 12/07/15 1626   12/07/15 0200  piperacillin-tazobactam (ZOSYN) IVPB 3.375 g     3.375 g 12.5 mL/hr over 240 Minutes Intravenous Every 12 hours 12/06/15 1428     12/06/15 2200  piperacillin-tazobactam (ZOSYN) IVPB 2.25 g  Status:  Discontinued     2.25 g 100 mL/hr over 30 Minutes Intravenous Every 8 hours 12/06/15 1409 12/06/15 1411   12/06/15 1400  piperacillin-tazobactam (ZOSYN) IVPB 3.375 g     3.375 g 100 mL/hr over 30 Minutes Intravenous  Once 12/06/15 1358 12/06/15 1509   12/06/15 1400  vancomycin (VANCOCIN) IVPB 1000 mg/200 mL premix     1,000 mg 200 mL/hr over 60 Minutes Intravenous  Once 12/06/15 1358 12/06/15 1614        Objective:   Vitals:   12/07/15 2100 12/08/15 0120 12/08/15 0510 12/08/15 0808  BP: (!) 118/27 (!) 112/35 (!) 116/36 (!) 113/42  Pulse: 87 64 83 78  Resp:  _0 Temp: 98.9 F (37.2 C) (!) 101.9 F (38.8 C) (!) 100.8 F (38.2 C) (!) 100.6 F (38.1 C)  TempSrc: Oral Oral Oral Axillary  SpO2: 100% 100% 100% 100%  Weight:        Wt Readings from Last 3 Encounters:  12/06/15 53 kg (116 lb 13.5 oz)     Intake/Output Summary (Last 24 hours) at 12/08/15 1205 Last data filed at 12/07/15 1312  Gross per 24 hour  Intake              100 ml  Output                0 ml  Net              100 ml     Physical Exam  Gen: Elderly thin built female, poorly arousable nonverbal HEENT:, moist mucosa, supple neck Chest: clear b/l, no added sounds CVS: N S1&S2, no murmurs, rubs or  gallop GI: soft, NT, ND, BS+ Musculoskeletal: warm, no edema, left upper extremity fistula CNS: Poorly arousable, nonverbal    Data Review:    CBC  Recent Labs Lab 12/06/15 0752 12/06/15 0816 12/07/15 0416  WBC 9.7  --  9.9  HGB 12.6 13.9 12.1  HCT 38.4 41.0 36.7  PLT 259  --  215    MCV 99.5  --  98.7  MCH 32.6  --  32.5  MCHC 32.8  --  33.0  RDW 14.5  --  14.5  LYMPHSABS 0.9  --   --   MONOABS 1.0  --   --   EOSABS 0.0  --   --   BASOSABS 0.1  --   --     Chemistries   Recent Labs Lab 12/06/15 0752 12/06/15 0816 12/07/15 0416  NA 135 134* 138  K 3.9 3.8 3.7  CL 94* 95* 97*  CO2 26  --  27  GLUCOSE 308* 303* 150*  BUN 16 22* 27*  CREATININE 3.92* 3.90* 5.51*  CALCIUM 9.2  --  9.8  AST 40  --  32  ALT 12*  --  10*  ALKPHOS 86  --  70  BILITOT 0.8  --  0.9   ------------------------------------------------------------------------------------------------------------------ No results for input(s): CHOL, HDL, LDLCALC, TRIG, CHOLHDL, LDLDIRECT in the last 72 hours.  Lab Results  Component Value Date   HGBA1C 6.7 (H) 12/07/2015   ------------------------------------------------------------------------------------------------------------------ No results for input(s): TSH, T4TOTAL, T3FREE, THYROIDAB in the last 72 hours.  Invalid input(s): FREET3 ------------------------------------------------------------------------------------------------------------------ No results for input(s): VITAMINB12, FOLATE, FERRITIN, TIBC, IRON, RETICCTPCT in the last 72 hours.  Coagulation profile  Recent Labs Lab 12/06/15 0752 12/06/15 1532 12/07/15 0416  INR 1.39 1.56 2.01    No results for input(s): DDIMER in the last 72 hours.  Cardiac Enzymes  Recent Labs Lab 12/06/15 1948 12/06/15 2230 12/07/15 0416  TROPONINI 0.24* 1.01* 2.17*   ------------------------------------------------------------------------------------------------------------------    Component Value Date/Time   BNP 4,306.8 (H) 12/06/2015 1948    Inpatient Medications  Scheduled Meds: . cinacalcet  60 mg Oral Q breakfast  . insulin aspart  0-9 Units Subcutaneous TID WC  . multivitamin  1 tablet Oral QHS  . piperacillin-tazobactam (ZOSYN)  IV  3.375 g Intravenous Q12H  . sodium  chloride flush  3 mL Intravenous Q12H  . vancomycin  500 mg Intravenous Q M,W,F-HD   Continuous Infusions:  PRN Meds:.acetaminophen, bisacodyl, HYDROcodone-acetaminophen, ondansetron **OR** ondansetron (ZOFRAN) IV, senna-docusate, traZODone  Micro Results Recent Results (from the past 240 hour(s))  Culture, blood (routine x 2)     Status: None (Preliminary result)   Collection Time: 12/06/15 12:58 PM  Result Value Ref Range Status   Specimen Description BLOOD RIGHT WRIST  Final   Special Requests IN PEDIATRIC BOTTLE  2CC  Final   Culture NO GROWTH 1 DAY  Final   Report Status PENDING  Incomplete  Culture, blood (routine x 2)     Status: None (Preliminary result)   Collection Time: 12/06/15  1:05 PM  Result Value Ref Range Status   Specimen Description BLOOD RIGHT HAND  Final   Special Requests IN PEDIATRIC BOTTLE 2CC  Final   Culture NO GROWTH 1 DAY  Final   Report Status PENDING  Incomplete  Gram stain     Status: None   Collection Time: 12/07/15  2:02 PM  Result Value Ref Range Status   Specimen Description FLUID PERITONEAL  Final   Special Requests NONE  Final   Gram Stain   Final    MODERATE WBC PRESENT, PREDOMINANTLY PMN NO ORGANISMS SEEN    Report Status 12/07/2015 FINAL  Final    Radiology Reports X-ray Chest Pa And Lateral  Result Date: 12/07/2015 CLINICAL DATA:  Sepsis EXAM: CHEST  2 VIEW COMPARISON:  December 06, 2015 FINDINGS: The heart size and mediastinal contours are stable. The heart size is enlarged. There is mild increased pulmonary interstitium bilaterally. There is no focal pneumonia or pleural effusion. There is minimal atelectasis of the lateral left lung base. The visualized skeletal structures are stable. IMPRESSION: Mild interstitial edema.  No focal pneumonia. Electronically Signed   By: Abelardo Diesel M.D.   On: 12/07/2015 18:04   Ct Head Wo Contrast  Result Date: 12/07/2015 CLINICAL DATA:  Intracranial hemorrhage, multi organ failure. EXAM: CT HEAD  WITHOUT CONTRAST TECHNIQUE: Contiguous axial images were obtained from the base of the skull through the vertex without intravenous contrast. COMPARISON:  CT HEAD December 06, 2014 FINDINGS: BRAIN: Moderate ventriculomegaly on the basis of global parenchymal brain volume loss. Similar LEFT temporal occipital lobe 5.9 x 2.4 cm intraparenchymal hematoma with surrounding low-density vasogenic edema. New blurring of the LEFT medial occipital lobe and LEFT thalamus gray-white matter junction, contiguous with the posterior aspect of the hematoma. Wedge-like hypodensity RIGHT superior cerebellum. Evolving RIGHT temporal occipital lobe intraparenchymal hematoma. Local sulcal effacement without midline shift. Gyral density RIGHT and appear temporal occipital lobe. Patchy to confluent white matter hypodensities exclusive of the aforementioned abnormality compatible with moderate chronic small vessel ischemic disease. Old LEFT basal ganglia lacunar infarcts. Scattered sub cm parenchymal calcifications can be seen with prior infection such as neurocysticercosis. No abnormal extra-axial fluid collections. Basal cisterns are patent. VASCULAR: Severe calcific atherosclerosis of the carotid siphons and included vertebral arteries. Scalp vascular calcifications. Dense basilar artery and to lesser extent remaining intracranial vessels. SKULL: No skull fracture. No significant scalp soft tissue swelling. SINUSES/ORBITS: The mastoid air-cells and included paranasal sinuses are well-aerated.Status post RIGHT ocular lens implant. The included ocular globes and orbital contents are non-suspicious. OTHER: Patient is edentulous. 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 Acute RIGHT superior cerebellar artery territory infarct. Evolving bilateral temporal occipital lobe intraparenchymal hematomas. Severe atherosclerosis. Dense basilar artery and to lesser extent additional intracranial vessels  most compatible with hemoconcentration though, basilar artery thrombosis is a concern given acute posterior circulation infarcts. These results will be called to the ordering clinician or representative by the Radiologist Assistant, and communication documented in the PACS or zVision Dashboard. Electronically Signed   By: Elon Alas M.D.   On: 12/07/2015 06:49   Ct Head Wo Contrast  Result Date: 12/06/2015 CLINICAL DATA:  Increasing confusion and history of recent CVA EXAM: CT HEAD WITHOUT CONTRAST TECHNIQUE: Contiguous axial images were obtained from the base of the skull through the vertex without intravenous contrast. COMPARISON:  None. FINDINGS: Brain: There are areas of parenchymal hemorrhage identified in the right temporo-occipital region as well as in the left temporo-occipital area inferomedially along the tentorium cerebelli. The right-sided area measures approximately 6.3 cm in greatest dimension. The area on the left measures approximately 5.5 cm greatest dimension. Some superior extension towards the left lateral ventricle is noted with some buckling of the lateral ventricular wall inferiorly although no definitive intraventricular component of hemorrhage is seen. Some linear areas are noted along the inferior aspect of the hemorrhage on the right suggesting the possibility of such as subarachnoid component. Some mild  white matter edema is noted in the left occipital lobe related to the hemorrhage. Mild atrophic changes are seen. Vascular: No hyperdense vessel or unexpected calcification. Skull: Normal. Negative for fracture or focal lesion. Sinuses/Orbits: No acute finding. Other: None. IMPRESSION: Changes consistent with parenchymal hemorrhage in the temporo-occipital regions bilaterally. There is some suggestion of subarachnoid extension along the inferior aspect in the right occipital lobe. Critical Value/emergent results were discussed by telephone at the time of interpretation on  12/06/2015 at 4:45 pm with the admitting physician, who verbally acknowledged these results. Electronically Signed   By: Inez Catalina M.D.   On: 12/06/2015 16:46   Ct Abdomen Pelvis W Contrast  Result Date: 12/06/2015 CLINICAL DATA:  Generalized abdominal pain and distention. EXAM: CT ABDOMEN AND PELVIS WITH CONTRAST TECHNIQUE: Multidetector CT imaging of the abdomen and pelvis was performed using the standard protocol following bolus administration of intravenous contrast. CONTRAST:  168m ISOVUE-300 IOPAMIDOL (ISOVUE-300) INJECTION 61% COMPARISON:  None. FINDINGS: Lower chest: Visualized lung bases are unremarkable. Hepatobiliary: No gallstones are noted. Nodular hepatic margins are noted concerning for hepatic cirrhosis. No focal abnormality is noted within the liver. Pancreas: Normal. Spleen: Irregular low densities are noted within the spleen which are peripheral and may represent old infarctions. Adrenals/Urinary Tract: Adrenal glands appear normal. Severe bilateral renal atrophy is noted consistent with history of end-stage renal disease. Stomach/Bowel: There is no evidence of bowel obstruction. Vascular/Lymphatic: Atherosclerosis of abdominal aorta is noted without aneurysm formation. No significant adenopathy is noted. Reproductive: Uterus and ovaries are unremarkable. Other: Large amount of ascites is noted. Probable large fluid-filled left femoral hernia is noted. 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 IMPRESSION: Nodular hepatic margins are noted concerning for hepatic cirrhosis. Large amount of ascites is noted. Irregular peripheral low densities are noted most consistent with old infarctions. Severe bilateral renal atrophy is noted consistent with history of end-stage renal disease. 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 Aortic atherosclerosis. Probable  large fluid-filled left femoral hernia is noted. Electronically Signed   By: JMarijo Conception M.D.   On: 12/06/2015 10:50   UKoreaParacentesis  Result Date: 12/07/2015 INDICATION: End stage renal disease. Cirrhosis. Ascites. Request for diagnostic and therapeutic paracentesis. EXAM: ULTRASOUND GUIDED RIGHT LATERAL ABDOMEN PARACENTESIS MEDICATIONS: 1% Lidocaine. COMPLICATIONS: None immediate. PROCEDURE: Informed written consent was obtained from the patient after a discussion of the risks, benefits and alternatives to treatment. A timeout was performed prior to the initiation of the procedure. Initial ultrasound scanning demonstrates a large amount of ascites within the right lateral abdomen. The right lateral abdomen was prepped and draped in the usual sterile fashion. 1% lidocaine with epinephrine was used for local anesthesia. Following this, a 19 gauge, 7-cm, Yueh catheter was introduced. An ultrasound image was saved for documentation purposes. The paracentesis was performed. The catheter was removed and a dressing was applied. The patient tolerated the procedure well without immediate post procedural complication. FINDINGS: A total of approximately 5.45 liters of hazy yellow fluid was removed. Samples were sent to the laboratory as requested by the clinical team. IMPRESSION: Successful ultrasound-guided paracentesis yielding 5.45 liters of peritoneal fluid. Read by:  WGareth Eagle PA-C Electronically Signed   By: AMarkus DaftM.D.   On: 12/07/2015 13:08   Dg Chest Port 1 View  Result Date: 12/06/2015 CLINICAL DATA:  Sepsis EXAM: PORTABLE CHEST 1 VIEW COMPARISON:  Chest radiograph from earlier today. FINDINGS:  Right rotated chest radiograph. Stable cardiomediastinal silhouette with cardiomegaly and aortic atherosclerosis. No pneumothorax. No pleural effusion. Hazy and linear diffuse parahilar opacities in both lungs appear stable. IMPRESSION: Stable cardiomegaly. Hazy and linear diffuse parahilar opacities  in both lungs appear stable and could represent mild pulmonary edema or infection. Electronically Signed   By: Ilona Sorrel M.D.   On: 12/06/2015 15:38   Dg Abd Acute W/chest  Result Date: 12/06/2015 CLINICAL DATA:  Abdominal pain.  Dialysis patient. EXAM: DG ABDOMEN ACUTE W/ 1V CHEST COMPARISON:  None. FINDINGS: Right rotated chest radiograph. Mild enlargement of the cardiac silhouette. Mildly tortuous atherosclerotic thoracic aorta. Otherwise normal mediastinal contour. No pneumothorax. No pleural effusion. Mild pulmonary edema. Mild bibasilar scarring versus atelectasis. No disproportionately dilated small bowel loops. Mild-to-moderate stool and gas throughout the colon. No evidence of pneumatosis or pneumoperitoneum. Extensive atherosclerotic calcifications are noted throughout the abdominopelvic vessels. IMPRESSION: 1. Mild enlargement of the cardiac silhouette. Mild pulmonary edema. 2. Mild scarring versus atelectasis of both lung bases. 3. Nonobstructive bowel gas pattern. 4. Aortic atherosclerosis. Electronically Signed   By: Ilona Sorrel M.D.   On: 12/06/2015 09:08    Time Spent in minutes 35   Louellen Molder M.D on 12/08/2015 at 12:05 PM  Between 7am to 7pm - Pager - [phone]  After 7pm go to [email or web address] - password [facility]  [facility] -  Office  [phone]

## 2015-12-08 NOTE — Progress Notes (Signed)
Subjective: Interval History: son says doing better.  Objective: Vital signs in last 24 hours: Temp:  [98 F (36.7 C)-101.9 F (38.8 C)] 100.6 F (38.1 C) (10/27 0808) Pulse Rate:  [64-87] 78 (10/27 0808) Resp:  [16-20] 20 (10/27 0808) BP: (83-118)/(22-48) 113/42 (10/27 0808) SpO2:  [98 %-100 %] 100 % (10/27 0808) Weight change:   Intake/Output from previous day: 10/26 0701 - 10/27 0700 In: 100 [IV Piggyback:100] Out: -  Intake/Output this shift: No intake/output data recorded.  General appearance: cachectic, no distress, uncooperative and L gaze pref Resp: diminished breath sounds bilaterally Cardio: irregularly irregular rhythm and systolic murmur: holosystolic 2/6, blowing at apex GI: soft, non-tender; bowel sounds normal; no masses,  no organomegaly Extremities: AVF LUA b&t  Lab Results:  Recent Labs  12/06/15 0752 12/06/15 0816 12/07/15 0416  WBC 9.7  --  9.9  HGB 12.6 13.9 12.1  HCT 38.4 41.0 36.7  PLT 259  --  215   BMET:  Recent Labs  12/06/15 0752 12/06/15 0816 12/07/15 0416  NA 135 134* 138  K 3.9 3.8 3.7  CL 94* 95* 97*  CO2 26  --  27  GLUCOSE 308* 303* 150*  BUN 16 22* 27*  CREATININE 3.92* 3.90* 5.51*  CALCIUM 9.2  --  9.8   No results for input(s): PTH in the last 72 hours. Iron Studies: No results for input(s): IRON, TIBC, TRANSFERRIN, FERRITIN in the last 72 hours.  Studies/Results: X-ray Chest Pa And Lateral  Result Date: 12/07/2015 CLINICAL DATA:  Sepsis EXAM: CHEST  2 VIEW COMPARISON:  December 06, 2015 FINDINGS: The heart size and mediastinal contours are stable. The heart size is enlarged. There is mild increased pulmonary interstitium bilaterally. There is no focal pneumonia or pleural effusion. There is minimal atelectasis of the lateral left lung base. The visualized skeletal structures are stable. IMPRESSION: Mild interstitial edema.  No focal pneumonia. Electronically Signed   By: Abelardo Diesel M.D.   On: 12/07/2015 18:04   Ct  Head Wo Contrast  Result Date: 12/07/2015 CLINICAL DATA:  Intracranial hemorrhage, multi organ failure. EXAM: CT HEAD WITHOUT CONTRAST TECHNIQUE: Contiguous axial images were obtained from the base of the skull through the vertex without intravenous contrast. COMPARISON:  CT HEAD December 06, 2014 FINDINGS: BRAIN: Moderate ventriculomegaly on the basis of global parenchymal brain volume loss. Similar LEFT temporal occipital lobe 5.9 x 2.4 cm intraparenchymal hematoma with surrounding low-density vasogenic edema. New blurring of the LEFT medial occipital lobe and LEFT thalamus gray-white matter junction, contiguous with the posterior aspect of the hematoma. Wedge-like hypodensity RIGHT superior cerebellum. Evolving RIGHT temporal occipital lobe intraparenchymal hematoma. Local sulcal effacement without midline shift. Gyral density RIGHT and appear temporal occipital lobe. Patchy to confluent white matter hypodensities exclusive of the aforementioned abnormality compatible with moderate chronic small vessel ischemic disease. Old LEFT basal ganglia lacunar infarcts. Scattered sub cm parenchymal calcifications can be seen with prior infection such as neurocysticercosis. No abnormal extra-axial fluid collections. Basal cisterns are patent. VASCULAR: Severe calcific atherosclerosis of the carotid siphons and included vertebral arteries. Scalp vascular calcifications. Dense basilar artery and to lesser extent remaining intracranial vessels. SKULL: No skull fracture. No significant scalp soft tissue swelling. SINUSES/ORBITS: The mastoid air-cells and included paranasal sinuses are well-aerated.Status post RIGHT ocular lens implant. The included ocular globes and orbital contents are non-suspicious. OTHER: Patient is edentulous. 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 Acute RIGHT superior cerebellar artery territory infarct. Evolving bilateral temporal occipital  lobe  intraparenchymal hematomas. Severe atherosclerosis. Dense basilar artery and to lesser extent additional intracranial vessels most compatible with hemoconcentration though, basilar artery thrombosis is a concern given acute posterior circulation infarcts. These results will be called to the ordering clinician or representative by the Radiologist Assistant, and communication documented in the PACS or zVision Dashboard. Electronically Signed   By: Elon Alas M.D.   On: 12/07/2015 06:49   Ct Head Wo Contrast  Result Date: 12/06/2015 CLINICAL DATA:  Increasing confusion and history of recent CVA EXAM: CT HEAD WITHOUT CONTRAST TECHNIQUE: Contiguous axial images were obtained from the base of the skull through the vertex without intravenous contrast. COMPARISON:  None. FINDINGS: Brain: There are areas of parenchymal hemorrhage identified in the right temporo-occipital region as well as in the left temporo-occipital area inferomedially along the tentorium cerebelli. The right-sided area measures approximately 6.3 cm in greatest dimension. The area on the left measures approximately 5.5 cm greatest dimension. Some superior extension towards the left lateral ventricle is noted with some buckling of the lateral ventricular wall inferiorly although no definitive intraventricular component of hemorrhage is seen. Some linear areas are noted along the inferior aspect of the hemorrhage on the right suggesting the possibility of such as subarachnoid component. Some mild white matter edema is noted in the left occipital lobe related to the hemorrhage. Mild atrophic changes are seen. Vascular: No hyperdense vessel or unexpected calcification. Skull: Normal. Negative for fracture or focal lesion. Sinuses/Orbits: No acute finding. Other: None. IMPRESSION: Changes consistent with parenchymal hemorrhage in the temporo-occipital regions bilaterally. There is some suggestion of subarachnoid extension along the inferior aspect  in the right occipital lobe. Critical Value/emergent results were discussed by telephone at the time of interpretation on 12/06/2015 at 4:45 pm with the admitting physician, who verbally acknowledged these results. Electronically Signed   By: Inez Catalina M.D.   On: 12/06/2015 16:46   Ct Abdomen Pelvis W Contrast  Result Date: 12/06/2015 CLINICAL DATA:  Generalized abdominal pain and distention. EXAM: CT ABDOMEN AND PELVIS WITH CONTRAST TECHNIQUE: Multidetector CT imaging of the abdomen and pelvis was performed using the standard protocol following bolus administration of intravenous contrast. CONTRAST:  144mL ISOVUE-300 IOPAMIDOL (ISOVUE-300) INJECTION 61% COMPARISON:  None. FINDINGS: Lower chest: Visualized lung bases are unremarkable. Hepatobiliary: No gallstones are noted. Nodular hepatic margins are noted concerning for hepatic cirrhosis. No focal abnormality is noted within the liver. Pancreas: Normal. Spleen: Irregular low densities are noted within the spleen which are peripheral and may represent old infarctions. Adrenals/Urinary Tract: Adrenal glands appear normal. Severe bilateral renal atrophy is noted consistent with history of end-stage renal disease. Stomach/Bowel: There is no evidence of bowel obstruction. Vascular/Lymphatic: Atherosclerosis of abdominal aorta is noted without aneurysm formation. No significant adenopathy is noted. Reproductive: Uterus and ovaries are unremarkable. Other: Large amount of ascites is noted. Probable large fluid-filled left femoral hernia is noted. 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 IMPRESSION: Nodular hepatic margins are noted concerning for hepatic cirrhosis. Large amount of ascites is noted. Irregular peripheral low densities are noted most consistent with old infarctions. Severe bilateral renal atrophy is noted consistent with history of end-stage renal disease.  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 Aortic atherosclerosis. Probable large fluid-filled left femoral hernia is noted. Electronically Signed   By: Marijo Conception, M.D.   On: 12/06/2015 10:50   US Paracentesis  Result Date: 12/07/2015 INDICATION:  End stage renal disease. Cirrhosis. Ascites. Request for diagnostic and therapeutic paracentesis. EXAM: ULTRASOUND GUIDED RIGHT LATERAL ABDOMEN PARACENTESIS MEDICATIONS: 1% Lidocaine. COMPLICATIONS: None immediate. PROCEDURE: Informed written consent was obtained from the patient after a discussion of the risks, benefits and alternatives to treatment. A timeout was performed prior to the initiation of the procedure. Initial ultrasound scanning demonstrates a large amount of ascites within the right lateral abdomen. The right lateral abdomen was prepped and draped in the usual sterile fashion. 1% lidocaine with epinephrine was used for local anesthesia. Following this, a 19 gauge, 7-cm, Yueh catheter was introduced. An ultrasound image was saved for documentation purposes. The paracentesis was performed. The catheter was removed and a dressing was applied. The patient tolerated the procedure well without immediate post procedural complication. FINDINGS: A total of approximately 5.45 liters of hazy yellow fluid was removed. Samples were sent to the laboratory as requested by the clinical team. IMPRESSION: Successful ultrasound-guided paracentesis yielding 5.45 liters of peritoneal fluid. Read by:  Gareth Eagle, PA-C Electronically Signed   By: Markus Daft M.D.   On: 12/07/2015 13:08   Dg Chest Port 1 View  Result Date: 12/06/2015 CLINICAL DATA:  Sepsis EXAM: PORTABLE CHEST 1 VIEW COMPARISON:  Chest radiograph from earlier today. FINDINGS: Right rotated chest radiograph. Stable cardiomediastinal silhouette with cardiomegaly and aortic atherosclerosis. No pneumothorax. No pleural effusion. Hazy and linear diffuse  parahilar opacities in both lungs appear stable. IMPRESSION: Stable cardiomegaly. Hazy and linear diffuse parahilar opacities in both lungs appear stable and could represent mild pulmonary edema or infection. Electronically Signed   By: Ilona Sorrel M.D.   On: 12/06/2015 15:38    I have reviewed the patient's current medications.  Assessment/Plan: 1 ESRD for HD.  Family with little insight 2 Anemia ok off ESA 3 DM controlled 4 HPTH vit D 5 Cirrhosis 6 ?SBP 7 ICB per neuro P HD, AB, counsel    LOS: 2 days   Fusaye Wachtel L 12/08/2015,9:20 AM

## 2015-12-08 NOTE — Progress Notes (Signed)
Returned from dialysis at this time.

## 2015-12-08 NOTE — Progress Notes (Signed)
Met with family this AM.  Patient is more alert this AM, but still nonverbal.    Family would like to continue with current therapies for a time limited trial to see if she continues to improve. They would like to continue dialysis.  I was very clear about her poor prognosis and that I am concerned that she may continue to decline and this may be a terminal admission.  No aggressive measures or escalation to ICU level care if she continues to decline.  They are also hopeful to have family travel from [place] to see her.  Full consult to follow.  Micheline Rough, MD [facility] Team [phone]

## 2015-12-08 NOTE — Procedures (Signed)
I was present at this session.  I have reviewed the session itself and made appropriate changes.  HD via. LUA AVf. Bps 90s.  Access press ok.   Mariyana Fulop L 10/27/20173:23 PM

## 2015-12-08 NOTE — Consult Note (Addendum)
Consultation Note Date: 12/08/2015   Patient Name: [name]  DOB: January 08, 1941  MRN: [id number]  Age / Sex: 75 y.o., female  PCP: No primary care provider on file. Referring Physician: Louellen Molder, MD  Reason for Consultation: Establishing goals of care  HPI/Patient Profile: 75 y.o. female  with past medical history of cirrhosis, ESLD, and ESRD on dialysis admitted on 12/06/2015 with bilateral parenchymal hemorrhage, sepsis, concern for spontaneous bacterial peritonitis, end-stage renal disease (on dialysis), ascites, and cirrhosis.  She was recently discharged on 11/23/2015 after admission for a CVA.  Palliative consulted for goals of care.   Clinical Assessment and Goals of Care: I met today with patient's 3 sons and grandson. They report the most important thing to [name] is her family.  She has a lot of her family surrounding her locally but also has multiple family members who still reside in [place].  Patient is strictly Spanish-speaking, however, she is not able to communicate at this time due to her medical condition. I met with her 3 sons and grandson. One of her sons and grandson speak [place] fluently. The other 2 have basic conversational skills in [place]. I offered translator services and they declined this today.  We had a long discussion regarding her clinical course to this point in time. This included both their review of current hospital admission as well as recent admission for CVA earlier this month. Discussed multiple organ system involvement and the fact that she has had continued decline in nutrition, functional status, and cognition even prior to this acute event that brought her to the hospital.  Her family reports understanding the severity of her condition. They also note that she seems more awake today than yesterday. We discussed pathways forward including continue with  current therapy versus refocusing care to comfort based approach.  SUMMARY OF RECOMMENDATIONS   - Family wishes to continue with current therapies for time limited trial to see if she continues to improve. We discussed that there is high likelihood she will continue to decompensate, however, family is hopeful even if she does not significantly improve, she will live long enough to have family come and visit her from [place].  I was very clear that it is very possible this will be a terminal admission. - I completed letter at the family's request stating severity of her condition to accompany their petition to [facility] for humanitarian visit from family. - They would like to continue with dialysis at this time. We discussed the purpose of dialysis has been to add time and quality to her life.  We also discussed that I'm concerned that if she does not have significant recovery from her other illnesses, to continue with dialysis may not be adding quality time to her life at this juncture and we will need to revisit this conversation. - DO NOT RESUSCITATE. DO NOT INTUBATE.  No aggressive measures or escalation of care if she continues to decline.  Code Status/Advance Care Planning:  DNR     Symptom Management:  patient currently not able to respond to questions with no signs of physical distress  Palliative Prophylaxis:   Aspiration and Bowel Regimen  Psycho-social/Spiritual:   Desire for further Chaplaincy support:no  Additional Recommendations: Caregiving  Support/Resources  Prognosis:   Unable to determine  Discharge Planning: To Be Determined      Primary Diagnoses: Present on Admission: . Fever . Sepsis ([place])   I have reviewed the medical record, interviewed the patient and family, and examined the patient. The following aspects are pertinent.  History reviewed. No pertinent past medical history. Social History   Social History  . Marital status: Married    Spouse  name: N/A  . Number of children: N/A  . Years of education: N/A   Social History Main Topics  . Smoking status: Never Smoker  . Smokeless tobacco: Never Used  . Alcohol use None  . Drug use: Unknown  . Sexual activity: Not Asked   Other Topics Concern  . None   Social History Narrative  . None   History reviewed. No pertinent family history. Scheduled Meds: . cinacalcet  60 mg Oral Q breakfast  . insulin aspart  0-9 Units Subcutaneous TID WC  . multivitamin  1 tablet Oral QHS  . piperacillin-tazobactam (ZOSYN)  IV  3.375 g Intravenous Q12H  . sodium chloride flush  3 mL Intravenous Q12H  . vancomycin  500 mg Intravenous Q M,W,F-HD   Continuous Infusions:  PRN Meds:.acetaminophen, bisacodyl, HYDROcodone-acetaminophen, ondansetron **OR** ondansetron (ZOFRAN) IV, senna-docusate, traZODone Medications Prior to Admission:  Prior to Admission medications   Medication Sig Start Date End Date Taking? Authorizing Provider  aspirin 325 MG tablet Take 325 mg by mouth every 6 (six) hours as needed for mild pain.   Yes Historical Provider, MD  cinacalcet (SENSIPAR) 60 MG tablet Take 60 mg by mouth daily.   Yes Historical Provider, MD  glipiZIDE (GLUCOTROL) 10 MG tablet Take 10 mg by mouth daily before breakfast.   Yes Historical Provider, MD  labetalol (NORMODYNE) 200 MG tablet Take 200 mg by mouth 2 (two) times daily.   Yes Historical Provider, MD  omeprazole (PRILOSEC) 20 MG capsule Take 20 mg by mouth daily.   Yes Historical Provider, MD  sorbitol 70 % solution Take 15 mLs by mouth daily as needed.   Yes Historical Provider, MD   Not on File Review of Systems  Unable to assess due to mental status   Physical Exam Gen: Elderly thin built female, lethargic but arousable, nonverbal HEENT:, moist mucosa, supple neck Chest: clear b/l, no added sounds CVS: N S1&S2, no murmurs, rubs or gallop GI: soft, NT, ND, BS+ Musculoskeletal: warm, no edema, left upper extremity fistula CNS:  Poorly arousable, nonverbal  Vital Signs: BP (!) 104/51 (BP Location: Right Arm)   Pulse 69   Temp 98.2 F (36.8 C) (Axillary)   Resp (!) 22   Ht 5' (1.524 m) Comment: Per family  Wt 47.2 kg (104 lb 0.9 oz)   SpO2 100%   BMI 20.32 kg/m  Pain Assessment: PAINAD   Pain Score: Asleep   SpO2: SpO2: 100 % O2 Device:SpO2: 100 % O2 Flow Rate: .O2 Flow Rate (L/min): 2 L/min  IO: Intake/output summary:  Intake/Output Summary (Last 24 hours) at 12/08/15 1758 Last data filed at 12/08/15 1641  Gross per 24 hour  Intake                0 ml  Output             -  380 ml  Net              380 ml    LBM: Last BM Date: 12/06/15 Baseline Weight: Weight: 53 kg (116 lb 13.5 oz) Most recent weight: Weight: 47.2 kg (104 lb 0.9 oz)     Palliative Assessment/Data:   Flowsheet Rows   Flowsheet Row Most Recent Value  Intake Tab  Referral Department  Hospitalist  Unit at Time of Referral  Med/Surg Unit  Palliative Care Primary Diagnosis  Neurology  Date Notified  12/07/15  Palliative Care Type  New Palliative care  Date of Admission  12/06/15  Date first seen by Palliative Care  12/07/15  # of days Palliative referral response time  0 Day(s)  # of days IP prior to Palliative referral  1  Clinical Assessment  Palliative Performance Scale Score  10%  Pain Max last 24 hours  Not able to report  Pain Min Last 24 hours  Not able to report  Psychosocial & Spiritual Assessment  Palliative Care Outcomes  Patient/Family meeting held?  Yes  Who was at the meeting?  3 sons, Grandson  Palliative Care Outcomes  Clarified goals of care  Patient/Family wishes: Interventions discontinued/not started   Mechanical Ventilation      Time In: 0655 Time Out: 0810 Time Total: 75 Greater than 50%  of this time was spent counseling and coordinating care related to the above assessment and plan.  Signed by: Micheline Rough, MD   Please contact Palliative Medicine Team phone at [phone] for questions and  concerns.  For individual provider: See Shea Evans

## 2015-12-09 DIAGNOSIS — N186 End stage renal disease: Secondary | ICD-10-CM

## 2015-12-09 DIAGNOSIS — Z992 Dependence on renal dialysis: Secondary | ICD-10-CM

## 2015-12-09 LAB — BASIC METABOLIC PANEL
Anion gap: 14 (ref 5–15)
BUN: 22 mg/dL — AB (ref 6–20)
CALCIUM: 10.8 mg/dL — AB (ref 8.9–10.3)
CO2: 29 mmol/L (ref 22–32)
Chloride: 97 mmol/L — ABNORMAL LOW (ref 101–111)
Creatinine, Ser: 3.72 mg/dL — ABNORMAL HIGH (ref 0.44–1.00)
GFR calc Af Amer: 13 mL/min — ABNORMAL LOW (ref >60)
GFR, EST NON AFRICAN AMERICAN: 11 mL/min — AB (ref >60)
GLUCOSE: 126 mg/dL — AB (ref 65–99)
Potassium: 4.1 mmol/L (ref 3.5–5.1)
SODIUM: 140 mmol/L (ref 135–145)

## 2015-12-09 LAB — GLUCOSE, CAPILLARY: GLUCOSE-CAPILLARY: 131 mg/dL — AB (ref 65–99)

## 2015-12-09 LAB — PATHOLOGIST SMEAR REVIEW

## 2015-12-09 MED ORDER — MORPHINE SULFATE (PF) 2 MG/ML IV SOLN: 1.0000 mg | INTRAVENOUS | Status: DC | PRN

## 2015-12-09 NOTE — Progress Notes (Signed)
Per RN family doesn't want CBG, and tele

## 2015-12-09 NOTE — Progress Notes (Addendum)
RN spoke with Dr. Maudie Mercury with [facility]. Cardiac monitoring and [facility] CBG monitoring discontinued. Per patient family request for patient "not to be "stuck" with needle anymore" if possible. Patient vital signs will continue per unit routine.

## 2015-12-09 NOTE — Progress Notes (Addendum)
Met with patient's 3 sons and daughter-in-law. Son Clinton translated to his siblings. Discussed in detail and explained about her guarded prognosis, unimproved symptoms and inability to tolerate long-term dialysis. The understand that patient has extreme difficulty in tolerating dialysis since she has been hospitalized. There are still hopeful that she may improve. However they do not want her to undergo further dialysis. The understand that patient would deteriorate further without dialysis.  Based on my discussion: 1. No further dialysis. 2. keep patient comfort care, no blood draws,  minimizing medications. Morphine when necessary for pain and dyspnea. 3. continue antibiotics per request. her on feed if she is more awake.   Family also made aware that giving IV fluids to provide nutrition is not an option given her dialysis state. They do not want her on 2 feeds.  They request her to be monitored over the weekend and decide about her disposition (home with hospice versus residential hospice on Monday).

## 2015-12-09 NOTE — Progress Notes (Signed)
Pharmacy Antibiotic Note  [name] is a 75 y.o. female admitted on 12/06/2015 with sepsis.  Pharmacy has been consulted for vancomycin and zosyn dosing. Tmax is 101.8 and WBC is WNL. Pt is currently on HD MWF.   Plan:  Continue vanc 500mg  post-HD Continue zosyn 3.375gm IV Q12H (4 hr inf) F/u renal plans, C&S, clinical status and care plan  Height: 5' (152.4 cm) (Per family) Weight: 104 lb 0.9 oz (47.2 kg) IBW/kg (Calculated) : 45.5  Temp (24hrs), Avg:99.7 F (37.6 C), Min:97.8 F (36.6 C), Max:101.8 F (38.8 C)   Recent Labs Lab 12/06/15 0752 12/06/15 0816 12/06/15 1322 12/06/15 1702 12/06/15 1948 12/07/15 0416 12/08/15 1433 12/08/15 1448 12/09/15 0454  WBC 9.7  --   --   --   --  9.9 8.5  --   --   CREATININE 3.92* 3.90*  --   --   --  5.51*  --  7.73* 3.72*  LATICACIDVEN  --   --  3.00* 2.6* 3.2*  --   --   --   --     Estimated Creatinine Clearance: 9.7 mL/min (by C-G formula based on SCr of 3.72 mg/dL (H)).    Not on File  Antimicrobials this admission:  Vancomycin 10/25 >> Zosyn 10/25 >>  Dose adjustments this admission:  N/A  Microbiology results:  10/25 BCx: NGTD  Thank you for allowing pharmacy to be a part of this patient's care.  Salome Arnt, PharmD, BCPS Pager # [phone] 12/09/2015 10:27 AM

## 2015-12-09 NOTE — Progress Notes (Signed)
Interpreter line used to explain to family that cardiac monitor is being removed, that staff will continue to monitor for sign and symptoms of hyper and hypoglycemia but will no longer do finger sticks per family request. Patients daughter v/u.

## 2015-12-09 NOTE — Progress Notes (Signed)
PROGRESS NOTE                                                                                                                                                                                                             Patient Demographics:    [name], is a 75 y.o. female, DOB - Sep 10, 1940, HWT:[id number]  Admit date - 12/06/2015   Admitting Physician Waldemar Dickens, MD  Outpatient Primary MD for the patient is No primary care provider on file.  LOS - 3  Outpatient Specialists:Renal  Chief Complaint  Patient presents with  . Altered Mental Status       Brief Narrative   75 year old Hispanic female with end-stage renal disease on dialysis,( Tu,Th,Sat), recent hospitalization for CVA (discharged on 11/23/2015 on aspirin), diabetes mellitus, A. fib, hypertension brought to the ED with acute encephalopathy. On presentation she was septic with fever of 102.5 daily Fahrenheit and tachypnea. Blood work showed creatinine of 2.9, glucose of 308 and lactic acid of 3. Abdominal x-ray showed nonobstructive bowel gas pattern, chest x-ray negative for infiltrate. CT abdomen showed hepatic cirrhosis with large amount of ascites. CT head showed parenchymal hemorrhage in the temporalis occipital region bilaterally with possible subarachnoid extension along the inferior accident of the right occipital lobe. Neurosurgeon consulted who recommended she was not a good surgical candidate given her severe comorbidities. Palliative care consulted for goals of care discussion given her overall poor prognosis.   Subjective:   Having temperature spikes. Remains encephalopathy and noncommunicative.   Assessment  & Plan :    Principal Problem:   Sepsis ([place]) No clear source. Suspect SBP. Elevated lactic acid and for calcitonin on admission. On empiric vancomycin and Zosyn. Still febrile. Blood cultures negative. Ascites fluid culture  Negative.  Active Problems: Acute hepatic encephalopathy/bilateral intracerebral hemorrhage with hemorrhagic infarctions. Patient had ascites with elevated ammonia level (70) on admission. Per neurosurgery, Not a candidate for surgery. She remains encephalopathic.   Liver cirrhosis As noted on CT abdomen. No prior history. Treating for SBP.  Essential hypertension  continued labetalol.  Elevated troponin   EKG unremarkable. Peaked at 2.17. Patient unable to communicate. Will  hold off on cardiac workup at present.  End-stage renal disease on dialysis Renal following. Family inclined on continuing dialysis.  Diabetes mellitus,with neuropathy  on sliding insulin coverage.   Goals  of care Oral prognosis is extremely guarded. Palliative care met with family this morning and they would like to continue with current therapy including dialysis for a limited time to see for any improvement. Informed about her poor prognosis. I will reinforce again. She remains to be DO NOT RESUSCITATE.  Will arrange another family meeting today for goals of care. Patient's prognosis is extremely guarded  Code Status : DO NOT RESUSCITATE  Family Communication  : Family at bedside.  Disposition Plan  : Pending hospital course  Barriers For Discharge : Active symptoms  Consults  :  Renal PC CM Neurosurgery (Dr. Arnoldo Morale) Palliative care  Procedures  :  Head CT Paracentesis  DVT Prophylaxis  :  SCDs  Lab Results  Component Value Date   PLT 246 12/08/2015    Antibiotics  :   Anti-infectives    Start     Dose/Rate Route Frequency Ordered Stop   12/08/15 1620  vancomycin (VANCOCIN) 500-5 MG/100ML-% IVPB    Comments:  Hassell, Tamira   : cabinet override      12/08/15 1620 12/08/15 1626   12/08/15 1400  vancomycin (VANCOCIN) IVPB 750 mg/150 ml premix  Status:  Discontinued     750 mg 150 mL/hr over 60 Minutes Intravenous Every 48 hours 12/06/15 1409 12/06/15 1411   12/08/15 1200   vancomycin (VANCOCIN) 500 mg in sodium chloride 0.9 % 100 mL IVPB     500 mg 100 mL/hr over 60 Minutes Intravenous Every M-W-F (Hemodialysis) 12/07/15 1626     12/07/15 1200  vancomycin (VANCOCIN) 500 mg in sodium chloride 0.9 % 100 mL IVPB  Status:  Discontinued     500 mg 100 mL/hr over 60 Minutes Intravenous Every T-Th-Sa (Hemodialysis) 12/06/15 1428 12/07/15 1626   12/07/15 0200  piperacillin-tazobactam (ZOSYN) IVPB 3.375 g     3.375 g 12.5 mL/hr over 240 Minutes Intravenous Every 12 hours 12/06/15 1428     12/06/15 2200  piperacillin-tazobactam (ZOSYN) IVPB 2.25 g  Status:  Discontinued     2.25 g 100 mL/hr over 30 Minutes Intravenous Every 8 hours 12/06/15 1409 12/06/15 1411   12/06/15 1400  piperacillin-tazobactam (ZOSYN) IVPB 3.375 g     3.375 g 100 mL/hr over 30 Minutes Intravenous  Once 12/06/15 1358 12/06/15 1509   12/06/15 1400  vancomycin (VANCOCIN) IVPB 1000 mg/200 mL premix     1,000 mg 200 mL/hr over 60 Minutes Intravenous  Once 12/06/15 1358 12/06/15 1614        Objective:   Vitals:   12/09/15 0412 12/09/15 0606 12/09/15 0617 12/09/15 1000  BP:  (!) 99/30 (!) 106/43 (!) 122/33  Pulse:  75 76 (!) 126  Resp:  20 18   Temp: (!) 100.9 F (38.3 C) 100 F (37.8 C)  99.7 F (37.6 C)  TempSrc: Rectal Rectal  Axillary  SpO2:  100% 100% 100%  Weight:      Height:        Wt Readings from Last 3 Encounters:  12/08/15 47.2 kg (104 lb 0.9 oz)     Intake/Output Summary (Last 24 hours) at 12/09/15 1235 Last data filed at 12/09/15 0332  Gross per 24 hour  Intake              100 ml  Output             -380 ml  Net              480 ml     Physical Exam  Gen: Elderly thin built female, poorly arousable nonverbal HEENT:,  supple neck Chest: clear b/l, no added sounds CVS: N S1&S2, no murmurs, GI: soft, NT, ND, BS+ Musculoskeletal: warm, no edema, left upper extremity fistula CNS: Poorly arousable, nonverbal    Data Review:    CBC  Recent Labs Lab  12/06/15 0752 12/06/15 0816 12/07/15 0416 12/08/15 1433  WBC 9.7  --  9.9 8.5  HGB 12.6 13.9 12.1 11.3*  HCT 38.4 41.0 36.7 34.2*  PLT 259  --  215 246  MCV 99.5  --  98.7 99.1  MCH 32.6  --  32.5 32.8  MCHC 32.8  --  33.0 33.0  RDW 14.5  --  14.5 14.8  LYMPHSABS 0.9  --   --   --   MONOABS 1.0  --   --   --   EOSABS 0.0  --   --   --   BASOSABS 0.1  --   --   --     Chemistries   Recent Labs Lab 12/06/15 0752 12/06/15 0816 12/07/15 0416 12/08/15 1448 12/09/15 0454  NA 135 134* 138 142 140  K 3.9 3.8 3.7 4.7 4.1  CL 94* 95* 97* 99* 97*  CO2 26  --  27 25 29   GLUCOSE 308* 303* 150* 139* 126*  BUN 16 22* 27* 55* 22*  CREATININE 3.92* 3.90* 5.51* 7.73* 3.72*  CALCIUM 9.2  --  9.8 10.3 10.8*  AST 40  --  32  --   --   ALT 12*  --  10*  --   --   ALKPHOS 86  --  70  --   --   BILITOT 0.8  --  0.9  --   --    ------------------------------------------------------------------------------------------------------------------ No results for input(s): CHOL, HDL, LDLCALC, TRIG, CHOLHDL, LDLDIRECT in the last 72 hours.  Lab Results  Component Value Date   HGBA1C 6.7 (H) 12/07/2015   ------------------------------------------------------------------------------------------------------------------ No results for input(s): TSH, T4TOTAL, T3FREE, THYROIDAB in the last 72 hours.  Invalid input(s): FREET3 ------------------------------------------------------------------------------------------------------------------ No results for input(s): VITAMINB12, FOLATE, FERRITIN, TIBC, IRON, RETICCTPCT in the last 72 hours.  Coagulation profile  Recent Labs Lab 12/06/15 0752 12/06/15 1532 12/07/15 0416  INR 1.39 1.56 2.01    No results for input(s): DDIMER in the last 72 hours.  Cardiac Enzymes  Recent Labs Lab 12/06/15 1948 12/06/15 2230 12/07/15 0416  TROPONINI 0.24* 1.01* 2.17*    ------------------------------------------------------------------------------------------------------------------    Component Value Date/Time   BNP 4,306.8 (H) 12/06/2015 1948    Inpatient Medications  Scheduled Meds: . cinacalcet  60 mg Oral Q breakfast  . insulin aspart  0-9 Units Subcutaneous TID WC  . multivitamin  1 tablet Oral QHS  . piperacillin-tazobactam (ZOSYN)  IV  3.375 g Intravenous Q12H  . sodium chloride flush  3 mL Intravenous Q12H  . vancomycin  500 mg Intravenous Q M,W,F-HD   Continuous Infusions:  PRN Meds:.acetaminophen, bisacodyl, HYDROcodone-acetaminophen, ondansetron **OR** ondansetron (ZOFRAN) IV, senna-docusate, traZODone  Micro Results Recent Results (from the past 240 hour(s))  Culture, blood (routine x 2)     Status: None (Preliminary result)   Collection Time: 12/06/15 12:58 PM  Result Value Ref Range Status   Specimen Description BLOOD RIGHT WRIST  Final   Special Requests IN PEDIATRIC BOTTLE  2CC  Final   Culture NO GROWTH 2 DAYS  Final   Report Status PENDING  Incomplete  Culture, blood (routine x 2)     Status: None (Preliminary  result)   Collection Time: 12/06/15  1:05 PM  Result Value Ref Range Status   Specimen Description BLOOD RIGHT HAND  Final   Special Requests IN PEDIATRIC BOTTLE 2CC  Final   Culture NO GROWTH 2 DAYS  Final   Report Status PENDING  Incomplete  Culture, body fluid-bottle     Status: None (Preliminary result)   Collection Time: 12/07/15  2:02 PM  Result Value Ref Range Status   Specimen Description FLUID PERITONEAL  Final   Special Requests NONE  Final   Culture NO GROWTH < 24 HOURS  Final   Report Status PENDING  Incomplete  Gram stain     Status: None   Collection Time: 12/07/15  2:02 PM  Result Value Ref Range Status   Specimen Description FLUID PERITONEAL  Final   Special Requests NONE  Final   Gram Stain   Final    MODERATE WBC PRESENT, PREDOMINANTLY PMN NO ORGANISMS SEEN    Report Status 12/07/2015  FINAL  Final    Radiology Reports X-ray Chest Pa And Lateral  Result Date: 12/07/2015 CLINICAL DATA:  Sepsis EXAM: CHEST  2 VIEW COMPARISON:  December 06, 2015 FINDINGS: The heart size and mediastinal contours are stable. The heart size is enlarged. There is mild increased pulmonary interstitium bilaterally. There is no focal pneumonia or pleural effusion. There is minimal atelectasis of the lateral left lung base. The visualized skeletal structures are stable. IMPRESSION: Mild interstitial edema.  No focal pneumonia. Electronically Signed   By: Abelardo Diesel M.D.   On: 12/07/2015 18:04   Ct Head Wo Contrast  Result Date: 12/07/2015 CLINICAL DATA:  Intracranial hemorrhage, multi organ failure. EXAM: CT HEAD WITHOUT CONTRAST TECHNIQUE: Contiguous axial images were obtained from the base of the skull through the vertex without intravenous contrast. COMPARISON:  CT HEAD December 06, 2014 FINDINGS: BRAIN: Moderate ventriculomegaly on the basis of global parenchymal brain volume loss. Similar LEFT temporal occipital lobe 5.9 x 2.4 cm intraparenchymal hematoma with surrounding low-density vasogenic edema. New blurring of the LEFT medial occipital lobe and LEFT thalamus gray-white matter junction, contiguous with the posterior aspect of the hematoma. Wedge-like hypodensity RIGHT superior cerebellum. Evolving RIGHT temporal occipital lobe intraparenchymal hematoma. Local sulcal effacement without midline shift. Gyral density RIGHT and appear temporal occipital lobe. Patchy to confluent white matter hypodensities exclusive of the aforementioned abnormality compatible with moderate chronic small vessel ischemic disease. Old LEFT basal ganglia lacunar infarcts. Scattered sub cm parenchymal calcifications can be seen with prior infection such as neurocysticercosis. No abnormal extra-axial fluid collections. Basal cisterns are patent. VASCULAR: Severe calcific atherosclerosis of the carotid siphons and included  vertebral arteries. Scalp vascular calcifications. Dense basilar artery and to lesser extent remaining intracranial vessels. SKULL: No skull fracture. No significant scalp soft tissue swelling. SINUSES/ORBITS: The mastoid air-cells and included paranasal sinuses are well-aerated.Status post RIGHT ocular lens implant. The included ocular globes and orbital contents are non-suspicious. OTHER: Patient is edentulous. 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 Acute RIGHT superior cerebellar artery territory infarct. Evolving bilateral temporal occipital lobe intraparenchymal hematomas. Severe atherosclerosis. Dense basilar artery and to lesser extent additional intracranial vessels most compatible with hemoconcentration though, basilar artery thrombosis is a concern given acute posterior circulation infarcts. These results will be called to the ordering clinician or representative by the Radiologist Assistant, and communication documented in the PACS or zVision Dashboard. Electronically Signed   By: Elon Alas M.D.   On: 12/07/2015 06:49  Ct Head Wo Contrast  Result Date: 12/06/2015 CLINICAL DATA:  Increasing confusion and history of recent CVA EXAM: CT HEAD WITHOUT CONTRAST TECHNIQUE: Contiguous axial images were obtained from the base of the skull through the vertex without intravenous contrast. COMPARISON:  None. FINDINGS: Brain: There are areas of parenchymal hemorrhage identified in the right temporo-occipital region as well as in the left temporo-occipital area inferomedially along the tentorium cerebelli. The right-sided area measures approximately 6.3 cm in greatest dimension. The area on the left measures approximately 5.5 cm greatest dimension. Some superior extension towards the left lateral ventricle is noted with some buckling of the lateral ventricular wall inferiorly although no definitive intraventricular component of hemorrhage is seen. Some  linear areas are noted along the inferior aspect of the hemorrhage on the right suggesting the possibility of such as subarachnoid component. Some mild white matter edema is noted in the left occipital lobe related to the hemorrhage. Mild atrophic changes are seen. Vascular: No hyperdense vessel or unexpected calcification. Skull: Normal. Negative for fracture or focal lesion. Sinuses/Orbits: No acute finding. Other: None. IMPRESSION: Changes consistent with parenchymal hemorrhage in the temporo-occipital regions bilaterally. There is some suggestion of subarachnoid extension along the inferior aspect in the right occipital lobe. Critical Value/emergent results were discussed by telephone at the time of interpretation on 12/06/2015 at 4:45 pm with the admitting physician, who verbally acknowledged these results. Electronically Signed   By: Inez Catalina M.D.   On: 12/06/2015 16:46   Ct Abdomen Pelvis W Contrast  Result Date: 12/06/2015 CLINICAL DATA:  Generalized abdominal pain and distention. EXAM: CT ABDOMEN AND PELVIS WITH CONTRAST TECHNIQUE: Multidetector CT imaging of the abdomen and pelvis was performed using the standard protocol following bolus administration of intravenous contrast. CONTRAST:  151m ISOVUE-300 IOPAMIDOL (ISOVUE-300) INJECTION 61% COMPARISON:  None. FINDINGS: Lower chest: Visualized lung bases are unremarkable. Hepatobiliary: No gallstones are noted. Nodular hepatic margins are noted concerning for hepatic cirrhosis. No focal abnormality is noted within the liver. Pancreas: Normal. Spleen: Irregular low densities are noted within the spleen which are peripheral and may represent old infarctions. Adrenals/Urinary Tract: Adrenal glands appear normal. Severe bilateral renal atrophy is noted consistent with history of end-stage renal disease. Stomach/Bowel: There is no evidence of bowel obstruction. Vascular/Lymphatic: Atherosclerosis of abdominal aorta is noted without aneurysm formation.  No significant adenopathy is noted. Reproductive: Uterus and ovaries are unremarkable. Other: Large amount of ascites is noted. Probable large fluid-filled left femoral hernia is noted. 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 IMPRESSION: Nodular hepatic margins are noted concerning for hepatic cirrhosis. Large amount of ascites is noted. Irregular peripheral low densities are noted most consistent with old infarctions. Severe bilateral renal atrophy is noted consistent with history of end-stage renal disease. 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 Aortic atherosclerosis. Probable large fluid-filled left femoral hernia is noted. Electronically Signed   By: JMarijo Conception M.D.   On: 12/06/2015 10:50   UKoreaParacentesis  Result Date: 12/07/2015 INDICATION: End stage renal disease. Cirrhosis. Ascites. Request for diagnostic and therapeutic paracentesis. EXAM: ULTRASOUND GUIDED RIGHT LATERAL ABDOMEN PARACENTESIS MEDICATIONS: 1% Lidocaine. COMPLICATIONS: None immediate. PROCEDURE: Informed written consent was obtained from the patient after a discussion of the risks, benefits and alternatives to treatment. A timeout was performed prior to the initiation of the procedure. Initial ultrasound scanning demonstrates a large amount of ascites within the right lateral abdomen. The right  lateral abdomen was prepped and draped in the usual sterile fashion. 1% lidocaine with epinephrine was used for local anesthesia. Following this, a 19 gauge, 7-cm, Yueh catheter was introduced. An ultrasound image was saved for documentation purposes. The paracentesis was performed. The catheter was removed and a dressing was applied. The patient tolerated the procedure well without immediate post procedural complication. FINDINGS: A total of approximately 5.45 liters of hazy yellow fluid was  removed. Samples were sent to the laboratory as requested by the clinical team. IMPRESSION: Successful ultrasound-guided paracentesis yielding 5.45 liters of peritoneal fluid. Read by:  Gareth Eagle, PA-C Electronically Signed   By: Markus Daft M.D.   On: 12/07/2015 13:08   Dg Chest Port 1 View  Result Date: 12/06/2015 CLINICAL DATA:  Sepsis EXAM: PORTABLE CHEST 1 VIEW COMPARISON:  Chest radiograph from earlier today. FINDINGS: Right rotated chest radiograph. Stable cardiomediastinal silhouette with cardiomegaly and aortic atherosclerosis. No pneumothorax. No pleural effusion. Hazy and linear diffuse parahilar opacities in both lungs appear stable. IMPRESSION: Stable cardiomegaly. Hazy and linear diffuse parahilar opacities in both lungs appear stable and could represent mild pulmonary edema or infection. Electronically Signed   By: Ilona Sorrel M.D.   On: 12/06/2015 15:38   Dg Abd Acute W/chest  Result Date: 12/06/2015 CLINICAL DATA:  Abdominal pain.  Dialysis patient. EXAM: DG ABDOMEN ACUTE W/ 1V CHEST COMPARISON:  None. FINDINGS: Right rotated chest radiograph. Mild enlargement of the cardiac silhouette. Mildly tortuous atherosclerotic thoracic aorta. Otherwise normal mediastinal contour. No pneumothorax. No pleural effusion. Mild pulmonary edema. Mild bibasilar scarring versus atelectasis. No disproportionately dilated small bowel loops. Mild-to-moderate stool and gas throughout the colon. No evidence of pneumatosis or pneumoperitoneum. Extensive atherosclerotic calcifications are noted throughout the abdominopelvic vessels. IMPRESSION: 1. Mild enlargement of the cardiac silhouette. Mild pulmonary edema. 2. Mild scarring versus atelectasis of both lung bases. 3. Nonobstructive bowel gas pattern. 4. Aortic atherosclerosis. Electronically Signed   By: Ilona Sorrel M.D.   On: 12/06/2015 09:08    Time Spent in minutes 35   Louellen Molder M.D on 12/09/2015 at 12:35 PM  Between 7am to 7pm - Pager -  [phone]  After 7pm go to [email or web address] - password [facility]  [facility] -  Office  [phone]

## 2015-12-09 NOTE — Progress Notes (Signed)
RN spoke with [facility], patients son, who request to speak with MD. Patients son states "family cannot let patient starve" "that is not okay." Family is reconsidering decisions made today regarding current treatment plan. Patient family requesting meeting. RN attempted to provide education regarding nutritional demands at end of life, the risks of patient receiving IV fluids, and patients risk for aspiration. Patient son states he does not want to talk to anyone except MD. RN respected patient family request.

## 2015-12-09 NOTE — Progress Notes (Signed)
Patient axilary temperature 100.6. Patient given 650 mg Tylenol rectally. RN will continue to monitor.

## 2015-12-09 NOTE — Progress Notes (Signed)
Notes from family meeting from this afternoon with her children and Dr. Clementeen Graham reviewed in chart.   I checked on her briefly for symptom management.  She was resting comfortably at time of my exam.  Will continue to follow for symptoms and assist with with disposition as possible.  NO CHARGE NOTE.  Micheline Rough, MD [facility] [phone]

## 2015-12-09 NOTE — Progress Notes (Signed)
Subjective: Interval History: On AB, no clinical change  Objective: Vital signs in last 24 hours: Temp:  [97.8 F (36.6 C)-101.8 F (38.8 C)] 100 F (37.8 C) (10/28 0606) Pulse Rate:  [69-86] 76 (10/28 0617) Resp:  [18-24] 18 (10/28 0617) BP: (88-123)/(23-51) 106/43 (10/28 0617) SpO2:  [100 %] 100 % (10/28 0617) Weight:  [47.2 kg (104 lb 0.9 oz)] 47.2 kg (104 lb 0.9 oz) (10/27 1641) Weight change:   Intake/Output from previous day: 10/27 0701 - 10/28 0700 In: 100 [IV Piggyback:100] Out: -380  Intake/Output this shift: No intake/output data recorded.  General appearance: gaze to L, not interactive Resp: diminished breath sounds bilaterally Cardio: irregularly irregular rhythm and systolic murmur: systolic ejection 2/6, decrescendo at 2nd left intercostal space GI: pos bs, mild distension, soft Extremities: edema Tr and LUA AVF  Lab Results:  Recent Labs  12/07/15 0416 12/08/15 1433  WBC 9.9 8.5  HGB 12.1 11.3*  HCT 36.7 34.2*  PLT 215 246   BMET:  Recent Labs  12/08/15 1448 12/09/15 0454  NA 142 140  K 4.7 4.1  CL 99* 97*  CO2 25 29  GLUCOSE 139* 126*  BUN 55* 22*  CREATININE 7.73* 3.72*  CALCIUM 10.3 10.8*   No results for input(s): PTH in the last 72 hours. Iron Studies: No results for input(s): IRON, TIBC, TRANSFERRIN, FERRITIN in the last 72 hours.  Studies/Results: X-ray Chest Pa And Lateral  Result Date: 12/07/2015 CLINICAL DATA:  Sepsis EXAM: CHEST  2 VIEW COMPARISON:  December 06, 2015 FINDINGS: The heart size and mediastinal contours are stable. The heart size is enlarged. There is mild increased pulmonary interstitium bilaterally. There is no focal pneumonia or pleural effusion. There is minimal atelectasis of the lateral left lung base. The visualized skeletal structures are stable. IMPRESSION: Mild interstitial edema.  No focal pneumonia. Electronically Signed   By: Abelardo Diesel M.D.   On: 12/07/2015 18:04   US Paracentesis  Result Date:  12/07/2015 INDICATION: End stage renal disease. Cirrhosis. Ascites. Request for diagnostic and therapeutic paracentesis. EXAM: ULTRASOUND GUIDED RIGHT LATERAL ABDOMEN PARACENTESIS MEDICATIONS: 1% Lidocaine. COMPLICATIONS: None immediate. PROCEDURE: Informed written consent was obtained from the patient after a discussion of the risks, benefits and alternatives to treatment. A timeout was performed prior to the initiation of the procedure. Initial ultrasound scanning demonstrates a large amount of ascites within the right lateral abdomen. The right lateral abdomen was prepped and draped in the usual sterile fashion. 1% lidocaine with epinephrine was used for local anesthesia. Following this, a 19 gauge, 7-cm, Yueh catheter was introduced. An ultrasound image was saved for documentation purposes. The paracentesis was performed. The catheter was removed and a dressing was applied. The patient tolerated the procedure well without immediate post procedural complication. FINDINGS: A total of approximately 5.45 liters of hazy yellow fluid was removed. Samples were sent to the laboratory as requested by the clinical team. IMPRESSION: Successful ultrasound-guided paracentesis yielding 5.45 liters of peritoneal fluid. Read by:  Gareth Eagle, PA-C Electronically Signed   By: Markus Daft M.D.   On: 12/07/2015 13:08    I have reviewed the patient's current medications.  Assessment/Plan: 1 ESRD did well on HD 2 Anemia  3 HPTH  ^ Ca On [place], use low Ca bath 4 CVAs 5 ICB bilat 6 Cirrhosis 7 Debill P PC seeing, very poor QOL . Outlook for meaningful rcovery small    LOS: 3 days   Ahren Pettinger L 12/09/2015,9:06 AM

## 2015-12-09 NOTE — Progress Notes (Signed)
[facility] oncall MD paged in order to clarify patients current medical orders following family meeting that took place today. RN will continue cardiac monitoring, CBGs ACHS and q 4 hour vital signs until clarification received by MD.

## 2015-12-09 NOTE — Progress Notes (Signed)
Patient resting in bed with family at beside. No noted discomfort or restlessness.  Patient turned Q 2 hours.  Two loose stools noted.  Oxygen at 2LPM, sats above 90%.  Family supportive.

## 2015-12-09 NOTE — Progress Notes (Signed)
Patient axillary temp 101.8, unrelieved by tylenol. MD notified. Blankets removed, cool cloth placed posterior neck and forehead. RN will continue to monitor.

## 2015-12-10 MED ORDER — DEXTROSE 5 % IV SOLN
2.0000 g | INTRAVENOUS | Status: DC
  Filled 2015-12-10: qty 2

## 2015-12-10 NOTE — Progress Notes (Addendum)
PROGRESS NOTE                                                                                                                                                                                                             Patient Demographics:    [name], is a 75 y.o. female, DOB - 10-Jan-1941, [id number]  Admit date - 12/06/2015   Admitting Physician Waldemar Dickens, MD  Outpatient Primary MD for the patient is No primary care provider on file.  LOS - 4  Outpatient Specialists:Renal  Chief Complaint  Patient presents with  . Altered Mental Status       Brief Narrative   75 year old Hispanic female with end-stage renal disease on dialysis,( Tu,Th,Sat), recent hospitalization for CVA (discharged on 11/23/2015 on aspirin), diabetes mellitus, A. fib, hypertension brought to the ED with acute encephalopathy. On presentation she was septic with fever of 102.5 daily Fahrenheit and tachypnea. Blood work showed creatinine of 2.9, glucose of 308 and lactic acid of 3. Abdominal x-ray showed nonobstructive bowel gas pattern, chest x-ray negative for infiltrate. CT abdomen showed hepatic cirrhosis with large amount of ascites. CT head showed parenchymal hemorrhage in the temporalis occipital region bilaterally with possible subarachnoid extension along the inferior accident of the right occipital lobe. Neurosurgeon consulted who recommended she was not a good surgical candidate given her severe comorbidities. Palliative care consulted for goals of care discussion given her overall poor prognosis.   Subjective:   Fever of 102F. Had a long conversation ( almost 61 mins ) with sons with phone interpreter. They were curious about means of feeding her. I have expressed to then that artificial means of feeding is not an option including IV fluids as goal is now for comfort. Approved for comfort feeds and they are ok with it.    Assessment  & Plan :    Principal Problem:   Sepsis ([place]) No clear source. Suspect SBP. Elevated lactic acid and for calcitonin on admission. On empiric vancomycin and Zosyn. Narrow antibiotic to Rocephin. Blood cultures and ascites fluid culture negative.   Active Problems: Acute hepatic encephalopathy/bilateral intracerebral hemorrhage with hemorrhagic infarctions. Patient had ascites with elevated ammonia level (70) on admission. Per neurosurgery, Not a candidate for surgery. She remains encephalopathic with overall poor prognosis.   Liver cirrhosis As noted on CT abdomen. No prior  history. Treating for SBP.  Essential hypertension  continued labetalol.  Elevated troponin   EKG unremarkable. Peaked at 2.17. Patient unable to communicate. Will  hold off on cardiac workup at present.  End-stage renal disease on dialysis Renal following. Family inclined on continuing dialysis.  Diabetes mellitus,with neuropathy  on sliding insulin coverage.   Goals of care Oral prognosis extremely guarded. Discussed again extensively with family (patient's 3 sons on 10/28.) They understand her guarded prognosis and poor quality of life. Based on my discussion: 1. No further dialysis. 2.  comfort care, no further blood draws,  minimizing medications. Morphine when necessary for pain and dyspnea. 3. continue antibiotics per request. Comfort  feed if tolerated.       Code Status : DO NOT RESUSCITATE  Family Communication  : Family at bedside.  Disposition Plan  : home vs residential hospice on 10/30  Barriers For Discharge : Active symptoms  Consults  :  Renal PCCM Neurosurgery (Dr. Arnoldo Morale) Palliative care  Procedures  :  Head CT Paracentesis  DVT Prophylaxis  :  SCDs  Lab Results  Component Value Date   PLT 246 12/08/2015    Antibiotics  :   Anti-infectives    Start     Dose/Rate Route Frequency Ordered Stop   12/08/15 1620  vancomycin (VANCOCIN) 500-5 MG/100ML-%  IVPB    Comments:  Hassell, Tamira   : cabinet override      12/08/15 1620 12/08/15 1626   12/08/15 1400  vancomycin (VANCOCIN) IVPB 750 mg/150 ml premix  Status:  Discontinued     750 mg 150 mL/hr over 60 Minutes Intravenous Every 48 hours 12/06/15 1409 12/06/15 1411   12/08/15 1200  vancomycin (VANCOCIN) 500 mg in sodium chloride 0.9 % 100 mL IVPB     500 mg 100 mL/hr over 60 Minutes Intravenous Every M-W-F (Hemodialysis) 12/07/15 1626     12/07/15 1200  vancomycin (VANCOCIN) 500 mg in sodium chloride 0.9 % 100 mL IVPB  Status:  Discontinued     500 mg 100 mL/hr over 60 Minutes Intravenous Every T-Th-Sa (Hemodialysis) 12/06/15 1428 12/07/15 1626   12/07/15 0200  piperacillin-tazobactam (ZOSYN) IVPB 3.375 g     3.375 g 12.5 mL/hr over 240 Minutes Intravenous Every 12 hours 12/06/15 1428     12/06/15 2200  piperacillin-tazobactam (ZOSYN) IVPB 2.25 g  Status:  Discontinued     2.25 g 100 mL/hr over 30 Minutes Intravenous Every 8 hours 12/06/15 1409 12/06/15 1411   12/06/15 1400  piperacillin-tazobactam (ZOSYN) IVPB 3.375 g     3.375 g 100 mL/hr over 30 Minutes Intravenous  Once 12/06/15 1358 12/06/15 1509   12/06/15 1400  vancomycin (VANCOCIN) IVPB 1000 mg/200 mL premix     1,000 mg 200 mL/hr over 60 Minutes Intravenous  Once 12/06/15 1358 12/06/15 1614        Objective:   Vitals:   12/09/15 1000 12/09/15 2100 12/10/15 0100 12/10/15 0500  BP: (!) 122/33 (!) 132/34 (!) 144/42 (!) 153/44  Pulse: (!) 126 89 78 88  Resp:  14 14 14   Temp: 99.7 F (37.6 C) 99.3 F (37.4 C) 99.7 F (37.6 C) 98.5 F (36.9 C)  TempSrc: Axillary Axillary Axillary Axillary  SpO2: 100% 99% 97% 100%  Weight:      Height:        Wt Readings from Last 3 Encounters:  12/08/15 47.2 kg (104 lb 0.9 oz)    No intake or output data in the 24 hours ending 12/10/15 1041  Physical Exam  Gen:  poorly arousable nonverbal HEENT:,  supple neck Chest: clear b/l, no added sounds CVS: N S1&S2, no  murmurs, GI: soft, NT, ND Musculoskeletal: warm, no edema CNS: Poorly arousable, nonverbal    Data Review:    CBC  Recent Labs Lab 12/06/15 0752 12/06/15 0816 12/07/15 0416 12/08/15 1433  WBC 9.7  --  9.9 8.5  HGB 12.6 13.9 12.1 11.3*  HCT 38.4 41.0 36.7 34.2*  PLT 259  --  215 246  MCV 99.5  --  98.7 99.1  MCH 32.6  --  32.5 32.8  MCHC 32.8  --  33.0 33.0  RDW 14.5  --  14.5 14.8  LYMPHSABS 0.9  --   --   --   MONOABS 1.0  --   --   --   EOSABS 0.0  --   --   --   BASOSABS 0.1  --   --   --     Chemistries   Recent Labs Lab 12/06/15 0752 12/06/15 0816 12/07/15 0416 12/08/15 1448 12/09/15 0454  NA 135 134* 138 142 140  K 3.9 3.8 3.7 4.7 4.1  CL 94* 95* 97* 99* 97*  CO2 26  --  27 25 29   GLUCOSE 308* 303* 150* 139* 126*  BUN 16 22* 27* 55* 22*  CREATININE 3.92* 3.90* 5.51* 7.73* 3.72*  CALCIUM 9.2  --  9.8 10.3 10.8*  AST 40  --  32  --   --   ALT 12*  --  10*  --   --   ALKPHOS 86  --  70  --   --   BILITOT 0.8  --  0.9  --   --    ------------------------------------------------------------------------------------------------------------------ No results for input(s): CHOL, HDL, LDLCALC, TRIG, CHOLHDL, LDLDIRECT in the last 72 hours.  Lab Results  Component Value Date   HGBA1C 6.7 (H) 12/07/2015   ------------------------------------------------------------------------------------------------------------------ No results for input(s): TSH, T4TOTAL, T3FREE, THYROIDAB in the last 72 hours.  Invalid input(s): FREET3 ------------------------------------------------------------------------------------------------------------------ No results for input(s): VITAMINB12, FOLATE, FERRITIN, TIBC, IRON, RETICCTPCT in the last 72 hours.  Coagulation profile  Recent Labs Lab 12/06/15 0752 12/06/15 1532 12/07/15 0416  INR 1.39 1.56 2.01    No results for input(s): DDIMER in the last 72 hours.  Cardiac Enzymes  Recent Labs Lab 12/06/15 1948  12/06/15 2230 12/07/15 0416  TROPONINI 0.24* 1.01* 2.17*   ------------------------------------------------------------------------------------------------------------------    Component Value Date/Time   BNP 4,306.8 (H) 12/06/2015 1948    Inpatient Medications  Scheduled Meds: . piperacillin-tazobactam (ZOSYN)  IV  3.375 g Intravenous Q12H  . sodium chloride flush  3 mL Intravenous Q12H  . vancomycin  500 mg Intravenous Q M,W,F-HD   Continuous Infusions:  PRN Meds:.acetaminophen, bisacodyl, morphine injection, ondansetron **OR** ondansetron (ZOFRAN) IV  Micro Results Recent Results (from the past 240 hour(s))  Culture, blood (routine x 2)     Status: None (Preliminary result)   Collection Time: 12/06/15 12:58 PM  Result Value Ref Range Status   Specimen Description BLOOD RIGHT WRIST  Final   Special Requests IN PEDIATRIC BOTTLE  2CC  Final   Culture NO GROWTH 3 DAYS  Final   Report Status PENDING  Incomplete  Culture, blood (routine x 2)     Status: None (Preliminary result)   Collection Time: 12/06/15  1:05 PM  Result Value Ref Range Status   Specimen Description BLOOD RIGHT HAND  Final   Special Requests IN PEDIATRIC BOTTLE [facility]  Final   Culture NO GROWTH 3 DAYS  Final   Report Status PENDING  Incomplete  Culture, body fluid-bottle     Status: None (Preliminary result)   Collection Time: 12/07/15  2:02 PM  Result Value Ref Range Status   Specimen Description FLUID PERITONEAL  Final   Special Requests NONE  Final   Culture NO GROWTH 2 DAYS  Final   Report Status PENDING  Incomplete  Gram stain     Status: None   Collection Time: 12/07/15  2:02 PM  Result Value Ref Range Status   Specimen Description FLUID PERITONEAL  Final   Special Requests NONE  Final   Gram Stain   Final    MODERATE WBC PRESENT, PREDOMINANTLY PMN NO ORGANISMS SEEN    Report Status 12/07/2015 FINAL  Final    Radiology Reports X-ray Chest Pa And Lateral  Result Date: 12/07/2015 CLINICAL  DATA:  Sepsis EXAM: CHEST  2 VIEW COMPARISON:  December 06, 2015 FINDINGS: The heart size and mediastinal contours are stable. The heart size is enlarged. There is mild increased pulmonary interstitium bilaterally. There is no focal pneumonia or pleural effusion. There is minimal atelectasis of the lateral left lung base. The visualized skeletal structures are stable. IMPRESSION: Mild interstitial edema.  No focal pneumonia. Electronically Signed   By: Abelardo Diesel M.D.   On: 12/07/2015 18:04   Ct Head Wo Contrast  Result Date: 12/07/2015 CLINICAL DATA:  Intracranial hemorrhage, multi organ failure. EXAM: CT HEAD WITHOUT CONTRAST TECHNIQUE: Contiguous axial images were obtained from the base of the skull through the vertex without intravenous contrast. COMPARISON:  CT HEAD December 06, 2014 FINDINGS: BRAIN: Moderate ventriculomegaly on the basis of global parenchymal brain volume loss. Similar LEFT temporal occipital lobe 5.9 x 2.4 cm intraparenchymal hematoma with surrounding low-density vasogenic edema. New blurring of the LEFT medial occipital lobe and LEFT thalamus gray-white matter junction, contiguous with the posterior aspect of the hematoma. Wedge-like hypodensity RIGHT superior cerebellum. Evolving RIGHT temporal occipital lobe intraparenchymal hematoma. Local sulcal effacement without midline shift. Gyral density RIGHT and appear temporal occipital lobe. Patchy to confluent white matter hypodensities exclusive of the aforementioned abnormality compatible with moderate chronic small vessel ischemic disease. Old LEFT basal ganglia lacunar infarcts. Scattered sub cm parenchymal calcifications can be seen with prior infection such as neurocysticercosis. No abnormal extra-axial fluid collections. Basal cisterns are patent. VASCULAR: Severe calcific atherosclerosis of the carotid siphons and included vertebral arteries. Scalp vascular calcifications. Dense basilar artery and to lesser extent remaining  intracranial vessels. SKULL: No skull fracture. No significant scalp soft tissue swelling. SINUSES/ORBITS: The mastoid air-cells and included paranasal sinuses are well-aerated.Status post RIGHT ocular lens implant. The included ocular globes and orbital contents are non-suspicious. OTHER: Patient is edentulous. 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 Acute RIGHT superior cerebellar artery territory infarct. Evolving bilateral temporal occipital lobe intraparenchymal hematomas. Severe atherosclerosis. Dense basilar artery and to lesser extent additional intracranial vessels most compatible with hemoconcentration though, basilar artery thrombosis is a concern given acute posterior circulation infarcts. These results will be called to the ordering clinician or representative by the Radiologist Assistant, and communication documented in the PACS or zVision Dashboard. Electronically Signed   By: Elon Alas M.D.   On: 12/07/2015 06:49   Ct Head Wo Contrast  Result Date: 12/06/2015 CLINICAL DATA:  Increasing confusion and history of recent CVA EXAM: CT HEAD WITHOUT CONTRAST TECHNIQUE: Contiguous axial images were obtained from the base of the  skull through the vertex without intravenous contrast. COMPARISON:  None. FINDINGS: Brain: There are areas of parenchymal hemorrhage identified in the right temporo-occipital region as well as in the left temporo-occipital area inferomedially along the tentorium cerebelli. The right-sided area measures approximately 6.3 cm in greatest dimension. The area on the left measures approximately 5.5 cm greatest dimension. Some superior extension towards the left lateral ventricle is noted with some buckling of the lateral ventricular wall inferiorly although no definitive intraventricular component of hemorrhage is seen. Some linear areas are noted along the inferior aspect of the hemorrhage on the right suggesting the possibility  of such as subarachnoid component. Some mild white matter edema is noted in the left occipital lobe related to the hemorrhage. Mild atrophic changes are seen. Vascular: No hyperdense vessel or unexpected calcification. Skull: Normal. Negative for fracture or focal lesion. Sinuses/Orbits: No acute finding. Other: None. IMPRESSION: Changes consistent with parenchymal hemorrhage in the temporo-occipital regions bilaterally. There is some suggestion of subarachnoid extension along the inferior aspect in the right occipital lobe. Critical Value/emergent results were discussed by telephone at the time of interpretation on 12/06/2015 at 4:45 pm with the admitting physician, who verbally acknowledged these results. Electronically Signed   By: Inez Catalina M.D.   On: 12/06/2015 16:46   Ct Abdomen Pelvis W Contrast  Result Date: 12/06/2015 CLINICAL DATA:  Generalized abdominal pain and distention. EXAM: CT ABDOMEN AND PELVIS WITH CONTRAST TECHNIQUE: Multidetector CT imaging of the abdomen and pelvis was performed using the standard protocol following bolus administration of intravenous contrast. CONTRAST:  119mL ISOVUE-300 IOPAMIDOL (ISOVUE-300) INJECTION 61% COMPARISON:  None. FINDINGS: Lower chest: Visualized lung bases are unremarkable. Hepatobiliary: No gallstones are noted. Nodular hepatic margins are noted concerning for hepatic cirrhosis. No focal abnormality is noted within the liver. Pancreas: Normal. Spleen: Irregular low densities are noted within the spleen which are peripheral and may represent old infarctions. Adrenals/Urinary Tract: Adrenal glands appear normal. Severe bilateral renal atrophy is noted consistent with history of end-stage renal disease. Stomach/Bowel: There is no evidence of bowel obstruction. Vascular/Lymphatic: Atherosclerosis of abdominal aorta is noted without aneurysm formation. No significant adenopathy is noted. Reproductive: Uterus and ovaries are unremarkable. Other: Large amount  of ascites is noted. Probable large fluid-filled left femoral hernia is noted. 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 IMPRESSION: Nodular hepatic margins are noted concerning for hepatic cirrhosis. Large amount of ascites is noted. Irregular peripheral low densities are noted most consistent with old infarctions. Severe bilateral renal atrophy is noted consistent with history of end-stage renal disease. 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 Aortic atherosclerosis. Probable large fluid-filled left femoral hernia is noted. Electronically Signed   By: Marijo Conception, M.D.   On: 12/06/2015 10:50   US Paracentesis  Result Date: 12/07/2015 INDICATION: End stage renal disease. Cirrhosis. Ascites. Request for diagnostic and therapeutic paracentesis. EXAM: ULTRASOUND GUIDED RIGHT LATERAL ABDOMEN PARACENTESIS MEDICATIONS: 1% Lidocaine. COMPLICATIONS: None immediate. PROCEDURE: Informed written consent was obtained from the patient after a discussion of the risks, benefits and alternatives to treatment. A timeout was performed prior to the initiation of the procedure. Initial ultrasound scanning demonstrates a large amount of ascites within the right lateral abdomen. The right lateral abdomen was prepped and draped in the usual sterile fashion. 1% lidocaine with epinephrine was used for local anesthesia. Following this, a 19 gauge, 7-cm, Yueh catheter was introduced. An ultrasound image was  saved for documentation purposes. The paracentesis was performed. The catheter was removed and a dressing was applied. The patient tolerated the procedure well without immediate post procedural complication. FINDINGS: A total of approximately 5.45 liters of hazy yellow fluid was removed. Samples were sent to the laboratory as requested by the clinical team. IMPRESSION: Successful  ultrasound-guided paracentesis yielding 5.45 liters of peritoneal fluid. Read by:  Gareth Eagle, PA-C Electronically Signed   By: Markus Daft M.D.   On: 12/07/2015 13:08   Dg Chest Port 1 View  Result Date: 12/06/2015 CLINICAL DATA:  Sepsis EXAM: PORTABLE CHEST 1 VIEW COMPARISON:  Chest radiograph from earlier today. FINDINGS: Right rotated chest radiograph. Stable cardiomediastinal silhouette with cardiomegaly and aortic atherosclerosis. No pneumothorax. No pleural effusion. Hazy and linear diffuse parahilar opacities in both lungs appear stable. IMPRESSION: Stable cardiomegaly. Hazy and linear diffuse parahilar opacities in both lungs appear stable and could represent mild pulmonary edema or infection. Electronically Signed   By: Ilona Sorrel M.D.   On: 12/06/2015 15:38   Dg Abd Acute W/chest  Result Date: 12/06/2015 CLINICAL DATA:  Abdominal pain.  Dialysis patient. EXAM: DG ABDOMEN ACUTE W/ 1V CHEST COMPARISON:  None. FINDINGS: Right rotated chest radiograph. Mild enlargement of the cardiac silhouette. Mildly tortuous atherosclerotic thoracic aorta. Otherwise normal mediastinal contour. No pneumothorax. No pleural effusion. Mild pulmonary edema. Mild bibasilar scarring versus atelectasis. No disproportionately dilated small bowel loops. Mild-to-moderate stool and gas throughout the colon. No evidence of pneumatosis or pneumoperitoneum. Extensive atherosclerotic calcifications are noted throughout the abdominopelvic vessels. IMPRESSION: 1. Mild enlargement of the cardiac silhouette. Mild pulmonary edema. 2. Mild scarring versus atelectasis of both lung bases. 3. Nonobstructive bowel gas pattern. 4. Aortic atherosclerosis. Electronically Signed   By: Ilona Sorrel M.D.   On: 12/06/2015 09:08    Time Spent in minutes 25   Louellen Molder M.D on 12/10/2015 at 10:41 AM  Between 7am to 7pm - Pager - [phone]  After 7pm go to [email or web address] - password [facility]  [facility] -  Office   [phone]

## 2015-12-10 NOTE — Progress Notes (Signed)
Patient with 102 temp axillary.  Tylenol 650 suppository given per order.

## 2015-12-10 NOTE — Progress Notes (Signed)
Interpreter ID [phone] RN ensured patients family understood purpose of labs patient family refused this morning. Dr. Margaretmary Dys most recent progress note from 12/09/15 read verbatim and translated to patient family. Patients son, Myna Bright, v/u of family and MD discussion that took place 12/09/15. RN informed Antonio via interpreter documentation of his verbal understanding of lab draw purpose, lab refusal, family request for patient to not be "stuck" anymore and son Myna Bright) request to speak to MD again regarding patients treatment plan. Antonio v/u.

## 2015-12-10 NOTE — Progress Notes (Signed)
Patient ID: [name], female   DOB: Feb 15, 1940, 75 y.o.   MRN: [id number] Will not follow formally at this time.  Will support as needed.

## 2015-12-11 DIAGNOSIS — I619 Nontraumatic intracerebral hemorrhage, unspecified: Secondary | ICD-10-CM

## 2015-12-11 DIAGNOSIS — Z7189 Other specified counseling: Secondary | ICD-10-CM

## 2015-12-11 DIAGNOSIS — Z515 Encounter for palliative care: Secondary | ICD-10-CM

## 2015-12-11 LAB — CULTURE, BLOOD (ROUTINE X 2)
CULTURE: NO GROWTH
Culture: NO GROWTH

## 2015-12-11 NOTE — Progress Notes (Signed)
Daily Progress Note   Patient Name: [name]       Date: 12/11/2015 DOB: [birth year]  Age: 75 y.o. MRN#: [id number] Attending Physician: Robbie Lis, MD Primary Care Physician: No primary care provider on file. Admit Date: 12/06/2015  Reason for Consultation/Follow-up: Disposition, Establishing goals of care, Non pain symptom management, Pain control and Psychosocial/spiritual support  Subjective: Patient non-responsive this AM.  Walden Field at bedside.  Reviewed conversation from last evening with him (he was not present) and discussed care plan moving forward for comfort.  Main concern from family is trying to get her other children from [place] here to visit.  I told him that I sent letter to [facility] this AM stating that she may transition to hospice.  Length of Stay: 5  Current Medications: Scheduled Meds:  . sodium chloride flush  3 mL Intravenous Q12H    Continuous Infusions:    PRN Meds: acetaminophen, bisacodyl, morphine injection, ondansetron **OR** ondansetron (ZOFRAN) IV  Physical Exam     General: Does not arouse, in no acute distress.  HEENT: No bruits, no goiter, no JVD Heart: Regular rate and rhythm. No murmur appreciated. Lungs: Diminished with scattered rhonchi Abdomen: Soft, nontender, nondistended, positive bowel sounds.  Ext: No significant edema Skin: Warm and dry      Vital Signs: BP (!) 158/46 (BP Location: Right Arm)   Pulse 91   Temp (!) 102.4 F (39.1 C) (Axillary)   Resp 17   Ht 5' (1.524 m) Comment: Per family  Wt 47.2 kg (104 lb 0.9 oz)   SpO2 100%   BMI 20.32 kg/m  SpO2: SpO2: 100 % O2 Device: O2 Device: Nasal Cannula O2 Flow Rate: O2 Flow Rate (L/min): 1 L/min  Intake/output summary:   Intake/Output Summary (Last 24 hours) at  12/11/15 1221 Last data filed at 12/10/15 2200  Gross per 24 hour  Intake                3 ml  Output                0 ml  Net                3 ml   LBM: Last BM Date: 12/09/15 Baseline Weight: Weight: 53 kg (116 lb 13.5 oz) Most recent weight: Weight:  47.2 kg (104 lb 0.9 oz)       Palliative Assessment/Data: 10%   Flowsheet Rows   Flowsheet Row Most Recent Value  Intake Tab  Referral Department  Hospitalist  Unit at Time of Referral  Med/Surg Unit  Palliative Care Primary Diagnosis  Neurology  Date Notified  12/07/15  Palliative Care Type  New Palliative care  Date of Admission  12/06/15  Date first seen by Palliative Care  12/07/15  # of days Palliative referral response time  0 Day(s)  # of days IP prior to Palliative referral  1  Clinical Assessment  Palliative Performance Scale Score  10%  Pain Max last 24 hours  Not able to report  Pain Min Last 24 hours  Not able to report  Psychosocial & Spiritual Assessment  Palliative Care Outcomes  Patient/Family meeting held?  Yes  Who was at the meeting?  3 sons, Grandson  Palliative Care Outcomes  Clarified goals of care  Patient/Family wishes: Interventions discontinued/not started   Mechanical Ventilation      Patient Active Problem List   Diagnosis Date Noted  . Palliative care encounter   . Terminal care   . Goals of care, counseling/discussion   . Acute encephalopathy   . Intracranial hemorrhage ([place])   . Fever 12/06/2015  . Sepsis ([place]) 12/06/2015  . Abdominal pain 12/06/2015  . Confusion 12/06/2015  . NHL (non-Hodgkin's lymphoma) ([place]) 12/06/2015  . Hypertension 12/06/2015  . Hyperlipidemia 12/06/2015  . CVA (cerebral vascular accident) ([place]) 12/06/2015  . DM (diabetes mellitus) ([place]) 12/06/2015  . Cirrhosis ([place]) 12/06/2015  . History of bladder cancer 12/06/2015  . Atrial fibrillation ([place]) 12/06/2015  . 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 Diabetes mellitus with complication ([place])    . ESRD on dialysis [facility])     Palliative Care Assessment & Plan   Patient Profile: 75 y.o. female  with past medical history of cirrhosis, ESLD, and ESRD on dialysis admitted on 12/06/2015 with bilateral parenchymal hemorrhage, sepsis, concern for spontaneous bacterial peritonitis, end-stage renal disease (on dialysis), ascites, and cirrhosis.  She was recently discharged on 11/23/2015 after admission for a CVA.  Palliative consulted for goals of care.   Recommendations/Plan:  Pain/SOB: Family has been reluctant to give morphine. On my exam, she is comfortable.  Discussed purpose of medication as well as indications with family present at bedside today.  Family visiting from [place]: Concern if she leaves the hospital that family will not get humanitarian visit approved.  I sent letter to [facility] this AM stating that she may transfer to residential hospice.  Discharge: family not comfortable with plan to d/c home.  Request residential hospice at [facility].  SW consult placed.  Goals of Care and Additional Recommendations:  Limitations on Scope of Treatment: Comfort care.  Continue abx while inpatient.  Code Status:    Code Status Orders        Start     Ordered   12/06/15 1945  Do not attempt resuscitation (DNR)  Continuous    Question Answer Comment  In the event of cardiac or respiratory ARREST Do not call a "code blue"   In the event of cardiac or respiratory ARREST Do not perform Intubation, CPR, defibrillation or ACLS   In the event of cardiac or respiratory ARREST Use medication by any route, position, wound care, and other measures to relive pain and suffering. May use oxygen, suction and manual treatment of airway obstruction as needed for comfort.  12/06/15 1944    Code Status History    Date Active Date Inactive Code Status Order ID Comments User Context   12/06/2015  3:42 PM 12/06/2015  7:44 PM Full Code [id number]  Rondel Jumbo, PA-C ED        Prognosis:   Hours - Days  Discharge Planning:  Hospice facility   Care plan was discussed with patient family, including son Cassandria Santee, bedside RN  Thank you for allowing the Palliative Medicine Team to assist in the care of this patient.   Time In: 0915 Time Out: 0945 Total Time 30 Prolonged Time Billed Yes      Greater than 50%  of this time was spent counseling and coordinating care related to the above assessment and plan.  Micheline Rough, MD  Please contact Palliative Medicine Team phone at [phone] for questions and concerns.

## 2015-12-11 NOTE — Progress Notes (Signed)
                                                                                                                                                                                                         Daily Progress Note   Patient Name: [name]       Date: 12/11/2015 DOB: 09/04/1940  Age: 75 y.o. MRN#: [id number] Attending Physician: Alma M Devine, MD Primary Care Physician: No primary care provider on file. Admit Date: 12/06/2015  Reason for Consultation/Follow-up: Disposition, Establishing goals of care, Non pain symptom management, Pain control and Psychosocial/spiritual support  Subjective: Met again with family this evening, including son, Antonio.  Extensive discussion as below.  Length of Stay: 5  Current Medications: Scheduled Meds:  . cefTRIAXone (ROCEPHIN)  IV  2 g Intravenous Q24H  . sodium chloride flush  3 mL Intravenous Q12H    Continuous Infusions:    PRN Meds: acetaminophen, bisacodyl, morphine injection, ondansetron **OR** ondansetron (ZOFRAN) IV  Physical Exam     General: Does not arouse, in no acute distress.  HEENT: No bruits, no goiter, no JVD Heart: Regular rate and rhythm. No murmur appreciated. Lungs: Diminished with scattered rhonchi Abdomen: Soft, nontender, nondistended, positive bowel sounds.  Ext: No significant edema Skin: Warm and dry      Vital Signs: BP (!) 132/42 (BP Location: Right Arm)   Pulse 88   Temp 99.6 F (37.6 C) (Axillary)   Resp 20   Ht 5' (1.524 m) Comment: Per family  Wt 47.2 kg (104 lb 0.9 oz)   SpO2 100%   BMI 20.32 kg/m  SpO2: SpO2: 100 % O2 Device: O2 Device: Nasal Cannula O2 Flow Rate: O2 Flow Rate (L/min): 1 L/min  Intake/output summary:  Intake/Output Summary (Last 24 hours) at 12/11/15 0745 Last data filed at 12/10/15 2200  Gross per 24 hour  Intake                3 ml  Output                0 ml  Net                3 ml   LBM: Last BM Date: 12/09/15 Baseline Weight: Weight: 53 kg (116 lb  13.5 oz) Most recent weight: Weight: 47.2 kg (104 lb 0.9 oz)       Palliative Assessment/Data: 10%   Flowsheet Rows   Flowsheet Row Most Recent Value  Intake Tab  Referral Department  Hospitalist  Unit at Time of Referral  Med/Surg   Unit  Palliative Care Primary Diagnosis  Neurology  Date Notified  12/07/15  Palliative Care Type  New Palliative care  Date of Admission  12/06/15  Date first seen by Palliative Care  12/07/15  # of days Palliative referral response time  0 Day(s)  # of days IP prior to Palliative referral  1  Clinical Assessment  Palliative Performance Scale Score  10%  Pain Max last 24 hours  Not able to report  Pain Min Last 24 hours  Not able to report  Psychosocial & Spiritual Assessment  Palliative Care Outcomes  Patient/Family meeting held?  Yes  Who was at the meeting?  3 sons, Grandson  Palliative Care Outcomes  Clarified goals of care  Patient/Family wishes: Interventions discontinued/not started   Mechanical Ventilation      Patient Active Problem List   Diagnosis Date Noted  . Acute encephalopathy   . Intracranial hemorrhage ([place])   . Fever 12/06/2015  . Sepsis ([place]) 12/06/2015  . Abdominal pain 12/06/2015  . Confusion 12/06/2015  . NHL (non-Hodgkin's lymphoma) ([place]) 12/06/2015  . Hypertension 12/06/2015  . Hyperlipidemia 12/06/2015  . CVA (cerebral vascular accident) ([place]) 12/06/2015  . DM (diabetes mellitus) ([place]) 12/06/2015  . Cirrhosis ([place]) 12/06/2015  . History of bladder cancer 12/06/2015  . Atrial fibrillation ([place]) 12/06/2015  . 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) 12/06/2015  . Diabetes mellitus with complication ([place])   . ESRD on dialysis [facility])     Palliative Care Assessment & Plan   Patient Profile: 75 y.o. female  with past medical history of cirrhosis, ESLD, and ESRD on dialysis admitted on 12/06/2015 with bilateral parenchymal hemorrhage, sepsis, concern for spontaneous bacterial peritonitis, end-stage renal disease  (on dialysis), ascites, and cirrhosis.  She was recently discharged on 11/23/2015 after admission for a CVA.  Palliative consulted for goals of care.    Recommendations/Plan:  Extensive conversation with family regarding the fact that she continues to decline.  They have several concerns that we discussed in detail:  Nutrition- Discussed nutrition and hydration at length with family.  They seem more understanding of the fact that continued hydration at the end of life is likely to worsen, rather than improve comfort.  Comfort feeds as tolerated.  Pain/SOB: Family has been reluctant to give morphine. On my exam, she is comfortable.  Discussed purpose of medication as well as indications.  Family visiting from [place]: Concern if she leaves the hospital that family will not get humanitarian visit approved.  I will write another letter stating if she leaves hospital it will be on comfort care with hospice.  Discharge: family not comfortable with plan to d/c home.  Request residential hospice at [facility].  Goals of Care and Additional Recommendations:  Limitations on Scope of Treatment: Comfort care.  Continue abx while inpatient.  Code Status:    Code Status Orders        Start     Ordered   12/06/15 1945  Do not attempt resuscitation (DNR)  Continuous    Question Answer Comment  In the event of cardiac or respiratory ARREST Do not call a "code blue"   In the event of cardiac or respiratory ARREST Do not perform Intubation, CPR, defibrillation or ACLS   In the event of cardiac or respiratory ARREST Use medication by any route, position, wound care, and other measures to relive pain and suffering. May use oxygen, suction and manual treatment of airway obstruction as needed for comfort.  12/06/15 1944    Code Status History    Date Active Date Inactive Code Status Order ID Comments User Context   12/06/2015  3:42 PM 12/06/2015  7:44 PM Full Code [id number]  Sara E Wertman, PA-C  ED       Prognosis:   Hours - Days  Discharge Planning:  Hospice facility most likely  Care plan was discussed with patient family, including antonio, bedside RN  Thank you for allowing the Palliative Medicine Team to assist in the care of this patient.   Time In: 1830 Time Out: 1940 Total Time 70 Prolonged Time Billed Yes      Greater than 50%  of this time was spent counseling and coordinating care related to the above assessment and plan.  Gene Freeman, MD  Please contact Palliative Medicine Team phone at [phone] for questions and concerns.      

## 2015-12-11 NOTE — Care Management Important Message (Signed)
Important Message  Patient Details  Name: [name] MRN: [id number] Date of Birth: 1940-03-24   Medicare Important Message Given:  Yes    Orbie Pyo 12/11/2015, 5:27 PM

## 2015-12-11 NOTE — Progress Notes (Signed)
Patient resting comfortably, no difficulty breathing or facial grimacing, muscles relaxed. 2 family members at bedside. Rn will continue to monitor.

## 2015-12-11 NOTE — Clinical Social Work Note (Signed)
Clinical Social Work Assessment  Patient Details  Name: [name] MRN: [id number] Date of Birth: [birth year]  Date of referral:  12/11/15               Reason for consult:  Facility Placement, Grief and Loss, Discharge Planning, End of Life/Hospice                Permission sought to share information with:  Family Supports Permission granted to share information::  Yes, Release of Information Signed  Name::     Brandie Lopes  Relationship::  son  Contact Information:  [phone]  Housing/Transportation Living arrangements for the past 2 months:  [place] of Information:  Adult Children Patient Interpreter Needed:  None Criminal Activity/Legal Involvement Pertinent to Current Situation/Hospitalization:  No - Comment as needed Significant Relationships:  Adult Children Lives with:  Adult Children Do you feel safe going back to the place where you live?  No Need for family participation in patient care:  No (Coment)  Care giving concerns:  No care giving concerns   Facilities manager / plan:  CSW met with pt's daughter in law with a [place] speaking interpreter. CSW introduced herself and explained role of social work. CSW also explained the process of discharging to a residential hospice. The recommendation is residential hospice. CSW provided choice. Pt's daughter in law chose [facility] as all the family lives in [place] and is most convenient. Pt's daughter in law will follow up with pt's sons regarding a final choice. However, pt's daughter in law is agreeable to CSW making a referral to [facility]. CSW spoke with hospital liaison, who will send clinicals for review. CSW awaiting update. CSW will continue to follow.   Employment status:  Retired Forensic scientist:  Medicare PT Recommendations:  No Follow Up Information / Referral to community resources:  Other (Comment Required) (Hospice)  Patient/Family's Response to care:  Pt's  daughter in law was appreciative of CSW support  Patient/Family's Understanding of and Emotional Response to Diagnosis, Current Treatment, and Prognosis:  Pt's daughter in law understands that pt's care is focusing on comfort.   Emotional Assessment Appearance:  Appears older than stated age Attitude/Demeanor/Rapport:  Unresponsive Affect (typically observed):    Flat Orientation:  Oriented to Self, Fluctuating Orientation (Suspected and/or reported Sundowners) Alcohol / Substance use:  Never Used Psych involvement (Current and /or in the community):  No (Comment)  Discharge Needs  Concerns to be addressed:  Grief and Loss Concerns Readmission within the last 30 days:  No Current discharge risk:  Terminally ill Barriers to Discharge:  Continued Medical Work up   [employer], LCSW 12/11/2015, 12:14 PM

## 2015-12-11 NOTE — Progress Notes (Signed)
Patient ID: [name], female   DOB: Jun 16, 1940, 75 y.o.   MRN: [id number]  PROGRESS NOTE    [name]  [id number] DOB: [birth year] DOA: 12/06/2015  PCP: No primary care provider on file.   Brief Narrative:  75 year old Hispanic female with end-stage renal disease on dialysis,( Tu,Th,Sat), recent hospitalization for CVA (discharged on 11/23/2015 on aspirin), diabetes mellitus, A. fib, hypertension brought to the ED with acute encephalopathy. On presentation she was septic with fever of 102.5 F, tachypneic. Blood work showed creatinine of 2.9, glucose of 308 and lactic acid of 3. Abdominal x-ray showed nonobstructive bowel gas pattern, chest x-ray negative for infiltrate. CT abdomen showed hepatic cirrhosis with large amount of ascites. CT head showed parenchymal hemorrhage in the temporalis occipital region bilaterally with possible subarachnoid extension along the inferior accident of the right occipital lobe. Neurosurgeon consulted and recommended she was not a good surgical candidate given her severe comorbidities. Palliative care consulted for goals of care discussion given her overall poor prognosis. Pt at this time full comfort care per family wishes.    Assessment & Plan:  Sepsis, unspecified organism ([place]) - No clear source. Suspect SBP. Elevated lactic acid and procalcitonin on admission.  - On empiric vancomycin and Zosyn - stopped as of now as pt on full comfort care - Blood cultures and ascites fluid culture negative.   Active Problems: Acute hepatic encephalopathy/bilateral intracerebral hemorrhage with hemorrhagic infarctions. - Patient had ascites with elevated ammonia level (70) on admission.  - Per neurosurgery, not a candidate for surgery - She remains encephalopathic with overall poor prognosis.  Liver cirrhosis - As noted on CT abdomen. No prior history. Was on empiric antibiotics but currently all antibiotics stopped as focuses on full comfort care    Essential hypertension  - No further medications, focus on full comfort care   Elevated troponin  - EKG unremarkable. Peaked at 2.17. Patient unable to communicate.  End-stage renal disease on dialysis - Renal following. Family declined continuing dialysis.  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 Arnoldo Morale)  Palliative care  Procedures:   Paracentesis 12/07/2015 with 5.5 L fluid removed  Antimicrobials:   Patient was on vancomycin and Zosyn, all antibiotics stopped at this point   Subjective: No overnight events.  Objective: Vitals:   12/10/15 2150 12/11/15 0100 12/11/15 0527 12/11/15 1031  BP:  (!) 118/45 (!) 132/42 (!) 158/46  Pulse: 90 82 88 91  Resp: 16 18 20 17   Temp:  98.5 F (36.9 C) 99.6 F (37.6 C) (!) 102.4 F (39.1 C)  TempSrc:  Axillary Axillary Axillary  SpO2:  100% 100% 100%  Weight:      Height:        Intake/Output Summary (Last 24 hours) at 12/11/15 1143 Last data filed at 12/10/15 2200  Gross per 24 hour  Intake                3 ml  Output                0 ml  Net                3 ml   Filed Weights   12/06/15 1544 12/08/15 1315 12/08/15 1641  Weight: 53 kg (116 lb 13.5 oz) 47.2 kg (104 lb 0.9 oz) 47.2 kg (104 lb 0.9 oz)    Examination:  General exam: Appears calm and comfortable  Respiratory system: Clear to auscultation. Respiratory effort normal. Cardiovascular system: S1 & S2 heard, Rate controlled  Gastrointestinal system: Abdomen is non tender, appreciate bowel sounds  Central  nervous system: Not responding to verbal or painful stimuli Extremities: Symmetric 5 x 5 power. Skin: No rashes, lesions or ulcers Psychiatry: Unable to assess secondary to patient's mental status, she is not responding to verbal or painful stimuli  Data Reviewed: I have personally reviewed following labs and imaging studies  CBC:  Recent Labs Lab 12/06/15 0752 12/06/15 0816 12/07/15 0416 12/08/15 1433  WBC 9.7  --  9.9 8.5  NEUTROABS 7.7  --   --   --   HGB 12.6 13.9 12.1 11.3*  HCT 38.4 41.0 36.7 34.2*  MCV 99.5  --  98.7 99.1  PLT 259  --  215 0000000   Basic Metabolic Panel:  Recent Labs Lab 12/06/15 0752 12/06/15 0816 12/07/15 0416 12/08/15 1448 12/09/15 0454  NA 135 134* 138 142 140  K 3.9 3.8 3.7 4.7 4.1  CL 94* 95* 97* 99* 97*  CO2 26  --  27 25 29   GLUCOSE 308* 303* 150* 139* 126*  BUN 16 22* 27* 55* 22*  CREATININE 3.92* 3.90* 5.51* 7.73* 3.72*  CALCIUM 9.2  --  9.8 10.3 10.8*  PHOS  --   --   --  6.8*  --    GFR: Estimated Creatinine Clearance: 9.7 mL/min (by C-G formula based on SCr of 3.72 mg/dL (H)). Liver Function Tests:  Recent Labs Lab 12/06/15 0752 12/07/15 0416 12/08/15 1448  AST 40 32  --   ALT 12* 10*  --   ALKPHOS 86 70  --   BILITOT 0.8 0.9  --   PROT 7.3 6.8  --   ALBUMIN 3.4* 2.9* 2.3*    Recent Labs Lab 12/06/15 0752  LIPASE 23    Recent Labs Lab 12/06/15 1605  AMMONIA 70*   Coagulation Profile:  Recent Labs Lab 12/06/15 0752 12/06/15 1532 12/07/15 0416  INR 1.39 1.56 2.01   Cardiac Enzymes:  Recent Labs Lab 12/06/15 1948 12/06/15 2230 12/07/15 0416  TROPONINI 0.24* 1.01* 2.17*   BNP (last 3 results) No results for input(s): PROBNP in the last 8760 hours. HbA1C: No results for input(s): HGBA1C in the last 72 hours. CBG:  Recent Labs Lab 12/08/15 0615 12/08/15 1152 12/08/15 1815 12/08/15 2134 12/09/15 0651  GLUCAP 140* 151* 114* 108* 131*   Lipid Profile: No results for input(s): CHOL, HDL,  LDLCALC, TRIG, CHOLHDL, LDLDIRECT in the last 72 hours. Thyroid Function Tests: No results for input(s): TSH, T4TOTAL, FREET4, T3FREE, THYROIDAB in the last 72 hours. Anemia Panel: No results for input(s): VITAMINB12, FOLATE, FERRITIN, TIBC, IRON, RETICCTPCT in the last 72 hours. Urine analysis: No results found for: COLORURINE, APPEARANCEUR, LABSPEC, [place], GLUCOSEU, HGBUR, BILIRUBINUR, KETONESUR, PROTEINUR, UROBILINOGEN, NITRITE, LEUKOCYTESUR Sepsis Labs: @LABRCNTIP (procalcitonin:4,lacticidven:4)   Recent Results (from the past 240 hour(s))  Culture, blood (routine x 2)     Status: None (Preliminary result)   Collection Time: 12/06/15 12:58 PM  Result Value Ref Range Status   Specimen Description BLOOD RIGHT WRIST  Final  Special Requests IN PEDIATRIC BOTTLE  2CC  Final   Culture NO GROWTH 4 DAYS  Final   Report Status PENDING  Incomplete  Culture, blood (routine x 2)     Status: None (Preliminary result)   Collection Time: 12/06/15  1:05 PM  Result Value Ref Range Status   Specimen Description BLOOD RIGHT HAND  Final   Special Requests IN PEDIATRIC BOTTLE 2CC  Final   Culture NO GROWTH 4 DAYS  Final   Report Status PENDING  Incomplete  Culture, body fluid-bottle     Status: None (Preliminary result)   Collection Time: 12/07/15  2:02 PM  Result Value Ref Range Status   Specimen Description FLUID PERITONEAL  Final   Special Requests NONE  Final   Culture NO GROWTH 3 DAYS  Final   Report Status PENDING  Incomplete  Gram stain     Status: None   Collection Time: 12/07/15  2:02 PM  Result Value Ref Range Status   Specimen Description FLUID PERITONEAL  Final   Special Requests NONE  Final   Gram Stain   Final    MODERATE WBC PRESENT, PREDOMINANTLY PMN NO ORGANISMS SEEN    Report Status 12/07/2015 FINAL  Final      Radiology Studies: X-ray Chest Pa And Lateral Result Date: 12/07/2015 Mild interstitial edema.  No focal pneumonia. Electronically Signed   By: Abelardo Diesel M.D.   On: 12/07/2015 18:04   US Paracentesis Result Date: 12/07/2015 Successful ultrasound-guided paracentesis yielding 5.45 liters of peritoneal fluid.      Scheduled Meds: . sodium chloride flush  3 mL Intravenous Q12H   Continuous Infusions:    LOS: 5 days    Time spent: 258 minutes  Greater than 50% of the time spent on counseling and coordinating the care.   Leisa Lenz, MD [facility] Pager [phone]  If 7PM-7AM, please contact night-coverage [email or web address] Password [email or web address] 12/11/2015, 11:43 AM

## 2015-12-12 ENCOUNTER — Telehealth: Payer: Self-pay

## 2015-12-12 LAB — CULTURE, BODY FLUID W GRAM STAIN -BOTTLE

## 2015-12-12 LAB — CULTURE, BODY FLUID-BOTTLE: CULTURE: NO GROWTH

## 2015-12-12 MED ORDER — LORAZEPAM 2 MG/ML IJ SOLN: 0.5000 mg | INTRAMUSCULAR | Status: DC | PRN

## 2015-12-12 MED ORDER — LORAZEPAM 2 MG/ML IJ SOLN: 1.0000 mg | INTRAMUSCULAR | Status: DC | PRN

## 2015-12-12 MED ORDER — LORAZEPAM 2 MG/ML IJ SOLN: 0.5000 mg | INTRAMUSCULAR | 0 refills | Status: AC | PRN

## 2015-12-12 MED ORDER — ACETAMINOPHEN 650 MG RE SUPP: 650.0000 mg | RECTAL | 0 refills | Status: AC | PRN

## 2015-12-12 MED ORDER — FENTANYL CITRATE (PF) 100 MCG/2ML IJ SOLN: 12.5000 ug | INTRAMUSCULAR | Status: DC | PRN

## 2015-12-12 MED ORDER — FENTANYL CITRATE (PF) 100 MCG/2ML IJ SOLN: 12.5000 ug | INTRAMUSCULAR | 0 refills | Status: AC | PRN

## 2015-12-13 ENCOUNTER — Encounter (HOSPITAL_COMMUNITY): Payer: Self-pay

## 2015-12-13 NOTE — Clinical Social Work Note (Signed)
Pt is ready for discharge to [facility]. Pt's family is in agreement with discharge plan. Paperwork has been completed. RN called report and PTAR to provide transportation. CSW is signing off as no further needs identified.   Darden Dates, MSW, LCSW  Clinical Social Worker  [phone]

## 2015-12-13 NOTE — Care Management Note (Signed)
Case Management Note  Patient Details  Name: AMBREE HERSON MRN: [id number] Date of Birth: 10-Jan-1941  Subjective/Objective:                    Action/Plan: Patient discharging to [facility] today. No further needs per CM.   Expected Discharge Date:                  Expected Discharge Plan:  [place]  In-House Referral:  Clinical Social Work  Discharge planning Services  CM Consult  Post Acute Care Choice:    Choice offered to:     DME Arranged:    DME Agency:     HH Arranged:    [place] Agency:     Status of Service:  Completed, signed off  If discussed at [employer], dates discussed:    Additional Comments:  Pollie Friar, RN 2016-01-06, 12:08 PM

## 2015-12-13 NOTE — Progress Notes (Signed)
Pt has had a temp of 103 plus for most of the day I gave a tylenol suppository around 2140, she received 2 today and they did not reduce her temp. Will continue to monitor.

## 2015-12-13 NOTE — Progress Notes (Addendum)
Daily Progress Note   Patient Name: [name]       Date: 01-09-16 DOB: [birth year]  Age: 75 y.o. MRN#: [id number] Attending Physician: Robbie Lis, MD Primary Care Physician: No primary care provider on file. Admit Date: 12/06/2015  Reason for Consultation/Follow-up: Disposition, Establishing goals of care, Non pain symptom management, Pain control and Psychosocial/spiritual support  Subjective: [name] remains non-responsive. She continues to have high fevers, uncontrolled despite tylenol. I suspect these are end of life fevers and are unlikely to be reduced with medication. Her bodily presentation suggest continued progression to end of life: rapid shallow breaths and cooling of extremities. Her son, Myna Bright was at the bedside, in addition to a daughter and granddaughter. While tearful, they seemed comfortable and had no questions. They continue to wait for word from the [facility], as they are hopeful emergency visas will be granted to family in [place] who wish to be here to say goodbye.   Length of Stay: 6  Current Medications: Scheduled Meds:  . sodium chloride flush  3 mL Intravenous Q12H    Continuous Infusions:    PRN Meds: acetaminophen, bisacodyl, morphine injection, ondansetron **OR** ondansetron (ZOFRAN) IV  Physical Exam  Constitutional: She appears cachectic. She has a sickly appearance.  No behavioral or visual signs of discomfort  Cardiovascular: Tachycardia present.   Pulmonary/Chest:  Rapid shallow breaths  Neurological: She is unresponsive.  Skin:  Central body hot to touch (fever), however feet and hands cooler       Vital Signs: BP (!) 120/40 (BP Location: Right Arm)   Pulse 84   Temp (!) 103.3 F (39.6 C) (Axillary)   Resp (!) 22   Ht 5'  (1.524 m) Comment: Per family  Wt 47.2 kg (104 lb 0.9 oz)   SpO2 96%   BMI 20.32 kg/m  SpO2: SpO2: 96 % O2 Device: O2 Device: Nasal Cannula O2 Flow Rate: O2 Flow Rate (L/min): 1 L/min  LBM: Last BM Date: (P) 12/09/15 Baseline Weight: Weight: 53 kg (116 lb 13.5 oz) Most recent weight: Weight: 47.2 kg (104 lb 0.9 oz)       Palliative Assessment/Data: 10%   Flowsheet Rows   Flowsheet Row Most Recent Value  Intake Tab  Referral Department  Hospitalist  Unit at Time of Referral  Med/Surg Unit  Palliative  Care Primary Diagnosis  Neurology  Date Notified  12/07/15  Palliative Care Type  New Palliative care  Date of Admission  12/06/15  Date first seen by Palliative Care  12/07/15  # of days Palliative referral response time  0 Day(s)  # of days IP prior to Palliative referral  1  Clinical Assessment  Palliative Performance Scale Score  10%  Pain Max last 24 hours  Not able to report  Pain Min Last 24 hours  Not able to report  Psychosocial & Spiritual Assessment  Palliative Care Outcomes  Patient/Family meeting held?  Yes  Who was at the meeting?  3 sons, Grandson  Palliative Care Outcomes  Clarified goals of care  Patient/Family wishes: Interventions discontinued/not started   Mechanical Ventilation      Patient Active Problem List   Diagnosis Date Noted  . Palliative care encounter   . Terminal care   . Goals of care, counseling/discussion   . Stroke, hemorrhagic ([place])   . Acute encephalopathy   . Intracranial hemorrhage ([place])   . Fever 12/06/2015  . Sepsis ([place]) 12/06/2015  . Abdominal pain 12/06/2015  . Confusion 12/06/2015  . NHL (non-Hodgkin's lymphoma) ([place]) 12/06/2015  . Hypertension 12/06/2015  . Hyperlipidemia 12/06/2015  . CVA (cerebral vascular accident) ([place]) 12/06/2015  . DM (diabetes mellitus) ([place]) 12/06/2015  . Cirrhosis ([place]) 12/06/2015  . History of bladder cancer 12/06/2015  . Atrial fibrillation ([place]) 12/06/2015  . 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 Diabetes mellitus with complication ([place])   . ESRD on dialysis [facility])     Palliative Care Assessment & Plan   Patient Profile: 75 y.o. female  with past medical history of cirrhosis, ESLD, and ESRD on dialysis admitted on 12/06/2015 with bilateral parenchymal hemorrhage, sepsis, concern for spontaneous bacterial peritonitis, end-stage renal disease (on dialysis), ascites, and cirrhosis.  She was recently discharged on 11/23/2015 after admission for a CVA.  Palliative consulted for goals of care.   After extensive discussion with the primary team and palliative care, the family elected to pursue comfort measures only, and pursue Hospice.  Recommendations/Plan: -Hospice evaluated 10/30, and have a bed available today. I believe she is stable enough for transport there (ideally sooner rather than later), and her family would surely benefit from the support Hospice can provide -Continue to wait for word from the [place] regarding humanitarian visit allowance for family from [place]; letter of urgency sent 10/30 (copy in paper chart) -Symptom support  -Pain/SOB: Will stop Morphine given risk for toxic metabolites in setting of ESRD, start IV Fentanyl PRN  -Anxiety/Seizures: Start IV Ativan PRN  -Bowels: Dulcolax PR PRN  Goals of Care and Additional Recommendations:  Limitations on Scope of Treatment: Comfort care.    Code Status:    Code Status Orders        Start     Ordered   12/06/15 1945  Do not attempt resuscitation (DNR)  Continuous    Question Answer Comment  In the event of cardiac or respiratory ARREST Do not call a "code blue"   In the event of cardiac or respiratory ARREST Do not perform Intubation, CPR, defibrillation or ACLS   In the event of cardiac or respiratory ARREST Use medication by any route, position, wound care, and other measures to relive pain and suffering. May use oxygen, suction and manual treatment of airway  obstruction as needed for comfort.      12/06/15 1944    Code Status History  Date Active Date Inactive Code Status Order ID Comments User Context   12/06/2015  3:42 PM 12/06/2015  7:44 PM Full Code [id number]  Rondel Jumbo, PA-C ED       Prognosis:   Hours - Days  Discharge Planning:  Hospice facility   Care plan was discussed with patient family, including son [place], Physiological scientist, hospice liaison [place].   Thank you for allowing the Palliative Medicine Team to assist in the care of this patient.   Time In: 0855 Time Out: 0920 Total Time 25 Prolonged Time Billed No      Greater than 50%  of this time was spent counseling and coordinating care related to the above assessment and plan.  Jannette Fogo, NP  Please contact Palliative Medicine Team phone at [phone] for questions and concerns.   Addendum: Patient seen and examined Family supported PRN medications adjusted: added IV PRN Fentanyl and Ativan. D/C Morphine.  Agree with transfer to residential hospice if possible today  Loistine Chance MD [phone]

## 2015-12-13 NOTE — Telephone Encounter (Signed)
Received Plan of Care from [facility] for DOS: 11/27/2015 through 01/25/2016. Per PCP unable to sign since she has not been seen in 2 years. Forms faxed back to [facility] and sent for scanning. (Pt currently admitted, see chart [id number], duplicate chart). IT informed for merge.

## 2015-12-13 NOTE — Discharge Instructions (Signed)
Cuidados paliativos (Hospice) Los cuidados paliativos son un servicio diseado para brindar apoyo mdico, espiritual y psicolgico a los enfermos terminales y a sus familias. El objetivo es mejorar su calidad de vida y, para ello, lo mantienen tan consciente y cmodo como es posible. Los cuidados paliativos estn a cargo de un equipo de profesionales mdicos y voluntarios que:  Ayudan a mantenerlo cmodo. Los cuidados paliativos pueden brindarse en su casa o en un entorno acogedor. Los miembros del personal de cuidados paliativos trabajan con su familia y sus amigos para ayudar a satisfacer sus necesidades. Usted disfrutar del apoyo de los seres queridos al recibir gran parte de la atencin bsica de la familia y los amigos.  Brindan alivio para el dolor y controlan los sntomas. Los miembros del personal administran todos los medicamentos necesarios y proporcionan el equipo.  Le ofrecen compaa cuando est solo.  Permiten que usted y los miembros de su familia descansen. Pueden realizar los quehaceres domsticos, preparar las comidas y ocuparse de las obligaciones diarias.  Brindan asesoramiento. Se asegurarn de que se satisfagan sus necesidades emocionales, espirituales y sociales, as como las de su familia.  Ofrecen asistencia espiritual. La asistencia espiritual se personaliza para satisfacer sus necesidades y las de su familia. Esta puede incluir brindarle ayuda para analizar lo que la muerte significa para usted, despedirse o llevar a cabo una ceremonia o un ritual religioso especfico. Generalmente, los equipos de cuidados paliativos incluyen:  Un enfermero.  Un mdico.  Trabajadores sociales.  Lderes religiosos (como un capelln).  Voluntarios capacitados. CUNDO DEBEN COMENZAR LOS CUIDADOS PALIATIVOS? Se cree que la mayora de las personas que utilizan los cuidados paliativos tienen menos de 6meses de vida. Su familia y los mdicos pueden ayudarlo a decidir cundo deben  comenzar los servicios de cuidados paliativos. Si su cuadro clnico mejora, puede suspender el programa. QU COSAS DEBO TENER EN CUENTA ANTES DE ELEGIR UN PROGRAMA? La mayora de los programas de cuidados paliativos estn a cargo de organizaciones independientes sin fines de lucro. Algunas estn afiliadas a hospitales, hogares de ancianos o agencias de atencin mdica en el hogar. Los programas de cuidados paliativos pueden implementarse en el hogar o en un centro de cuidados paliativos, un hospital o un centro especializado de enfermera. Cuando elija un programa de cuidados paliativos, haga las siguientes preguntas:  Qu servicios tengo a mi disposicin?  Qu servicios se ofrecen a mis seres queridos?  Qu participacin tienen mis seres queridos?  Qu participacin tiene mi mdico?  Quines integran el equipo de cuidados paliativos? Cmo se los capacita o selecciona?  Cmo se controlarn el dolor y los sntomas?  Si mi situacin cambia, los servicios pueden brindarse en un entorno diferente, por ejemplo, en mi casa o en el hospital?  El estado, revisa y autoriza el programa, o este tiene algn otro tipo de certificacin? DNDE PUEDO OBTENER MS INFORMACIN ACERCA DE LOS CUIDADOS PALIATIVOS? Los mdicos pueden brindarle informacin acerca de los programas de cuidados paliativos que existen en su zona. Adems, puede leer al respecto en Internet. Los sitios web de las siguientes organizaciones incluyen informacin til:  Organizacin Nacional de Hospicios y Cuidado Paliativo (NHPCO).  Asociacin de Cuidados Paliativos de Estados Unidos (HAA).  Instituto de Educacin sobre Cuidados Paliativos.  Sociedad Estadounidense del Cncer (ACS).  Hospice Net.   Esta informacin no tiene como fin reemplazar el consejo del mdico. Asegrese de hacerle al mdico cualquier pregunta que tenga.   Document Released: 05/16/2008 Document Revised: 02/02/2013 Elsevier Interactive Patient    Education 2016 Elsevier Inc.  

## 2015-12-13 NOTE — Progress Notes (Signed)
Patient is discharged from room 5M06 at this time. Unresponsive. Report given to receiving nurse Pervis Hocking, RN at [facility] with all questions answered. Left unit via stretcher by PTAR with all belongings and family at side.

## 2015-12-13 NOTE — Discharge Summary (Signed)
Physician Discharge Summary  [name] [id number] DOB: 1940/09/09 DOA: 12/06/2015  PCP: No primary care provider on file.  Admit date: 12/06/2015 Discharge date: 12/15/15  Recommendations for Outpatient Follow-up:  1. Comfort care per family wishes.   Discharge Diagnoses:  Principal Problem:   Sepsis ([place]) Active Problems:   Fever   Abdominal pain   Confusion   NHL (non-Hodgkin's lymphoma) (HCC)   Hypertension   Hyperlipidemia   CVA (cerebral vascular accident) ([place])   DM (diabetes mellitus) ([place])   Cirrhosis ([place])   History of bladder cancer   Atrial fibrillation ([place])   Stenosis of right subclavian artery (HCC)   Diabetes mellitus with complication ([place])   ESRD on dialysis ([place])   Acute encephalopathy   Intracranial hemorrhage ([place])   Palliative care encounter   Terminal care   Goals of care, counseling/discussion   Stroke, 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 fib, hypertension brought to the ED with acute encephalopathy. On presentation she was septic with fever of 102.5 F, tachypneic. Blood work showed creatinine of 2.9, glucose of 308 and lactic acid of 3. Abdominal x-ray showed nonobstructive bowel gas pattern, chest x-ray negative for infiltrate. CT abdomen showed hepatic cirrhosis with large amount of ascites. CT head showed parenchymal hemorrhage in the temporalis occipital region bilaterally with possible subarachnoid extension along the inferior accident of the right occipital lobe. Neurosurgeon consulted and recommended she was not a good surgical candidate given her severe comorbidities. Palliative care consulted for goals of care discussion given her overall poor prognosis. Pt at this time full  comfort care per family wishes.   Hospital Course:    Assessment & Plan:  Sepsis, unspecified organism ([place]) - No clear source. Suspect SBP. Elevated lactic acid and procalcitonin on admission.  - Was on empiric vancomycin and Zosyn  - Blood cultures and ascites fluid culture negative.   Active Problems: Acute hepatic encephalopathy/bilateral intracerebral hemorrhage with hemorrhagic infarctions. - Patient had ascites with elevated ammonia level (70) on admission.  - Per neurosurgery, not a candidate for surgery - She remains encephalopathicwith overall poor prognosis.  Liver cirrhosis - As noted on CT abdomen. No prior history. Was on empiric antibiotics but currently all antibiotics stopped as focuses on full comfort care   Essential hypertension - No further medications, focus on full comfort care   Elevated troponin  - EKG unremarkable. Peaked at 2.17. Patient unable to communicate.  End-stage renal disease on dialysis - Renal following. Family declined continuing dialysis.  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 Arnoldo Morale)  Palliative care  Procedures:   Paracentesis 12/07/2015 with 5.5 L fluid removed  Antimicrobials:   Patient was on vancomycin and Zosyn, all antibiotics stopped at this point     Signed:  Leisa Lenz, MD  [facility] 12/15/15, 11:27 AM  Pager #: [phone]  Time spent in minutes: less than 30 minutes  Discharge Exam: Vitals:   12-26-15 0628 12/26/2015 1029  BP: (!) 120/40 (!) 127/34  Pulse: 84 92  Resp: (!) 22  19  Temp: (!) 103.3 F (39.6 C) (!) 102.7 F (39.3 C)   Vitals:   12/11/15 2137 12/26/2015 0133 December 26, 2015 0628 12/26/2015 1029  BP: (!) 135/49 (!) 138/45 (!) 120/40 (!) 127/34  Pulse: 88 95 84 92  Resp: 20 (!) 22 (!) 22 19  Temp: (!) 103.3 F (39.6 C) (!) 103.3 F (39.6 C) (!) 103.3 F (39.6 C) (!) 102.7 F (39.3 C)  TempSrc: Axillary Axillary Axillary Axillary  SpO2: (!) 89% 96% 96% 98%  Weight:      Height:        General: Pt is not in acute distress Cardiovascular: Regular rate and rhythm, S1/S2 + Respiratory: Clear to auscultation bilaterally, no wheezing Abdominal: Soft, distended, bowel sounds + Extremities: no cyanosis, pulses palpable Neuro: Does not respond to verbal or painful stimuli  Discharge Instructions  Discharge Instructions    Call MD for:  severe uncontrolled pain    Complete by:  As directed    Diet - low sodium heart healthy    Complete by:  As directed    Increase activity slowly    Complete by:  As directed        Medication List    STOP taking these medications   aspirin 325 MG tablet   cinacalcet 60 MG tablet Commonly known as:  SENSIPAR   glipiZIDE 10 MG tablet Commonly known as:  GLUCOTROL   labetalol 200 MG tablet Commonly known as:  NORMODYNE   omeprazole 20 MG capsule Commonly known as:  PRILOSEC   sorbitol 70 % solution     TAKE these medications   acetaminophen 650 MG suppository Commonly known as:  TYLENOL Place 1 suppository (650 mg total) rectally every 4 (four) hours as needed for fever.   fentaNYL 100 MCG/2ML injection Commonly known as:  SUBLIMAZE Inject 0.25 mLs (12.5 mcg total) into the vein every 2 (two) hours as needed for moderate pain or severe pain (air hunger).   LORazepam 2 MG/ML injection Commonly known as:  ATIVAN Inject 0.25 mLs (0.5 mg total) into the vein every 4 (four) hours as needed for anxiety.         The results of significant diagnostics from this hospitalization (including imaging,  microbiology, ancillary and laboratory) are listed below for reference.    Significant Diagnostic Studies: X-ray Chest Pa And Lateral  Result Date: 12/07/2015 CLINICAL DATA:  Sepsis EXAM: CHEST  2 VIEW COMPARISON:  December 06, 2015 FINDINGS: The heart size and mediastinal contours are stable. The heart size is enlarged. There is mild increased pulmonary interstitium bilaterally. There is no focal pneumonia or pleural effusion. There is minimal atelectasis of the lateral left lung base. The visualized skeletal structures are stable. IMPRESSION: Mild interstitial edema.  No focal pneumonia. Electronically Signed   By: Abelardo Diesel M.D.   On: 12/07/2015 18:04   Ct Head Wo Contrast  Result Date: 12/07/2015 CLINICAL DATA:  Intracranial hemorrhage, multi organ failure. EXAM: CT HEAD WITHOUT CONTRAST TECHNIQUE: Contiguous axial images were obtained from the base of the skull through the vertex without intravenous contrast. COMPARISON:  CT HEAD December 06, 2014 FINDINGS: BRAIN: Moderate ventriculomegaly on the basis of global parenchymal brain volume loss. Similar LEFT temporal occipital lobe 5.9 x 2.4 cm intraparenchymal hematoma with surrounding low-density vasogenic edema. New blurring of the LEFT medial occipital lobe and LEFT thalamus gray-white  matter junction, contiguous with the posterior aspect of the hematoma. Wedge-like hypodensity RIGHT superior cerebellum. Evolving RIGHT temporal occipital lobe intraparenchymal hematoma. Local sulcal effacement without midline shift. Gyral density RIGHT and appear temporal occipital lobe. Patchy to confluent white matter hypodensities exclusive of the aforementioned abnormality compatible with moderate chronic small vessel ischemic disease. Old LEFT basal ganglia lacunar infarcts. Scattered sub cm parenchymal calcifications can be seen with prior infection such as neurocysticercosis. No abnormal extra-axial fluid collections. Basal cisterns are patent. VASCULAR:  Severe calcific atherosclerosis of the carotid siphons and included vertebral arteries. Scalp vascular calcifications. Dense basilar artery and to lesser extent remaining intracranial vessels. SKULL: No skull fracture. No significant scalp soft tissue swelling. SINUSES/ORBITS: The mastoid air-cells and included paranasal sinuses are well-aerated.Status post RIGHT ocular lens implant. The included ocular globes and orbital contents are non-suspicious. OTHER: Patient is edentulous. 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 Acute RIGHT superior cerebellar artery territory infarct. Evolving bilateral temporal occipital lobe intraparenchymal hematomas. Severe atherosclerosis. Dense basilar artery and to lesser extent additional intracranial vessels most compatible with hemoconcentration though, basilar artery thrombosis is a concern given acute posterior circulation infarcts. These results will be called to the ordering clinician or representative by the Radiologist Assistant, and communication documented in the PACS or zVision Dashboard. Electronically Signed   By: Elon Alas M.D.   On: 12/07/2015 06:49   Ct Head Wo Contrast  Result Date: 12/06/2015 CLINICAL DATA:  Increasing confusion and history of recent CVA EXAM: CT HEAD WITHOUT CONTRAST TECHNIQUE: Contiguous axial images were obtained from the base of the skull through the vertex without intravenous contrast. COMPARISON:  None. FINDINGS: Brain: There are areas of parenchymal hemorrhage identified in the right temporo-occipital region as well as in the left temporo-occipital area inferomedially along the tentorium cerebelli. The right-sided area measures approximately 6.3 cm in greatest dimension. The area on the left measures approximately 5.5 cm greatest dimension. Some superior extension towards the left lateral ventricle is noted with some buckling of the lateral ventricular wall inferiorly although no  definitive intraventricular component of hemorrhage is seen. Some linear areas are noted along the inferior aspect of the hemorrhage on the right suggesting the possibility of such as subarachnoid component. Some mild white matter edema is noted in the left occipital lobe related to the hemorrhage. Mild atrophic changes are seen. Vascular: No hyperdense vessel or unexpected calcification. Skull: Normal. Negative for fracture or focal lesion. Sinuses/Orbits: No acute finding. Other: None. IMPRESSION: Changes consistent with parenchymal hemorrhage in the temporo-occipital regions bilaterally. There is some suggestion of subarachnoid extension along the inferior aspect in the right occipital lobe. Critical Value/emergent results were discussed by telephone at the time of interpretation on 12/06/2015 at 4:45 pm with the admitting physician, who verbally acknowledged these results. Electronically Signed   By: Inez Catalina M.D.   On: 12/06/2015 16:46   Ct Abdomen Pelvis W Contrast  Result Date: 12/06/2015 CLINICAL DATA:  Generalized abdominal pain and distention. EXAM: CT ABDOMEN AND PELVIS WITH CONTRAST TECHNIQUE: Multidetector CT imaging of the abdomen and pelvis was performed using the standard protocol following bolus administration of intravenous contrast. CONTRAST:  151mL ISOVUE-300 IOPAMIDOL (ISOVUE-300) INJECTION 61% COMPARISON:  None. FINDINGS: Lower chest: Visualized lung bases are unremarkable. Hepatobiliary: No gallstones are noted. Nodular hepatic margins are noted concerning for hepatic cirrhosis. No focal abnormality is noted within the liver. Pancreas: Normal. Spleen: Irregular low densities are noted within the spleen which are peripheral and may represent  old infarctions. Adrenals/Urinary Tract: Adrenal glands appear normal. Severe bilateral renal atrophy is noted consistent with history of end-stage renal disease. Stomach/Bowel: There is no evidence of bowel obstruction. Vascular/Lymphatic:  Atherosclerosis of abdominal aorta is noted without aneurysm formation. No significant adenopathy is noted. Reproductive: Uterus and ovaries are unremarkable. Other: Large amount of ascites is noted. Probable large fluid-filled left femoral hernia is noted. 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 IMPRESSION: Nodular hepatic margins are noted concerning for hepatic cirrhosis. Large amount of ascites is noted. Irregular peripheral low densities are noted most consistent with old infarctions. Severe bilateral renal atrophy is noted consistent with history of end-stage renal disease. 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 Aortic atherosclerosis. Probable large fluid-filled left femoral hernia is noted. Electronically Signed   By: Marijo Conception, M.D.   On: 12/06/2015 10:50   US Paracentesis  Result Date: 12/07/2015 INDICATION: End stage renal disease. Cirrhosis. Ascites. Request for diagnostic and therapeutic paracentesis. EXAM: ULTRASOUND GUIDED RIGHT LATERAL ABDOMEN PARACENTESIS MEDICATIONS: 1% Lidocaine. COMPLICATIONS: None immediate. PROCEDURE: Informed written consent was obtained from the patient after a discussion of the risks, benefits and alternatives to treatment. A timeout was performed prior to the initiation of the procedure. Initial ultrasound scanning demonstrates a large amount of ascites within the right lateral abdomen. The right lateral abdomen was prepped and draped in the usual sterile fashion. 1% lidocaine with epinephrine was used for local anesthesia. Following this, a 19 gauge, 7-cm, Yueh catheter was introduced. An ultrasound image was saved for documentation purposes. The paracentesis was performed. The catheter was removed and a dressing was applied. The patient tolerated the procedure well without immediate post procedural complication.  FINDINGS: A total of approximately 5.45 liters of hazy yellow fluid was removed. Samples were sent to the laboratory as requested by the clinical team. IMPRESSION: Successful ultrasound-guided paracentesis yielding 5.45 liters of peritoneal fluid. Read by:  Gareth Eagle, PA-C Electronically Signed   By: Markus Daft M.D.   On: 12/07/2015 13:08   Dg Chest Port 1 View  Result Date: 12/06/2015 CLINICAL DATA:  Sepsis EXAM: PORTABLE CHEST 1 VIEW COMPARISON:  Chest radiograph from earlier today. FINDINGS: Right rotated chest radiograph. Stable cardiomediastinal silhouette with cardiomegaly and aortic atherosclerosis. No pneumothorax. No pleural effusion. Hazy and linear diffuse parahilar opacities in both lungs appear stable. IMPRESSION: Stable cardiomegaly. Hazy and linear diffuse parahilar opacities in both lungs appear stable and could represent mild pulmonary edema or infection. Electronically Signed   By: Ilona Sorrel M.D.   On: 12/06/2015 15:38   Dg Abd Acute W/chest  Result Date: 12/06/2015 CLINICAL DATA:  Abdominal pain.  Dialysis patient. EXAM: DG ABDOMEN ACUTE W/ 1V CHEST COMPARISON:  None. FINDINGS: Right rotated chest radiograph. Mild enlargement of the cardiac silhouette. Mildly tortuous atherosclerotic thoracic aorta. Otherwise normal mediastinal contour. No pneumothorax. No pleural effusion. Mild pulmonary edema. Mild bibasilar scarring versus atelectasis. No disproportionately dilated small bowel loops. Mild-to-moderate stool and gas throughout the colon. No evidence of pneumatosis or pneumoperitoneum. Extensive atherosclerotic calcifications are noted throughout the abdominopelvic vessels. IMPRESSION: 1. Mild enlargement of the cardiac silhouette. Mild pulmonary edema. 2. Mild scarring versus atelectasis of both lung bases. 3. Nonobstructive bowel gas pattern. 4. Aortic atherosclerosis. Electronically Signed   By: Ilona Sorrel M.D.   On: 12/06/2015 09:08    Microbiology: Recent Results (from  the past 240 hour(s))  Culture, blood (routine x  2)     Status: None   Collection Time: 12/06/15 12:58 PM  Result Value Ref Range Status   Specimen Description BLOOD RIGHT WRIST  Final   Special Requests IN PEDIATRIC BOTTLE  2CC  Final   Culture NO GROWTH 5 DAYS  Final   Report Status 12/11/2015 FINAL  Final  Culture, blood (routine x 2)     Status: None   Collection Time: 12/06/15  1:05 PM  Result Value Ref Range Status   Specimen Description BLOOD RIGHT HAND  Final   Special Requests IN PEDIATRIC BOTTLE 2CC  Final   Culture NO GROWTH 5 DAYS  Final   Report Status 12/11/2015 FINAL  Final  Culture, body fluid-bottle     Status: None (Preliminary result)   Collection Time: 12/07/15  2:02 PM  Result Value Ref Range Status   Specimen Description FLUID PERITONEAL  Final   Special Requests NONE  Final   Culture NO GROWTH 4 DAYS  Final   Report Status PENDING  Incomplete  Gram stain     Status: None   Collection Time: 12/07/15  2:02 PM  Result Value Ref Range Status   Specimen Description FLUID PERITONEAL  Final   Special Requests NONE  Final   Gram Stain   Final    MODERATE WBC PRESENT, PREDOMINANTLY PMN NO ORGANISMS SEEN    Report Status 12/07/2015 FINAL  Final     Labs: Basic Metabolic Panel:  Recent Labs Lab 12/06/15 0752 12/06/15 0816 12/07/15 0416 12/08/15 1448 12/09/15 0454  NA 135 134* 138 142 140  K 3.9 3.8 3.7 4.7 4.1  CL 94* 95* 97* 99* 97*  CO2 26  --  27 25 29   GLUCOSE 308* 303* 150* 139* 126*  BUN 16 22* 27* 55* 22*  CREATININE 3.92* 3.90* 5.51* 7.73* 3.72*  CALCIUM 9.2  --  9.8 10.3 10.8*  PHOS  --   --   --  6.8*  --    Liver Function Tests:  Recent Labs Lab 12/06/15 0752 12/07/15 0416 12/08/15 1448  AST 40 32  --   ALT 12* 10*  --   ALKPHOS 86 70  --   BILITOT 0.8 0.9  --   PROT 7.3 6.8  --   ALBUMIN 3.4* 2.9* 2.3*    Recent Labs Lab 12/06/15 0752  LIPASE 23    Recent Labs Lab 12/06/15 1605  AMMONIA 70*   CBC:  Recent  Labs Lab 12/06/15 0752 12/06/15 0816 12/07/15 0416 12/08/15 1433  WBC 9.7  --  9.9 8.5  NEUTROABS 7.7  --   --   --   HGB 12.6 13.9 12.1 11.3*  HCT 38.4 41.0 36.7 34.2*  MCV 99.5  --  98.7 99.1  PLT 259  --  215 246   Cardiac Enzymes:  Recent Labs Lab 12/06/15 1948 12/06/15 2230 12/07/15 0416  TROPONINI 0.24* 1.01* 2.17*   BNP: BNP (last 3 results)  Recent Labs  12/06/15 1948  BNP 4,306.8*    ProBNP (last 3 results) No results for input(s): PROBNP in the last 8760 hours.  CBG:  Recent Labs Lab 12/08/15 0615 12/08/15 1152 12/08/15 1815 12/08/15 2134 12/09/15 0651  GLUCAP 140* 151* 114* 108* 131*

## 2015-12-13 NOTE — Consult Note (Signed)
[facility] [employer] Received request from [facility] for family interest in [facility] transfer today. Chart reviewed and met with family yesterday and today. Son [place] completed paper work for transfer today. Dr. Orpah Melter to assume care per family request.   Please fax discharge summary to [phone].  RN please call report to [phone].  Thank you, Erling Conte, LCSW [phone]

## 2015-12-13 DEATH — deceased

## 2015-12-15 ENCOUNTER — Ambulatory Visit: Payer: Self-pay | Admitting: Vascular Surgery

## 2016-02-20 ENCOUNTER — Telehealth: Payer: Self-pay | Admitting: Internal Medicine

## 2016-02-20 NOTE — Telephone Encounter (Signed)
Contacted patient to schedule medicare wellness and was informed patient expired December 15, 2015, please note.

## 2016-02-21 NOTE — Telephone Encounter (Signed)
FYI

## 2016-02-21 NOTE — Telephone Encounter (Signed)
Noted, so sorry

## 2016-02-21 NOTE — Telephone Encounter (Signed)
Noted, to PCP for FYI. 

## 2016-04-08 ENCOUNTER — Other Ambulatory Visit: Payer: Self-pay

## 2016-04-08 ENCOUNTER — Ambulatory Visit: Payer: Self-pay | Admitting: Oncology
